# Patient Record
Sex: Male | Born: 1935 | Race: White | Hispanic: No | Marital: Married | Smoking: Former smoker
Health system: Southern US, Community
[De-identification: ages and names within clinical notes are randomized; demographics above are authoritative.]

## PROBLEM LIST (undated history)

## (undated) DIAGNOSIS — S72009A Fracture of unspecified part of neck of unspecified femur, initial encounter for closed fracture: Secondary | ICD-10-CM

## (undated) DIAGNOSIS — I10 Essential (primary) hypertension: Secondary | ICD-10-CM

## (undated) DIAGNOSIS — I509 Heart failure, unspecified: Secondary | ICD-10-CM

## (undated) DIAGNOSIS — I2510 Atherosclerotic heart disease of native coronary artery without angina pectoris: Secondary | ICD-10-CM

## (undated) DIAGNOSIS — F32A Depression, unspecified: Secondary | ICD-10-CM

## (undated) DIAGNOSIS — M1990 Unspecified osteoarthritis, unspecified site: Secondary | ICD-10-CM

## (undated) DIAGNOSIS — I214 Non-ST elevation (NSTEMI) myocardial infarction: Secondary | ICD-10-CM

## (undated) DIAGNOSIS — E7800 Pure hypercholesterolemia, unspecified: Secondary | ICD-10-CM

## (undated) DIAGNOSIS — D649 Anemia, unspecified: Secondary | ICD-10-CM

## (undated) DIAGNOSIS — K9189 Other postprocedural complications and disorders of digestive system: Secondary | ICD-10-CM

## (undated) DIAGNOSIS — G8929 Other chronic pain: Secondary | ICD-10-CM

## (undated) DIAGNOSIS — T148XXA Other injury of unspecified body region, initial encounter: Secondary | ICD-10-CM

## (undated) DIAGNOSIS — Z87891 Personal history of nicotine dependence: Secondary | ICD-10-CM

## (undated) DIAGNOSIS — R931 Abnormal findings on diagnostic imaging of heart and coronary circulation: Secondary | ICD-10-CM

## (undated) DIAGNOSIS — K567 Ileus, unspecified: Secondary | ICD-10-CM

## (undated) DIAGNOSIS — I4510 Unspecified right bundle-branch block: Secondary | ICD-10-CM

## (undated) DIAGNOSIS — D509 Iron deficiency anemia, unspecified: Secondary | ICD-10-CM

## (undated) DIAGNOSIS — F329 Major depressive disorder, single episode, unspecified: Secondary | ICD-10-CM

## (undated) DIAGNOSIS — I499 Cardiac arrhythmia, unspecified: Secondary | ICD-10-CM

## (undated) DIAGNOSIS — M549 Dorsalgia, unspecified: Secondary | ICD-10-CM

## (undated) DIAGNOSIS — K5909 Other constipation: Secondary | ICD-10-CM

## (undated) DIAGNOSIS — E871 Hypo-osmolality and hyponatremia: Secondary | ICD-10-CM

## (undated) DIAGNOSIS — K7460 Unspecified cirrhosis of liver: Secondary | ICD-10-CM

## (undated) DIAGNOSIS — R002 Palpitations: Secondary | ICD-10-CM

## (undated) DIAGNOSIS — C679 Malignant neoplasm of bladder, unspecified: Secondary | ICD-10-CM

## (undated) DIAGNOSIS — L0390 Cellulitis, unspecified: Secondary | ICD-10-CM

## (undated) DIAGNOSIS — E785 Hyperlipidemia, unspecified: Secondary | ICD-10-CM

## (undated) HISTORY — DX: Depression, unspecified: F32.A

## (undated) HISTORY — PX: ANKLE SURGERY: SHX546

## (undated) HISTORY — DX: Pure hypercholesterolemia, unspecified: E78.00

## (undated) HISTORY — DX: Major depressive disorder, single episode, unspecified: F32.9

## (undated) HISTORY — DX: Atherosclerotic heart disease of native coronary artery without angina pectoris: I25.10

## (undated) HISTORY — DX: Other postprocedural complications and disorders of digestive system: K91.89

## (undated) HISTORY — PX: HERNIA REPAIR: SHX51

## (undated) HISTORY — DX: Other injury of unspecified body region, initial encounter: T14.8XXA

## (undated) HISTORY — DX: Cardiac arrhythmia, unspecified: I49.9

## (undated) HISTORY — DX: Unspecified osteoarthritis, unspecified site: M19.90

## (undated) HISTORY — PX: HIP SURGERY: SHX245

## (undated) HISTORY — DX: Iron deficiency anemia, unspecified: D50.9

## (undated) HISTORY — DX: Other constipation: K59.09

## (undated) HISTORY — DX: Malignant neoplasm of bladder, unspecified: C67.9

## (undated) HISTORY — DX: Anemia, unspecified: D64.9

## (undated) HISTORY — DX: Essential (primary) hypertension: I10

## (undated) HISTORY — DX: Heart failure, unspecified: I50.9

## (undated) HISTORY — DX: Fracture of unspecified part of neck of unspecified femur, initial encounter for closed fracture: S72.009A

## (undated) HISTORY — DX: Abnormal findings on diagnostic imaging of heart and coronary circulation: R93.1

## (undated) HISTORY — DX: Personal history of nicotine dependence: Z87.891

## (undated) HISTORY — DX: Dorsalgia, unspecified: M54.9

## (undated) HISTORY — DX: Ileus, unspecified: K56.7

## (undated) HISTORY — DX: Hyperlipidemia, unspecified: E78.5

## (undated) HISTORY — DX: Hypo-osmolality and hyponatremia: E87.1

## (undated) HISTORY — DX: Other chronic pain: G89.29

## (undated) HISTORY — DX: Unspecified right bundle-branch block: I45.10

## (undated) HISTORY — DX: Palpitations: R00.2

## (undated) HISTORY — DX: Cellulitis, unspecified: L03.90

---

## 1998-03-23 ENCOUNTER — Other Ambulatory Visit: Admission: RE | Admit: 1998-03-23 | Discharge: 1998-03-23 | Payer: Self-pay | Admitting: Cardiology

## 2003-09-06 ENCOUNTER — Encounter (INDEPENDENT_AMBULATORY_CARE_PROVIDER_SITE_OTHER): Payer: Self-pay

## 2003-09-06 ENCOUNTER — Inpatient Hospital Stay (HOSPITAL_COMMUNITY): Admission: EM | Admit: 2003-09-06 | Discharge: 2003-09-20 | Payer: Self-pay

## 2004-02-08 ENCOUNTER — Inpatient Hospital Stay (HOSPITAL_COMMUNITY): Admission: EM | Admit: 2004-02-08 | Discharge: 2004-02-12 | Payer: Self-pay | Admitting: Emergency Medicine

## 2004-02-09 ENCOUNTER — Encounter: Payer: Self-pay | Admitting: Cardiology

## 2009-11-16 ENCOUNTER — Ambulatory Visit (HOSPITAL_COMMUNITY)
Admission: RE | Admit: 2009-11-16 | Discharge: 2009-11-17 | Payer: Self-pay | Source: Home / Self Care | Admitting: Urology

## 2009-11-16 ENCOUNTER — Encounter (INDEPENDENT_AMBULATORY_CARE_PROVIDER_SITE_OTHER): Payer: Self-pay | Admitting: Urology

## 2010-05-15 ENCOUNTER — Ambulatory Visit (HOSPITAL_COMMUNITY): Admission: RE | Admit: 2010-05-15 | Discharge: 2010-05-16 | Payer: Self-pay | Admitting: Urology

## 2010-08-01 ENCOUNTER — Ambulatory Visit: Payer: Self-pay | Admitting: Cardiology

## 2010-11-13 ENCOUNTER — Ambulatory Visit: Payer: Self-pay | Admitting: Cardiology

## 2011-01-06 LAB — SURGICAL PCR SCREEN
MRSA, PCR: NEGATIVE
Staphylococcus aureus: POSITIVE — AB

## 2011-01-06 LAB — CBC
HCT: 36.4 % — ABNORMAL LOW (ref 39.0–52.0)
Hemoglobin: 11.7 g/dL — ABNORMAL LOW (ref 13.0–17.0)
MCH: 24.3 pg — ABNORMAL LOW (ref 26.0–34.0)
MCHC: 32.1 g/dL (ref 30.0–36.0)
MCV: 75.6 fL — ABNORMAL LOW (ref 78.0–100.0)
Platelets: 174 10*3/uL (ref 150–400)
RBC: 4.81 MIL/uL (ref 4.22–5.81)
RDW: 16.9 % — ABNORMAL HIGH (ref 11.5–15.5)
WBC: 5.4 10*3/uL (ref 4.0–10.5)

## 2011-01-07 LAB — BASIC METABOLIC PANEL
BUN: 16 mg/dL (ref 6–23)
CO2: 31 mEq/L (ref 19–32)
Calcium: 9.5 mg/dL (ref 8.4–10.5)
Chloride: 101 mEq/L (ref 96–112)
Creatinine, Ser: 1.04 mg/dL (ref 0.4–1.5)
GFR calc Af Amer: >60 mL/min (ref >60)
GFR calc non Af Amer: >60 mL/min (ref >60)
Glucose, Bld: 103 mg/dL — ABNORMAL HIGH (ref 70–99)
Potassium: 4.9 mEq/L (ref 3.5–5.1)
Sodium: 139 mEq/L (ref 135–145)

## 2011-01-07 LAB — CBC
HCT: 38.7 % — ABNORMAL LOW (ref 39.0–52.0)
Hemoglobin: 12.3 g/dL — ABNORMAL LOW (ref 13.0–17.0)
MCHC: 31.7 g/dL (ref 30.0–36.0)
MCV: 77.3 fL — ABNORMAL LOW (ref 78.0–100.0)
Platelets: 170 10*3/uL (ref 150–400)
RBC: 5.01 MIL/uL (ref 4.22–5.81)
RDW: 15.6 % — ABNORMAL HIGH (ref 11.5–15.5)
WBC: 5.3 10*3/uL (ref 4.0–10.5)

## 2011-02-27 ENCOUNTER — Telehealth: Payer: Self-pay | Admitting: Cardiology

## 2011-02-27 NOTE — Telephone Encounter (Signed)
If you have any questions please call [phone]. The three prescriptions go ahead and fax in the order. Call into the drug store for a 14 day supply for the Limbotrol and the potassium. He takes two pills a day (morning and night) of each. If this doesn't make sense please call back.

## 2011-02-27 NOTE — Telephone Encounter (Signed)
Will fill when Dr. Patty Sermons is back Monday secondary to the limbitrol

## 2011-03-05 ENCOUNTER — Other Ambulatory Visit: Payer: Self-pay | Admitting: *Deleted

## 2011-03-05 DIAGNOSIS — I509 Heart failure, unspecified: Secondary | ICD-10-CM

## 2011-03-05 DIAGNOSIS — F419 Anxiety disorder, unspecified: Secondary | ICD-10-CM

## 2011-03-05 MED ORDER — CHLORDIAZEPOXIDE-AMITRIPTYLINE 10-25 MG PO TABS: 1.0000 | ORAL_TABLET | Freq: Two times a day (BID) | ORAL | Status: DC

## 2011-03-05 MED ORDER — NITROGLYCERIN 0.4 MG/HR TD PT24: 1.0000 | MEDICATED_PATCH | Freq: Every day | TRANSDERMAL | Status: DC

## 2011-03-05 MED ORDER — POTASSIUM CHLORIDE 10 MEQ PO TBCR: 10.0000 meq | EXTENDED_RELEASE_TABLET | Freq: Two times a day (BID) | ORAL | Status: DC

## 2011-03-05 NOTE — Telephone Encounter (Signed)
Faxed refills for KCL,Minitran patch,limbitrol to [facility]

## 2011-03-09 NOTE — Discharge Summary (Signed)
NAME:  Trevor [name], Trevor [name]                        ACCOUNT NO.:  [id number]   MEDICAL RECORD NO.:  [id number]                   PATIENT TYPE:  INP   LOCATION:  0470                                 FACILITY:  [facility]   PHYSICIAN:  Almedia Balls. Ranell Patrick, M.D.              DATE OF BIRTH:  01-20-1925   DATE OF ADMISSION:  02/08/2004  DATE OF DISCHARGE:  02/12/2004                                 DISCHARGE SUMMARY   ADMISSION DIAGNOSES:  1. Chronic cellulitis right ankle.  2. Hypertension.  3. History of myocardial infarction.  4. Anemia.   DISCHARGE DIAGNOSES:  1. Cellulitis right ankle status post hardware removal and irrigation and     debridement.  2. Hypertension.  3. History of myocardial infarction.  4. Anemia.   MEDICATIONS:  1. Keflex 500 mg q.6 h.  2. Vicodin 5 mg q.4-6 h p.r.n.  3. Robaxin 500 mg q.6 h.  4. Lipitor.  5. Hydrochlorothiazide.  6. Atenolol.  7. Aspirin.  8. Vitamins.  9. Potassium.   ALLERGIES:  MORPHINE SULFATE.   BRIEF HISTORY:  Trevor [name] is a 77 year old male comes into the emergency  room complaining about right ankle pain and some chronic cellulitis.  The  patient had had an ankle fusion status post a right ankle fracture.  He was  complaining about some chronic cellulitis especially medially since that  time.  The patient denies any decrease in appetite or fevers or chronic  illnesses, just only the chronic nonhealing wound to his right ankle.  The  patient has been treated for an episode previously within the past.   LABORATORY DATA:  On April 19th white blood count was 10.6, H&H was 10.5 and  32.2 and platelet count was 204.  Chemistries:  Sodium 136, potassium 3.8,  chloride 106, bicarb 24, BUN 13, creatinine 1, glucose 117.  On April 21st  white blood count was 9, H&H was 10.3 and 32.3 and a glucose was 194.  Chemistries:  Sodium 132, potassium 3.9, chloride 104, bicarb 26, BUN 10,  creatinine 1.2 and a glucose of 122.   PROCEDURE:  The  patient had a right ankle irrigation and debridement and  also hardware removal on February 09, 2004.  Surgeon was Dr. Malon Kindle,  assistant Alphonsa Overall, P.A.-C.  Spinal anesthesia was used.  Fluid  replacement was 200 mL.  Minimal blood loss.  Cultures and gram stains were  sent.  Hardware was removed.  Perioperative antibiotics were given.   HOSPITAL COURSE:  The patient presented to the emergency room on February 08, 2004 complaining about right ankle pain and right ankle cellulitis that has  been going on chronically.  The patient was admitted for I&D of this wound  and possible hardware removal.  The patient has had a previous episode in  the past of cellulitis in the nonhealing wound but no attempt at removing  the hardware has been completed.  The patient had the above-stated procedure  on February 09, 2004 with no complication.  The patient tolerated the procedure  well and was transported up to [facility] [place] after an adequate time in the  postanesthesia care unit.  Cardiology assisted [place] with management of this  patient due to his history of previous MI around surgery.  The patient had  negative cardiac labs essentially and also a negative 2-D echo only showing  his old MI.  The patient remained afebrile and alert and oriented throughout  his stay and his labs were monitored.  Dressing changes were completed  daily.  The patient tolerated his hospital stay very well.  The patient was  given a wheelchair for home use and discharged with nonweightbearing for his  right lower extremity with some occupational therapy and physical therapy  help.   DISCHARGE PLANNING:  The patient was discharged on February 12, 2004.   CONDITION:  Improved.   DIET:  Regular.   ACTIVITY:  Nonweightbearing to right lower extremity until follow up.   FOLLOW UP:  In 7 to 10 days with Dr. Ranell Patrick in his office.   CONSULTATIONS:  Cardiology, Dr. Quita Skye. [place].     Thomas B. Durwin Nora, P.A.                      Almedia Balls. Ranell Patrick, M.D.    TBD/MEDQ  D:  03/15/2004  T:  03/15/2004  Job:  [id number]

## 2011-03-09 NOTE — Consult Note (Signed)
NAME:  Trevor [name], Trevor [name]                        ACCOUNT NO.:  [id number]   MEDICAL RECORD NO.:  [id number]                   PATIENT TYPE:  INP   LOCATION:  0470                                 FACILITY:  [facility]   PHYSICIAN:  Quita Skye. Waldon Reining, MD             DATE OF BIRTH:  [birth year]   DATE OF CONSULTATION:  02/08/2004  DATE OF DISCHARGE:                                   CONSULTATION   Trevor [name] is a 77 year old white man who is being admitted for surgical  treatment of an infected, chronic nonhealing right ankle wound.  Cardiology  consultation was requested prior to surgery.   The patient has a history of having suffered an inferior myocardial  infarction in 1984.  This was complicated by hypotension and repeated  episodes of ventricular fibrillation requiring electrical cardioversion.  During the ensuing years he did well from a cardiac standpoint, experiencing  infrequent episodes of chest pain.  In November 2004 he was hospitalized for  a strangulated right inguinal hernia.  He underwent exploratory laparotomy,  partial right colectomy, omentectomy, and a right inguinal hernia repair.  He suffered a non-ST segment elevation myocardial infarction  postoperatively.  He has continued to do well from a cardiac standpoint  since then.  He continues to experience infrequent episodes of chest pain  relieved with nitroglycerin.   The patient has no history of congestive heart failure or arrhythmia.   He does have a history of hypertension and dyslipidemia, both of which are  being treated.  There is no history of diabetes mellitus.  He stopped  smoking 20 years ago.   The patient is on a number of medications.  These include Lipitor,  hydrochlorothiazide, atenolol, aspirin, and potassium chloride.   He is reportedly allergic to MORPHINE.   The patient is retired.  He lives with his wife.  He drinks an occasional  glass of wine.   Previous operations besides those  described above include bilateral hernia  repairs, right hip surgery, and right ankle surgery.   The patient experienced a single, brief (two to three minutes) episode of  chest pain this evening, relieved with one nitroglycerin tablet.   REVIEW OF SYSTEMS:  No new problems related to head, eyes, ears, nose,  mouth, throat, lungs, gastrointestinal system, genitourinary system, or  extremities.  There is no history of neurologic or psychiatric disorder.  There is no history of fever, chills, or weight loss.   PHYSICAL EXAMINATION:  VITAL SIGNS:  Blood pressure 106/62.  Pulse 81 and  regular.  Respirations 20.  Temperature 99.3  GENERAL:  The patient was an older white man in no discomfort.  He was  alert, oriented, appropriate, and responsive.  HEENT:  Head, eyes, nose, and mouth were normal.  NECK:  Without thyromegaly or adenopathy.  Carotid pulses were palpable  bilaterally and without bruits.  CARDIAC:  Normal S1 and S2.  There was no S3,  S4, murmur, rub, or click.  Cardiac rhythm was regular.  No chest wall tenderness was noted.  CHEST:  The lungs were clear.  ABDOMEN:  Soft and nontender.  There was no mass, hepatosplenomegaly, bruit,  distention, rebound, guarding, or rigidity.  Bowel sounds were normal.  RECTAL, GENITAL:  Exams were not performed as they were not pertinent to the  reason for acute care hospitalization.  EXTREMITIES:  The left lower extremity was without deviation or deformity.  Dorsalis pedal pulse was palpable.  Radial pulses were palpable bilaterally.  The right foot was not examined as it was bandaged.   Electrocardiogram revealed normal sinus rhythm with occasional PVCs.  There  was evidence of a prior inferior myocardial infarction.  There were  nonspecific ST and T-wave changes in the anterolateral leads.  The tracing  was otherwise unremarkable.  Chest radiograph report was pending at the time  of this dictation.  White count was 10.6 with a hemoglobin  of 10.5 and a  hematocrit of 32.2.  Potassium 3.8 with a BUN of 13 and creatinine of 1.0.  The remaining studies were pending at the time of dictation.   IMPRESSION:  1. Coronary artery disease.  Status post inferior myocardial infarction in     1984.  Status post postoperative non-Q-wave myocardial infarction in     November 2004 following exploratory laparotomy.  2. Hypertension.  3. Dyslipidemia.  4. Infected, chronic nonhealing wound, right ankle.  5. Chronic anemia.   RECOMMENDATIONS:  1. The patient is at an increased risk of cardiovascular complications from     the planned surgery; however, there is no evidence of ischemia,     congestive heart failure, or arrhythmia/conduction disturbance at this     time.  2. Nitrol paste.  3. Additional beta blocker.  4. Maintain potassium between 4.0 and 5.0.  5. Careful fluid monitoring.  6. Echocardiogram to assess left ventricular function.  7. Postop cardiac enzymes.   Dr. Patty Sermons will follow the patient with you.                                               Quita Skye. Waldon Reining, MD    MSC/MEDQ  D:  02/08/2004  T:  02/09/2004  Job:  [id number]   cc:   Cassell Clement, M.D.  [address], [address]  [place]  [place] [address]  Fax: [phone]   Almedia Balls. Ranell Patrick, M.D.  [facility]  [address]  [address]  [place]  [place] [address]  Fax: [phone]

## 2011-03-09 NOTE — Op Note (Signed)
NAME:  Trevor [name], Trevor [name]                        ACCOUNT NO.:  [id number]   MEDICAL RECORD NO.:  [id number]                   PATIENT TYPE:  INP   LOCATION:  0357                                 FACILITY:  [facility]   PHYSICIAN:  Adolph Pollack, M.D.            DATE OF BIRTH:  1925-03-25   DATE OF PROCEDURE:  09/06/2003  DATE OF DISCHARGE:                                 OPERATIVE REPORT   PREOPERATIVE DIAGNOSIS:  Incarcerated, recurrent right inguinal hernia.   POSTOPERATIVE DIAGNOSIS:  Strangulated recurrent right inguinal hernia (part  of ascending colon).   PROCEDURE:  1. Exploratory laparotomy with partial right colectomy.  2. Repair of recurrent right inguinal hernia with AlloDerm graft.   SURGEON:  Adolph Pollack, M.D.   ASSISTANT:  Kristine Garbe. Ezzard Standing, M.D.   ANESTHESIA:  General.   FINDINGS:  A recurrent right inguinal hernia, proximal half of the right  colon, which had patchy areas of infarction and strangulation.   INDICATIONS:  This 77 year old male has a history of recurrent right  inguinal hernias.  He has a known recurrence and then got more painful and  swollen and red over the weekend.  He now presents for emergency repair of  this incarcerated hernia.   TECHNIQUE:  He was brought to the operating room and placed supine on the  operating table, and general anesthetic was administered.  The Foley  catheter was placed in his bladder.  The abdominal wall and scrotal area  were sterilely prepped and draped.  A previous right groin incision was re  incised through the skin and subcutaneous tissue and attenuated external  oblique aponeurosis, and underneath, a large, firm mass was noted.  I  dissected some adhesive tissue away from this.  I then entered the sac and  had dark, bloody fluid evacuated from it.  There appeared to be some  incarcerated omentum.  I then felt the hernia defect and opened it up a  little bit medially.  I then reduced what appeared  to be the proximal half  of the right colon, which was purple and had black areas consistent with  patchy infarction.  I was unable to completely redo this back in the  abdominal cavity but decided I was going to need to do a partial bowel  resection.   At this time, I went ahead and performed a lower midline incision through  the skin and subcutaneous tissue, fascia, and peritoneum.  Omental adhesions  were taken down with electrocautery.  I then was able to reduce the bowel  portion of the hernia into the peritoneal cavity.  The omental portion that  was infarcted, I went ahead and resected this and sent it as a partial  omentectomy specimen.   I then mobilized the distal ileum and the ascending colon.  The proximal  half of the ascending colon had evidence of infarction.  I went ahead and  divided  the ileum just proximal to the ileocecal valve and divided the right  colon at its midportion at a viable area.  The mesentery was then divided  between clamps and the vessels ligated.  The specimen was sent out to  pathology, along with part of the omentum.  A side-to-side stapled  anastomosis was then performed with the endo-GIA stapler.  The remaining  enterotomy was closed adequately with a linear non cutting stapler.  The  mesenteric defect was closed with interrupted 3-0 Vicryl sutures.  The  anastomosis was patent, viable, and under no tension.  The distal staple  area was reinforced with a single 3-0 Vicryl suture.   At this point, gloves were changed.  The abdominal cavity was irrigated with  2 liters of saline solution, and hemostasis was adequate.  I then re  approached the hernia site.  I was able to identify the pubic tubercle, the  symphysis pubis, and Cooper's ligament.  I then performed a Bassini repair  by approximating the shelving edge of the inguinal ligament and Cooper's  ligament to the internal oblique fascia and muscle, attenuating  transversalis fascia with  interrupted 2-0 Prolene sutures.  I tacked down  the internal ring.  I had identified some of the cord structures.  I then  placed a piece of AlloDerm graft, measuring 4 cm x 12 cm initially and  stretched it out.  I placed it as an onlay over the primary repair with a  running 2-0 Prolene suture, anchoring it to the symphysis pubis and then to  the internal oblique muscle and the external oblique aponeurosis/inguinal  ligament area near the shelving edge with a running 2-0 Prolene suture.  This provided more adequate coverage.   Following this, I irrigated out this wound.  I closed the Scarpa's fascia  over the mesh with a running 3-0 Vicryl suture.  I then made sure that the  sponge, needle, and instrument counts were correct.  I closed the midline  wound fascia with a running #1 PDS suture.  Both wounds were irrigated in  the subcutaneous space, and the skin of both wounds was closed with staples.  A sterile dressing was applied.   He tolerated the procedure well without any apparent complications.  He  subsequently was taken to the recovery room in satisfactory condition.                                               Adolph Pollack, M.D.    Kari Baars  D:  09/06/2003  T:  09/06/2003  Job:  [id number]   cc:   Cassell Clement, M.D.  [address]  [place]  [place] [address]  Fax: [phone]

## 2011-03-09 NOTE — H&P (Signed)
NAME:  Trevor [name], Trevor [name]                        ACCOUNT NO.:  [id number]   MEDICAL RECORD NO.:  [id number]                   PATIENT TYPE:  AMB   LOCATION:  OMED                                 FACILITY:  [facility]   PHYSICIAN:  Adolph Pollack, M.D.            DATE OF BIRTH:  February 02, 1925   DATE OF ADMISSION:  09/06/2003  DATE OF DISCHARGE:                                HISTORY & PHYSICAL   REASON FOR ADMISSION:  Incarcerated right inguinal hernia.   HISTORY OF PRESENT ILLNESS:  Trevor [name] is a 77 year old male who has had  two previous right inguinal hernia repairs.  He had known about this  recurrence for some time, but over the weekend he said that it got more firm  and extremely painful.  He presented to Dr. Patty Sermons today, who sent him up  to our office and now he is sent to [facility] because of  incarcerated hernia with erythematous changes.  He has had some abdominal  pain with this.  No definite nausea or vomiting.  He has been constipated  since Saturday and passed just a little gas.  He had some chills last night.   PAST MEDICAL HISTORY:  1. Hypertension.  2. Coronary artery disease.  3. Myocardial infarction.  4. Arthritis.  5. Chronic nonhealing wound of the right ankle.  6. Hypercholesterolemia.   PREVIOUS OPERATIONS:  1. Bilateral hernia repairs.  2. Repair of recurrent right inguinal hernia.  3. Right hip surgery.  4. ORIF of right ankle fracture.   ALLERGIES:  He is SENSITIVE TO MORPHINE.   MEDICATIONS:  1. __________M-NIP b.i.d.  2. Atenolol.  3. Hydrochlorothiazide.  4. Nitroglycerin patch q.24h.  5. Aspirin daily.  6. Fosamax weekly.  7. Nitroglycerin tablets p.r.n.  8. Lipitor.  9. Ecotrin.   SOCIAL HISTORY:  He used to smoke tobacco, a pack a day, but quit  approximately 20 years ago.  He occasionally has a glass of wine.  He is  married.   REVIEW OF SYSTEMS:  CARDIOVASCULAR:  He has not had any problem with is  heart or chest  pain.  PULMONARY:  No asthma, COPD, or pneumonia.  NEUROLOGIC:  No strokes or seizures.  ENDOCRINE:  No diabetes or thyroid  disease.  GASTROINTESTINAL:  No hepatitis, peptic ulcer disease, or  diverticulitis.  GENITOURINARY:  He sometimes has a little trouble starting  his stream, but no known BPH.   PHYSICAL EXAMINATION:  GENERAL APPEARANCE:  An elderly male who appears to  be somewhat uncomfortable, but is pleasant and cooperative.  VITAL SIGNS:  The temperature is 99.2 degrees, the blood pressure is 124/76,  the pulse is 69, and the respiratory rate is 20.  HEENT:  Eyes:  Extraocular motions intact.  No icterus.  NECK:  Supple without masses or obvious thyroid enlargement.  RESPIRATORY:  Breath sounds equal and clear.  Respirations unlabored.  CARDIOVASCULAR:  The heart demonstrates  a regular rate and rhythm.  No  murmurs heard.  ABDOMEN:  Soft.  It is scaphoid.  There is mild lower abdominal tenderness  and hypoactive bowel sounds noted.  GENITOURINARY:  There is a large swelling that is painful, red, and  irreducible in the right groin area with a large right scar.  There is also  a left groin scar.  The right testicle is riding somewhat higher in the  scrotum.  The left testicle is down.  EXTREMITIES:  On the medial aspect of the right ankle there is a small dried  wound with a bandage on it.  No edema noted.   IMPRESSION:  1. Incarcerated right inguinal hernia.  2. Coronary artery disease.  3. Hypertension.   PLAN:  To the operating room for emergency repair of an incarcerated right  inguinal hernia and possible bowel resection if strangulation is present.  I  did go over the procedure and the risks with the patient and his wife.  The  risks include, but are not limited to bleeding, infection, recurrence of the  hernia, accidental nerve damage and numbness or pain, the possibility of  right orchiectomy, the possibility of bowel resection, and cardiopulmonary  complications  of anesthesia.  He seems to understand all of these and is  agreeable to proceeding.                                               Adolph Pollack, M.D.    Kari Baars  D:  09/06/2003  T:  09/06/2003  Job:  [id number]

## 2011-03-09 NOTE — Op Note (Signed)
NAME:  Trevor [name], HERITAGE                        ACCOUNT NO.:  [id number]   MEDICAL RECORD NO.:  [id number]                   PATIENT TYPE:  INP   LOCATION:  0470                                 FACILITY:  [facility]   PHYSICIAN:  Almedia Balls. Ranell Patrick, M.D.              DATE OF BIRTH:  03-06-1925   DATE OF PROCEDURE:  02/09/2004  DATE OF DISCHARGE:                                 OPERATIVE REPORT   PREOPERATIVE DIAGNOSIS:  Right ankle infection with retained hardware.   POSTOPERATIVE DIAGNOSIS:  Right ankle infection with retained hardware.   PROCEDURE:  Right ankle incision and drainage and hardware removal.   SURGEON:  Almedia Balls. Ranell Patrick, M.D.   ASSISTANT:  Donnie Coffin. Dixon, PA-C   ANESTHESIA:  Spinal.   ESTIMATED BLOOD LOSS:  Minimal.   TOURNIQUET TIME:  Not used.   FLUID REPLACEMENT:  Crystalloid 200 cc.   INSTRUMENT COUNTS:  Correct.   COMPLICATIONS:  None.   INDICATIONS:  Patient is a 77 year old male who presents with fluctuant,  erythematous, painful ankle.  Patient has had a prior ankle surgery,  including ankle fusion and has retained an 18-gauge stainless steel wire  present over the medial malleolus.  This appears to be irritating and  potentially undermining the skin and causing recurrent infections that he  has had over the past several years.  After discussing this with the family  and patient, in light of the patient's high temperature, we would like to  proceed with cardiac clearance and approval to surgery for I&D and hardware  removal.  Patient signed a consent form.   DESCRIPTION OF PROCEDURE:  After an adequate level of anesthesia was  achieved, the patient was positioned supinely on the operating room table.  We did use spinal anesthesia.  The patient remained stable during surgery.  A sterile, nonsurgical tourniquet was placed in the right proximal thigh.  The right leg was thoroughly prepped and draped.  A longitudinal skin  incision was created and  incorporated into the patient's undermined skin  over the medial malleolus.  This measured about 2 inches.  This was taken  sharply down to the bone.  There was gross purulence noted in the region of  the hardware.  This was cultured, Gram's stain culture.  We then thoroughly  irrigated and debrided any nonviable tissue and removed the stainless steel  hardware, took an x-ray, verified it was out, and then closed about half the  incision and packed the rest of it with saline-soaked gauze.  This was a  moist-to-dry dressing,  followed by a short leg splint, posterior splint with no pressure over the  medial side.  The patient's skin actually looked quite good after finishing  this.  There was no skin defect noted.  We are hopeful we will not have skin  complications.  We are going to be following closely the cultures and the  Gram's stain.  Almedia Balls. Ranell Patrick, M.D.    SRN/MEDQ  D:  02/09/2004  T:  02/10/2004  Job:  [id number]

## 2011-03-09 NOTE — Discharge Summary (Signed)
NAME:  [name], Trevor                        ACCOUNT NO.:  [id number]   MEDICAL RECORD NO.:  [id number]                   PATIENT TYPE:  INP   LOCATION:  0364                                 FACILITY:  [facility]   PHYSICIAN:  Adolph Pollack, M.D.            DATE OF BIRTH:  [birth year]   DATE OF ADMISSION:  09/06/2003  DATE OF DISCHARGE:  09/20/2003                                 DISCHARGE SUMMARY   PRINCIPAL DISCHARGE DIAGNOSIS:  Strangulated recurrent right inguinal  hernia.   SECONDARY DIAGNOSES:  1. Postoperative myocardial infarction.  2. Postoperative ileus.  3. Hypertension.  4. Coronary artery disease.  5. Chronic non-healing wound, right ankle.  6. Hypercholesterolemia.  7. Hyponatremia.  8. Postoperative hypovolemia.  9. Wound infection.  10.      Hypokalemia.  11.      Deconditioned state.  12.      Acute blood loss anemia.   REASON FOR ADMISSION:  This is a 77 year old male with two previous right  inguinal hernia repairs.  He had known recurrence and had been delaying  getting it fixed.  He noticed increased firmness and pain, saw Dr. Patty Sermons  in the office, and was felt to have an incarcerated hernia, and was sent to  [facility].  He was certainly having an incarcerated right inguinal hernia,  and was admitted and taken to the operating room.   HOSPITAL COURSE:  In the operating room, he was found to have a strangulated  recurrent right inguinal hernia.  He underwent an exploratory laparotomy,  partial right colectomy, and omentectomies, and a right inguinal hernia  repair with an AlloDerm graft.  Postoperatively, he was somewhat hypovolemic  and he was given a fluid bolus, mobilized.  He was started on some liquids  slowly.  The patient had some ventricular tachycardia noted, and a cardiac  panel was performed.  A significant increase in his troponin was noted.  It  appeared he had a perioperative MI.  Dr. Ronny Flurry was called to see him  and he was  started on Lovenox and treated medically for this.  He continued  to have somewhat of a postoperative ileus; however, he was stable enough to  be transferred from the subacute care unit to [facility].  He was fairly  weak and the ileus slowly resolved.  His diet was advanced by the sixth  postoperative day.  He was noticed to start having some fever, and upon  examination the midline wound appeared edematous.  The wound was opened up  and a purulent material drained out.  Dressing changes and IV antibiotics  were started.  He continued to be somewhat deconditioned, so we gently  increased his activity.  Dressing changes were occurring.  His IV antibiotic  was eventually able to be discontinued.  He continued to gain some strength,  and by his fourteenth postoperative day he felt much better and was ready to  be discharged.   DISPOSITION:  Discharged to home on postoperative day #14.  He is on a beta  blocker at home and aspirin, and he will continue on this as well as with  his nitroglycerin patch and his hydrochlorothiazide.  He is given strict  activity  restrictions, and nurse will come and help him change his dressings.  He  will see Dr. Elease Hashimoto one week after discharge.  He is to come back to see me  in approximately two weeks after discharge.   CONDITION ON DISCHARGE:  Satisfactory.                                               Adolph Pollack, M.D.    Kari Baars  D:  10/01/2003  T:  10/01/2003  Job:  [id number]   cc:   Cassell Clement, M.D.  [address], [address]  [place]  [place] [address]  Fax: [phone]

## 2011-03-20 ENCOUNTER — Ambulatory Visit (INDEPENDENT_AMBULATORY_CARE_PROVIDER_SITE_OTHER): Payer: Medicare Other | Admitting: Cardiology

## 2011-03-20 ENCOUNTER — Encounter: Payer: Self-pay | Admitting: Cardiology

## 2011-03-20 DIAGNOSIS — E7800 Pure hypercholesterolemia, unspecified: Secondary | ICD-10-CM

## 2011-03-20 DIAGNOSIS — D509 Iron deficiency anemia, unspecified: Secondary | ICD-10-CM

## 2011-03-20 DIAGNOSIS — I259 Chronic ischemic heart disease, unspecified: Secondary | ICD-10-CM

## 2011-03-20 DIAGNOSIS — Z8551 Personal history of malignant neoplasm of bladder: Secondary | ICD-10-CM

## 2011-03-20 DIAGNOSIS — I119 Hypertensive heart disease without heart failure: Secondary | ICD-10-CM

## 2011-03-20 NOTE — Assessment & Plan Note (Signed)
This patient has a history of known ischemic heart disease.  He has a history of a remote inferior wall myocardial infarction complicated by ventricular arrhythmias and congestive heart failure.  He had a prolonged hospital stay at the time of his myocardial infarction which was in 1984.  He had to retire from his job after his myocardial infarction.  Recently he has been stable with no recent angina pectoris.  He does take 2 sublingual much glycerin is at bedtime on a routine basis.  He also wears a nitroglycerin patch.  His activity is relatively sedentary because of a lot of orthopedic problems with his back.

## 2011-03-20 NOTE — Assessment & Plan Note (Signed)
The patient has a history of hypercholesterolemia.  He is on Lipitor 20 mg daily.  Is not having any myalgias or other side effects from the Lipitor.

## 2011-03-20 NOTE — Progress Notes (Signed)
Trevor [name] Date of Birth:  [birth year] [name] [address]   [address] [place], [place]  [address] [phone]           Fax   [phone]  History of Present Illness: This pleasant 77 year old gentleman is seen for a scheduled followup office visit.  He has a history of known ischemic heart disease.  He had a remote inferior wall myocardial infarction in 1984 complicated by recurrent ventricular fibrillation requiring multiple shocks, cardiogenic shock, and congestive heart failure.  He extended his infarct made it impossible for him to return to work and he had to medically retire after his heart attack.  Recently he's not expressing H. Exertional chest pain or shortness of breath.  He is inhibited taking sublingual nitroglycerin at bedtime and continues to do that.  He's not having symptoms of congestive heart failure.  He's had no dizziness or syncope.  Current Outpatient Prescriptions  Medication Sig Dispense Refill  . amitriptyline-chlordiazePOXIDE (LIMBITROL DS) 10-25 MG TABS Take 1 tablet by mouth 2 (two) times daily.  180 tablet  3  . aspirin 325 MG EC tablet Take 325 mg by mouth daily.        [name] atenolol (TENORMIN) 25 MG tablet Take 25 mg by mouth daily.        [name] atorvastatin (LIPITOR) 20 MG tablet Take 20 mg by mouth daily.        . calcium-vitamin D (OSCAL) 250-125 MG-UNIT per tablet Take 1 tablet by mouth daily.        . clopidogrel (PLAVIX) 75 MG tablet Take 75 mg by mouth daily.        . hydrochlorothiazide 25 MG tablet Take 25 mg by mouth daily.        [name] HYDROcodone-acetaminophen (VICODIN) 5-500 MG per tablet Take 1 tablet by mouth every 8 (eight) hours as needed.        . multivitamin (THERAGRAN) per tablet Take 1 tablet by mouth daily.        . nitroGLYCERIN (NITRODUR - DOSED IN MG/24 HR) 0.4 mg/hr Place 1 patch (0.4 mg total) onto the skin daily.  90 patch  3  . potassium chloride (KLOR-CON) 10 MEQ CR tablet Take 1 tablet (10 mEq total)  by mouth 2 (two) times daily.  180 tablet  3  . Tamsulosin HCl (FLOMAX) 0.4 MG CAPS Take by mouth.          Allergies  Allergen Reactions  . Morphine And Related     Patient Active Problem List  Diagnoses  . Ischemic heart disease  . Hypercholesterolemia  . Benign hypertensive heart disease without heart failure  . Iron deficiency anemia  . Hx of bladder cancer    History  Smoking status  . Former Smoker -- 1.0 packs/day for 26 years  . Types: Cigarettes  . Quit date: 08/17/1983  Smokeless tobacco  . Former Neurosurgeon  . Types: Chew    History  Alcohol Use No    Family History  Problem Relation Age of Onset  . Heart disease Father   . Heart attack Father   . Emphysema Brother   . Coronary artery disease Sister     X5 CABG still living  . Heart failure Sister     Review of Systems: Constitutional: no fever chills diaphoresis or fatigue or change in weight.  Head and neck: no hearing loss, no epistaxis, no photophobia or visual disturbance. Respiratory: No cough, shortness of breath or wheezing.  Cardiovascular: No chest pain peripheral edema, palpitations. Gastrointestinal: No abdominal distention, no abdominal pain, no change in bowel habits hematochezia or melena. Genitourinary: No dysuria, no frequency, no urgency, no nocturia. Musculoskeletal:No arthralgias, no back pain, no gait disturbance or myalgias. Neurological: No dizziness, no headaches, no numbness, no seizures, no syncope, no weakness, no tremors. Hematologic: No lymphadenopathy, no easy bruising. Psychiatric: No confusion, no hallucinations, no sleep disturbance.    Physical Exam: Filed Vitals:   03/20/11 1051  BP: 130/76  Pulse: 74  The general appearance reveals a elderly gentleman who is kyphotic.  He is in no acute distress.Pupils equal and reactive.   Extraocular Movements are full.  There is no scleral icterus.  The mouth and pharynx are normal.  The neck is supple.  The carotids reveal no  bruits.  The jugular venous pressure is normal.  The thyroid is not enlarged.  There is no lymphadenopathy.The chest is clear to percussion and auscultation. There are no rales or rhonchi. Expansion of the chest is symmetrical.The precordium is quiet.  The first heart sound is normal.  The second heart sound is physiologically split.  There is no murmur gallop rub or click.  There is no abnormal lift or heave.The abdomen is soft and nontender. Bowel sounds are normal. The liver and spleen are not enlarged. There Are no abdominal masses. There are no bruits.The pedal pulses are good.  There is no phlebitis or edema.  There is no cyanosis or clubbing.Strength is normal and symmetrical in all extremities.  There is no lateralizing weakness.  There are no sensory deficits.  Musculoskeletal shows kyphosis.The skin is warm and dry.  There is no rash.   Assessment / Plan: Patient needs to watch his sweets.  His blood sugars have been started to run a little high.  Continue same medication.  Recheck in 4 months for office visit and fasting lab work

## 2011-03-20 NOTE — Assessment & Plan Note (Signed)
The patient has a past history of essential hypertension.  His blood pressure has been adequately controlled on present medications.  He has not had to take any recent Lasix.

## 2011-04-04 ENCOUNTER — Other Ambulatory Visit: Payer: Self-pay | Admitting: Cardiology

## 2011-04-05 NOTE — Telephone Encounter (Signed)
escribe request  

## 2011-04-09 ENCOUNTER — Other Ambulatory Visit: Payer: Self-pay | Admitting: Dermatology

## 2011-04-23 ENCOUNTER — Other Ambulatory Visit: Payer: Self-pay | Admitting: Cardiology

## 2011-04-24 NOTE — Telephone Encounter (Signed)
Med refill

## 2011-06-28 ENCOUNTER — Other Ambulatory Visit: Payer: Self-pay | Admitting: Cardiology

## 2011-06-28 NOTE — Telephone Encounter (Signed)
escribe request  

## 2011-07-20 ENCOUNTER — Ambulatory Visit
Admission: RE | Admit: 2011-07-20 | Discharge: 2011-07-20 | Disposition: A | Discharge status: Home / Self Care | Payer: Medicare Other | Source: Ambulatory Visit | Attending: Cardiology | Admitting: Cardiology

## 2011-07-20 ENCOUNTER — Ambulatory Visit (INDEPENDENT_AMBULATORY_CARE_PROVIDER_SITE_OTHER): Payer: Medicare Other | Admitting: Cardiology

## 2011-07-20 ENCOUNTER — Telehealth: Payer: Self-pay | Admitting: Cardiology

## 2011-07-20 ENCOUNTER — Encounter: Payer: Self-pay | Admitting: Cardiology

## 2011-07-20 ENCOUNTER — Other Ambulatory Visit: Payer: Medicare Other | Admitting: *Deleted

## 2011-07-20 ENCOUNTER — Telehealth: Payer: Self-pay | Admitting: *Deleted

## 2011-07-20 VITALS — BP 140/80 | HR 72 | Ht 60.0 in | Wt 190.0 lb

## 2011-07-20 DIAGNOSIS — R0602 Shortness of breath: Secondary | ICD-10-CM

## 2011-07-20 DIAGNOSIS — E785 Hyperlipidemia, unspecified: Secondary | ICD-10-CM

## 2011-07-20 DIAGNOSIS — I259 Chronic ischemic heart disease, unspecified: Secondary | ICD-10-CM

## 2011-07-20 DIAGNOSIS — Z79899 Other long term (current) drug therapy: Secondary | ICD-10-CM

## 2011-07-20 DIAGNOSIS — I509 Heart failure, unspecified: Secondary | ICD-10-CM

## 2011-07-20 DIAGNOSIS — I119 Hypertensive heart disease without heart failure: Secondary | ICD-10-CM

## 2011-07-20 DIAGNOSIS — R609 Edema, unspecified: Secondary | ICD-10-CM

## 2011-07-20 DIAGNOSIS — D509 Iron deficiency anemia, unspecified: Secondary | ICD-10-CM

## 2011-07-20 LAB — CBC WITH DIFFERENTIAL/PLATELET
Basophils Absolute: 0 10*3/uL (ref 0.0–0.1)
Basophils Relative: 0.4 % (ref 0.0–3.0)
Eosinophils Absolute: 0 10*3/uL (ref 0.0–0.7)
Eosinophils Relative: 0.4 % (ref 0.0–5.0)
HCT: 35 % — ABNORMAL LOW (ref 39.0–52.0)
Hemoglobin: 11.1 g/dL — ABNORMAL LOW (ref 13.0–17.0)
Lymphocytes Relative: 24.6 % (ref 12.0–46.0)
Lymphs Abs: 1.3 10*3/uL (ref 0.7–4.0)
MCHC: 31.7 g/dL (ref 30.0–36.0)
MCV: 77.2 fl — ABNORMAL LOW (ref 78.0–100.0)
Monocytes Absolute: 0.5 10*3/uL (ref 0.1–1.0)
Monocytes Relative: 9.9 % (ref 3.0–12.0)
Neutro Abs: 3.4 10*3/uL (ref 1.4–7.7)
Neutrophils Relative %: 64.7 % (ref 43.0–77.0)
Platelets: 120 10*3/uL — ABNORMAL LOW (ref 150.0–400.0)
RBC: 4.53 Mil/uL (ref 4.22–5.81)
RDW: 15.4 % — ABNORMAL HIGH (ref 11.5–14.6)
WBC: 5.3 10*3/uL (ref 4.5–10.5)

## 2011-07-20 LAB — TSH: TSH: 4.25 u[IU]/mL (ref 0.35–5.50)

## 2011-07-20 LAB — BRAIN NATRIURETIC PEPTIDE: Pro B Natriuretic peptide (BNP): 804 pg/mL — ABNORMAL HIGH (ref 0.0–100.0)

## 2011-07-20 MED ORDER — FUROSEMIDE 40 MG PO TABS: 40.0000 mg | ORAL_TABLET | Freq: Every day | ORAL | Status: DC

## 2011-07-20 NOTE — Telephone Encounter (Signed)
Call received from [place] at [facility] regarding an abnormal chest x-ray report from today on the patient. Findings as below: 1) no acute cardiopulmonary process. 2) stable cardiomegaly 3) increased density on lateral projection; recommend CT thorax with contrast to exclude enlarging pulmonary nodule. No previous CT's on file per Enrique Sack- last chest xray- 11/14/09.   Will forward to Dr. Patty Sermons and his nurse for review.

## 2011-07-20 NOTE — Assessment & Plan Note (Signed)
The patient has had an increase in exertional dyspnea and overall weakness since last visit.  He has not been expressing any increased chest pain, but has been more short of breath, and he has had increased peripheral edema.  We are stopping his hydrochlorothiazide and switching him to furosemide 40 mg one daily.  We're sending him for a chest x-ray today.  We are checking a B. natruretic peptide CBC hepatic function panel.  His metabolic panel and lipid panel today.

## 2011-07-20 NOTE — Patient Instructions (Signed)
Your physician recommends that you schedule a follow-up appointment in: 1 month with Lawson Fiscal and 1 week for BMET labs

## 2011-07-20 NOTE — Telephone Encounter (Signed)
Herbert Seta wants to make sure that you saw the staff/phone msg.regarding this patient. She said that she sent it earlier today.

## 2011-07-20 NOTE — Telephone Encounter (Signed)
The patient is presently very fluid overloaded and a dyspneic with bilateral rales and with abdominal distention and marked pedal edema.  His weight is up 17 pounds.  We will observe response to adding Lasix 40 mg daily and then pursue followup of the pulmonary nodule after his next office visit.

## 2011-07-20 NOTE — Assessment & Plan Note (Signed)
She does not have any dizzy spells, or headache

## 2011-07-20 NOTE — Telephone Encounter (Signed)
Dr Patty Sermons called and advised of Xray results

## 2011-07-20 NOTE — Assessment & Plan Note (Signed)
Has a past history of hematuria.  He has not had any recent observable hematuria.  He has had no hematochezia or melena.  We are checking a CBC today

## 2011-07-20 NOTE — Progress Notes (Signed)
Trevor [name] Mow Date of Birth:  08-22-1925 [facility] / [facility] [address]   [address] [place], [place]  [address] [phone]           Fax   [phone]  History of Present Illness: This pleasant 77 year old gentleman is seen for a scheduled followup office visit.  He has a history of known ischemic heart disease.  He had a remote inferior wall myocardial infarction in 1984, complicated by recurrent ventricular fibrillation requiring multiple shocks.  He had cardiogenic shock and congestive heart failure.  He made a slow, but gradual improvement.  He medically retired after his heart attack.  He had been doing well until the past month or so when he has noted a gradual, progressive weight gain.  He has gained 17 pounds since last visit.  He has been more short of breath.  He still sleeps on one pillow and has not had paroxysmal nocturnal dyspnea or orthopnea.  He does have nocturia x4.  His last echocardiogram was in 2005 and at that time showed an ejection fraction of 50-55% with akinesis of the entire inferior wall.  A pulmonary artery pressure of 45.  The patient had a lysis scan Cardiolite stress test 11/03/09.  L4 kidney surgery and had an ejection fraction of 44% with a large area of infarct involving the inferoseptal basal, inferior and inferolateral wall.  Current Outpatient Prescriptions  Medication Sig Dispense Refill  . amitriptyline-chlordiazePOXIDE (LIMBITROL DS) 10-25 MG TABS Take 1 tablet by mouth 2 (two) times daily.  180 tablet  3  . aspirin 325 MG EC tablet Take 325 mg by mouth daily.        [name] atenolol (TENORMIN) 25 MG tablet TAKE 1 TABLET BY MOUTH EVERY DAY  30 tablet  0  . atorvastatin (LIPITOR) 20 MG tablet Take 20 mg by mouth daily.        . calcium-vitamin D (OSCAL) 250-125 MG-UNIT per tablet Take 1 tablet by mouth daily.        . clopidogrel (PLAVIX) 75 MG tablet Take 75 mg by mouth daily.        . ferrous sulfate 325 (65 FE) MG tablet TAKE ONE  TABLET BY MOUTH EVERY DAY  90 tablet  3  . HYDROcodone-acetaminophen (VICODIN) 5-500 MG per tablet Take 1 tablet by mouth every 8 (eight) hours as needed.        . multivitamin (THERAGRAN) per tablet Take 1 tablet by mouth daily.        . nitroGLYCERIN (NITRODUR - DOSED IN MG/24 HR) 0.4 mg/hr Place 1 patch (0.4 mg total) onto the skin daily.  90 patch  3  . NITROSTAT 0.4 MG SL tablet TAKE 1 TABLET BY MOUTH SUBLINGUALLY AS NEEDED  100 tablet  0  . potassium chloride (KLOR-CON) 10 MEQ CR tablet Take 1 tablet (10 mEq total) by mouth 2 (two) times daily.  180 tablet  3  . furosemide (LASIX) 40 MG tablet Take 1 tablet (40 mg total) by mouth daily.  30 tablet  5    Allergies  Allergen Reactions  . Morphine And Related     Patient Active Problem List  Diagnoses  . Ischemic heart disease  . Hypercholesterolemia  . Benign hypertensive heart disease without heart failure  . Iron deficiency anemia  . Hx of bladder cancer    History  Smoking status  . Former Smoker -- 1.0 packs/day for 26 years  . Types: Cigarettes  . Quit date: 08/17/1983  Smokeless tobacco  . Former Neurosurgeon  . Types: Chew    History  Alcohol Use No    Family History  Problem Relation Age of Onset  . Heart disease Father   . Heart attack Father   . Emphysema Brother   . Coronary artery disease Sister     X5 CABG still living  . Heart failure Sister     Review of Systems: Constitutional: no fever chills diaphoresis or fatigue or change in weight.  Head and neck: no hearing loss, no epistaxis, no photophobia or visual disturbance. Respiratory: No cough, shortness of breath or wheezing. Cardiovascular: No chest pain , palpitations. Gastrointestinal: No abdominal distention, no abdominal pain, no change in bowel habits hematochezia or melena. Genitourinary: No dysuria, no frequency, no urgency, no nocturia. Musculoskeletal:No arthralgias, no back pain, no gait disturbance or myalgias. Neurological: No dizziness,  no headaches, no numbness, no seizures, no syncope, no weakness, no tremors. Hematologic: No lymphadenopathy, no easy bruising. Psychiatric: No confusion, no hallucinations, no sleep disturbance.    Physical Exam: Filed Vitals:   07/20/11 0913  BP: 140/80  Pulse: 72   The general appearance reveals a well-developed, elderly gentleman in no distress.Pupils equal and reactive.   Extraocular Movements are full.  There is no scleral icterus.  The mouth and pharynx are normal.  The neck is supple.  The carotids reveal no bruits.  The jugular venous pressure is normal.  The thyroid is not enlarged.  There is no lymphadenopathy.  Chest reveals bilateral inspiratory ralesThe precordium is quiet.  The first heart sound is normal.  The second heart sound is physiologically split.  There is no murmur gallop rub or click.  There is no abnormal lift or heave.  The abdomen is soft with ascites.  Extremities show 3+ pitting edema.Strength is normal and symmetrical in all extremities.  There is no lateralizing weakness.  There are no sensory deficits.  The skin is warm and dry.  There is no rash.   Assessment / Plan:  Stop hydrochlorothiazide and start furosemide 40 mg daily.  Await results of today's chest x-ray and blood work.  Recheck a basal metabolic panel in one week and return in about one month to see Lawson Fiscal or me.

## 2011-07-23 ENCOUNTER — Telehealth: Payer: Self-pay | Admitting: *Deleted

## 2011-07-23 NOTE — Telephone Encounter (Signed)
Advised of labs 

## 2011-07-23 NOTE — Progress Notes (Signed)
Advised patient

## 2011-07-23 NOTE — Telephone Encounter (Signed)
Message copied by Burnell Blanks on Mon Jul 23, 2011 12:34 PM ------      Message from: Cassell Clement      Created: Mon Jul 23, 2011  8:40 AM       Please report.  The thyroid function is normal.  The CBC is stable.  The heart failure.  Blood test is elevated.  This should improve with Lasix.

## 2011-07-24 ENCOUNTER — Ambulatory Visit (INDEPENDENT_AMBULATORY_CARE_PROVIDER_SITE_OTHER): Payer: Medicare Other | Admitting: *Deleted

## 2011-07-24 DIAGNOSIS — I119 Hypertensive heart disease without heart failure: Secondary | ICD-10-CM

## 2011-07-24 DIAGNOSIS — Z79899 Other long term (current) drug therapy: Secondary | ICD-10-CM

## 2011-07-24 LAB — BASIC METABOLIC PANEL
BUN: 13 mg/dL (ref 6–23)
Creatinine, Ser: 1.2 mg/dL (ref 0.4–1.5)
GFR: 63.15 mL/min (ref >60.00)
Potassium: 4.4 mEq/L (ref 3.5–5.1)

## 2011-07-27 ENCOUNTER — Other Ambulatory Visit: Payer: Self-pay | Admitting: Cardiology

## 2011-07-27 DIAGNOSIS — I119 Hypertensive heart disease without heart failure: Secondary | ICD-10-CM

## 2011-07-27 NOTE — Progress Notes (Signed)
Advised wife  

## 2011-07-27 NOTE — Telephone Encounter (Signed)
Advised wife of lab results.  

## 2011-07-27 NOTE — Telephone Encounter (Signed)
Message copied by Burnell Blanks on Fri Jul 27, 2011  8:24 AM ------      Message from: Cassell Clement      Created: Wed Jul 25, 2011  9:39 PM       Please report. K is stable on Lasix.  Continue same Rx

## 2011-08-07 ENCOUNTER — Encounter: Payer: Self-pay | Admitting: Nurse Practitioner

## 2011-08-07 ENCOUNTER — Telehealth: Payer: Self-pay | Admitting: Cardiology

## 2011-08-07 ENCOUNTER — Ambulatory Visit (INDEPENDENT_AMBULATORY_CARE_PROVIDER_SITE_OTHER): Payer: Medicare Other | Admitting: Nurse Practitioner

## 2011-08-07 ENCOUNTER — Emergency Department (HOSPITAL_COMMUNITY): Payer: Medicare Other

## 2011-08-07 ENCOUNTER — Telehealth: Payer: Self-pay | Admitting: *Deleted

## 2011-08-07 ENCOUNTER — Inpatient Hospital Stay (HOSPITAL_COMMUNITY)
Admission: EM | Admit: 2011-08-07 | Discharge: 2011-08-17 | DRG: 291 | Disposition: A | Discharge status: Skilled Nursing Facility | Payer: Medicare Other | Attending: Cardiology | Admitting: Cardiology

## 2011-08-07 ENCOUNTER — Inpatient Hospital Stay: Admit: 2011-08-07 | Payer: Self-pay | Admitting: Cardiology

## 2011-08-07 DIAGNOSIS — Z8551 Personal history of malignant neoplasm of bladder: Secondary | ICD-10-CM

## 2011-08-07 DIAGNOSIS — I4510 Unspecified right bundle-branch block: Secondary | ICD-10-CM | POA: Diagnosis present

## 2011-08-07 DIAGNOSIS — I079 Rheumatic tricuspid valve disease, unspecified: Secondary | ICD-10-CM | POA: Diagnosis present

## 2011-08-07 DIAGNOSIS — I2789 Other specified pulmonary heart diseases: Secondary | ICD-10-CM | POA: Diagnosis present

## 2011-08-07 DIAGNOSIS — R5381 Other malaise: Secondary | ICD-10-CM | POA: Diagnosis present

## 2011-08-07 DIAGNOSIS — E872 Acidosis, unspecified: Secondary | ICD-10-CM | POA: Diagnosis present

## 2011-08-07 DIAGNOSIS — I5023 Acute on chronic systolic (congestive) heart failure: Principal | ICD-10-CM | POA: Diagnosis present

## 2011-08-07 DIAGNOSIS — I509 Heart failure, unspecified: Secondary | ICD-10-CM | POA: Diagnosis present

## 2011-08-07 DIAGNOSIS — J9600 Acute respiratory failure, unspecified whether with hypoxia or hypercapnia: Secondary | ICD-10-CM | POA: Diagnosis present

## 2011-08-07 DIAGNOSIS — I259 Chronic ischemic heart disease, unspecified: Secondary | ICD-10-CM | POA: Diagnosis present

## 2011-08-07 DIAGNOSIS — Z7902 Long term (current) use of antithrombotics/antiplatelets: Secondary | ICD-10-CM

## 2011-08-07 DIAGNOSIS — J441 Chronic obstructive pulmonary disease with (acute) exacerbation: Secondary | ICD-10-CM | POA: Diagnosis present

## 2011-08-07 DIAGNOSIS — E785 Hyperlipidemia, unspecified: Secondary | ICD-10-CM | POA: Diagnosis present

## 2011-08-07 DIAGNOSIS — I10 Essential (primary) hypertension: Secondary | ICD-10-CM | POA: Diagnosis present

## 2011-08-07 DIAGNOSIS — G8929 Other chronic pain: Secondary | ICD-10-CM | POA: Diagnosis present

## 2011-08-07 DIAGNOSIS — D509 Iron deficiency anemia, unspecified: Secondary | ICD-10-CM | POA: Diagnosis present

## 2011-08-07 DIAGNOSIS — Z87891 Personal history of nicotine dependence: Secondary | ICD-10-CM

## 2011-08-07 DIAGNOSIS — Z79899 Other long term (current) drug therapy: Secondary | ICD-10-CM

## 2011-08-07 DIAGNOSIS — I252 Old myocardial infarction: Secondary | ICD-10-CM

## 2011-08-07 DIAGNOSIS — Z7982 Long term (current) use of aspirin: Secondary | ICD-10-CM

## 2011-08-07 DIAGNOSIS — M549 Dorsalgia, unspecified: Secondary | ICD-10-CM | POA: Diagnosis present

## 2011-08-07 DIAGNOSIS — R4182 Altered mental status, unspecified: Secondary | ICD-10-CM | POA: Diagnosis present

## 2011-08-07 DIAGNOSIS — I5020 Unspecified systolic (congestive) heart failure: Secondary | ICD-10-CM

## 2011-08-07 DIAGNOSIS — I2510 Atherosclerotic heart disease of native coronary artery without angina pectoris: Secondary | ICD-10-CM | POA: Diagnosis present

## 2011-08-07 LAB — CBC
Platelets: 114 10*3/uL — ABNORMAL LOW (ref 150–400)
RBC: 4.69 MIL/uL (ref 4.22–5.81)
WBC: 6.1 10*3/uL (ref 4.0–10.5)

## 2011-08-07 LAB — TSH: TSH: 6.457 u[IU]/mL — ABNORMAL HIGH (ref 0.350–4.500)

## 2011-08-07 LAB — COMPREHENSIVE METABOLIC PANEL
ALT: 34 U/L (ref 0–53)
AST: 78 U/L — ABNORMAL HIGH (ref 0–37)
Calcium: 9.4 mg/dL (ref 8.4–10.5)
GFR calc Af Amer: 88 mL/min — ABNORMAL LOW (ref >90)
Sodium: 135 mEq/L (ref 135–145)
Total Protein: 7.6 g/dL (ref 6.0–8.3)

## 2011-08-07 LAB — DIFFERENTIAL
Basophils Relative: 0 % (ref 0–1)
Eosinophils Absolute: 0.1 10*3/uL (ref 0.0–0.7)
Lymphs Abs: 1.5 10*3/uL (ref 0.7–4.0)
Neutrophils Relative %: 64 % (ref 43–77)

## 2011-08-07 LAB — APTT: aPTT: 33 seconds (ref 24–37)

## 2011-08-07 LAB — PROTIME-INR
INR: 1.2 (ref 0.00–1.49)
Prothrombin Time: 15.5 seconds — ABNORMAL HIGH (ref 11.6–15.2)

## 2011-08-07 LAB — MRSA PCR SCREENING: MRSA by PCR: NEGATIVE

## 2011-08-07 LAB — CK TOTAL AND CKMB (NOT AT ARMC): Relative Index: INVALID (ref 0.0–2.5)

## 2011-08-07 LAB — TROPONIN I: Troponin I: <0.3 ng/mL (ref <0.30)

## 2011-08-07 NOTE — Telephone Encounter (Signed)
Opened in error

## 2011-08-07 NOTE — Patient Instructions (Signed)
We are going to admit to the hospital.  

## 2011-08-07 NOTE — Assessment & Plan Note (Addendum)
He looks poorly. He is subsequently seen with Dr. Patty Sermons. Will go ahead and admit to step down for diuresis. Labs will be checked. He will need an echo. Will start low dose ACE. Further disposition per Dr. Patty Sermons. Patient and wife are agreeable.

## 2011-08-07 NOTE — Telephone Encounter (Signed)
Pt wife calling with concerns of pt c/o reaction to new medicaiton MD put pt on, lasix. Pt has red splotches, edema in genitals, extreme nausea; symptoms have been present since 07/20/11 Please return call to discuss further.

## 2011-08-07 NOTE — Telephone Encounter (Signed)
Agree with plan 

## 2011-08-07 NOTE — Telephone Encounter (Signed)
Took Lasix for 1 week, swollen all over.  Very weak and shortness of breath.  Swelling went down some after discontinuing.  Upper part of body, chest, and stomach feel tight and swollen.  Very nauseated.  States he doesn't feel like doing anything. Will schedule appointment with Lawson Fiscal or Dr Patty Sermons

## 2011-08-07 NOTE — Progress Notes (Signed)
Trevor [name] Date of Birth: 1925-03-24 Medical Record #[id number]  History of Present Illness: Trevor [name] is seen today for a work in visit. He is seen with Dr. Patty Sermons. He has a history of CAD with large inferior MI back in 1984. He had associated VFIB. We do not think he was ever cathed.He had a very slow recovery time. He has done fairly well up until the past weeks. He has had more swelling. Recently started on Lasix.  He has gotten progressively worse. He actually stopped taking his Lasix because he got more bloated. He is having more scrotal edema.  He can hardly button his pants, he is so edematous. Has had some chest tightness. He is short of breath with even talking.Overall, he just feels bad. He is admitted for further evaluation.   Current Outpatient Prescriptions on File Prior to Visit  Medication Sig Dispense Refill  . amitriptyline-chlordiazePOXIDE (LIMBITROL DS) 10-25 MG TABS Take 1 tablet by mouth 2 (two) times daily.  180 tablet  3  . aspirin 325 MG EC tablet Take 325 mg by mouth daily.        [name] atenolol (TENORMIN) 25 MG tablet TAKE 1 TABLET BY MOUTH EVERY DAY  30 tablet  11  . calcium-vitamin D (OSCAL) 250-125 MG-UNIT per tablet Take 1 tablet by mouth daily.        . clopidogrel (PLAVIX) 75 MG tablet Take 75 mg by mouth daily.        . ferrous sulfate 325 (65 FE) MG tablet TAKE ONE TABLET BY MOUTH EVERY DAY  90 tablet  3  . HYDROcodone-acetaminophen (VICODIN) 5-500 MG per tablet Take 1 tablet by mouth every 8 (eight) hours as needed.        . multivitamin (THERAGRAN) per tablet Take 1 tablet by mouth daily.        . nitroGLYCERIN (NITRODUR - DOSED IN MG/24 HR) 0.4 mg/hr Place 1 patch (0.4 mg total) onto the skin daily.  90 patch  3  . NITROSTAT 0.4 MG SL tablet TAKE 1 TABLET BY MOUTH SUBLINGUALLY AS NEEDED  100 tablet  0  . potassium chloride (KLOR-CON) 10 MEQ CR tablet Take 1 tablet (10 mEq total) by mouth 2 (two) times daily.  180 tablet  3  . atorvastatin (LIPITOR) 20 MG  tablet Take 20 mg by mouth daily.        . furosemide (LASIX) 40 MG tablet Take 1 tablet (40 mg total) by mouth daily.  30 tablet  5    Allergies  Allergen Reactions  . Morphine And Related     Past Medical History  Diagnosis Date  . CAD (coronary artery disease)     Remote inferior MI in 1984, complicated by VFIB requiring multiple shocks, cardiogenic shock and CHF  . CHF (congestive heart failure)     Last echo in 2005 with EF 50 to 55%  . Arrhythmia     Hx of VFIB in 1984  . Abnormal nuclear cardiac imaging test     Lexiscan in Jan 2011 with EF 44% and large area of infarct involving the inferoseptal basal, inferior and inferolateral wall.   . Iron deficiency anemia   . HTN (hypertension)   . Bladder cancer   . Hip fracture     Past Surgical History  Procedure Date  . Hernia repair   . Hip surgery   . Ankle surgery     History  Smoking status  . Former Smoker -- 1.0 packs/day  for 26 years  . Types: Cigarettes  . Quit date: 08/17/1983  Smokeless tobacco  . Former Neurosurgeon  . Types: Chew    History  Alcohol Use No    Family History  Problem Relation Age of Onset  . Heart disease Father   . Heart attack Father   . Emphysema Brother   . Coronary artery disease Sister     X5 CABG still living  . Heart failure Sister     Review of Systems: The review of systems is positive for significant abdominal bloating, nausea and shortness of breath.  Still with lower extremity edema. Does have an abnormal CXR from about 2 weeks ago. No CT performed yet. All other systems were reviewed and are negative.  Physical Exam: BP 136/88  Pulse 54  Ht 5' (1.524 m)  Wt 189 lb 12.8 oz (86.093 kg)  BMI 37.07 kg/m2  SpO2 99% Patient is very pleasant but in mild distress. He is short of breath with even talking. Skin is warm and dry. Color is normal.  HEENT is unremarkable. Normocephalic/atraumatic. PERRL. Sclera are nonicteric. Neck is supple. No masses. No JVD. Lungs show very  decreased breath sounds. Oxygen sat is 99%. Cardiac exam shows a regular rate and rhythm. S2 is split. No murmur but heart tones are very distant. Abdomen is very full and hard with edema.  Extremities are with 1 to 2+ edema bilaterally. Gait and ROM are intact. No gross neurologic deficits noted.   LABORATORY DATA: EKG shows sinus brady, wide QRS, RBBB, old inferior MI. No tracing to compare to. Tracing is reviewed with Dr. Patty Sermons.    Assessment / Plan:

## 2011-08-08 DIAGNOSIS — I369 Nonrheumatic tricuspid valve disorder, unspecified: Secondary | ICD-10-CM

## 2011-08-08 DIAGNOSIS — I5023 Acute on chronic systolic (congestive) heart failure: Secondary | ICD-10-CM

## 2011-08-08 LAB — PRO B NATRIURETIC PEPTIDE: Pro B Natriuretic peptide (BNP): 4120 pg/mL — ABNORMAL HIGH (ref 0–450)

## 2011-08-08 LAB — CARDIAC PANEL(CRET KIN+CKTOT+MB+TROPI): Relative Index: INVALID (ref 0.0–2.5)

## 2011-08-08 LAB — BASIC METABOLIC PANEL
BUN: 11 mg/dL (ref 6–23)
CO2: 37 mEq/L — ABNORMAL HIGH (ref 19–32)
CO2: 35 mEq/L — ABNORMAL HIGH (ref 19–32)
Calcium: 8.9 mg/dL (ref 8.4–10.5)
Chloride: 91 mEq/L — ABNORMAL LOW (ref 96–112)
Creatinine, Ser: 1.05 mg/dL (ref 0.50–1.35)
Creatinine, Ser: 1.09 mg/dL (ref 0.50–1.35)
Potassium: 4.1 mEq/L (ref 3.5–5.1)

## 2011-08-09 ENCOUNTER — Inpatient Hospital Stay (HOSPITAL_COMMUNITY): Payer: Medicare Other

## 2011-08-09 DIAGNOSIS — J9600 Acute respiratory failure, unspecified whether with hypoxia or hypercapnia: Secondary | ICD-10-CM

## 2011-08-09 DIAGNOSIS — E872 Acidosis: Secondary | ICD-10-CM

## 2011-08-09 DIAGNOSIS — R4182 Altered mental status, unspecified: Secondary | ICD-10-CM

## 2011-08-09 DIAGNOSIS — R0902 Hypoxemia: Secondary | ICD-10-CM

## 2011-08-09 LAB — POCT I-STAT 3, ART BLOOD GAS (G3+)
Acid-Base Excess: 15 mmol/L — ABNORMAL HIGH (ref 0.0–2.0)
Acid-Base Excess: 17 mmol/L — ABNORMAL HIGH (ref 0.0–2.0)
Acid-Base Excess: 17 mmol/L — ABNORMAL HIGH (ref 0.0–2.0)
Bicarbonate: 47.8 mEq/L — ABNORMAL HIGH (ref 20.0–24.0)
Bicarbonate: 46.7 mEq/L — ABNORMAL HIGH (ref 20.0–24.0)
O2 Saturation: 87 %
O2 Saturation: 97 %
O2 Saturation: 96 %
Patient temperature: 98.7
TCO2: >50 mmol/L (ref 0–100)
TCO2: >50 mmol/L (ref 0–100)
pCO2 arterial: 111.3 mmHg (ref 35.0–45.0)
pCO2 arterial: 81.7 mmHg (ref 35.0–45.0)
pCO2 arterial: 90.1 mmHg (ref 35.0–45.0)
pH, Arterial: 7.312 — ABNORMAL LOW (ref 7.350–7.450)
pO2, Arterial: 68 mmHg — ABNORMAL LOW (ref 80.0–100.0)
pO2, Arterial: 101 mmHg — ABNORMAL HIGH (ref 80.0–100.0)

## 2011-08-09 LAB — BASIC METABOLIC PANEL
BUN: 13 mg/dL (ref 6–23)
BUN: 16 mg/dL (ref 6–23)
CO2: 38 mEq/L — ABNORMAL HIGH (ref 19–32)
Calcium: 8.7 mg/dL (ref 8.4–10.5)
Chloride: 91 mEq/L — ABNORMAL LOW (ref 96–112)
GFR calc non Af Amer: 48 mL/min — ABNORMAL LOW (ref >90)
Glucose, Bld: 131 mg/dL — ABNORMAL HIGH (ref 70–99)
Glucose, Bld: 147 mg/dL — ABNORMAL HIGH (ref 70–99)
Potassium: 4.2 mEq/L (ref 3.5–5.1)
Sodium: 137 mEq/L (ref 135–145)
Sodium: 137 mEq/L (ref 135–145)

## 2011-08-09 LAB — CBC
HCT: 36.7 % — ABNORMAL LOW (ref 39.0–52.0)
Hemoglobin: 11 g/dL — ABNORMAL LOW (ref 13.0–17.0)
MCV: 80 fL (ref 78.0–100.0)
RBC: 4.59 MIL/uL (ref 4.22–5.81)
RDW: 16.4 % — ABNORMAL HIGH (ref 11.5–15.5)
WBC: 5.9 10*3/uL (ref 4.0–10.5)

## 2011-08-10 ENCOUNTER — Inpatient Hospital Stay (HOSPITAL_COMMUNITY): Payer: Medicare Other

## 2011-08-10 LAB — BLOOD GAS, ARTERIAL
Bicarbonate: 44.3 mEq/L — ABNORMAL HIGH (ref 20.0–24.0)
TCO2: 46.9 mmol/L (ref 0–100)
pCO2 arterial: 86 mmHg (ref 35.0–45.0)
pH, Arterial: 7.332 — ABNORMAL LOW (ref 7.350–7.450)
pO2, Arterial: 110 mmHg — ABNORMAL HIGH (ref 80.0–100.0)

## 2011-08-10 LAB — BASIC METABOLIC PANEL
BUN: 19 mg/dL (ref 6–23)
CO2: 45 mEq/L (ref 19–32)
Calcium: 8.9 mg/dL (ref 8.4–10.5)
Chloride: 91 mEq/L — ABNORMAL LOW (ref 96–112)
GFR calc Af Amer: 63 mL/min — ABNORMAL LOW (ref >90)
GFR calc Af Amer: 69 mL/min — ABNORMAL LOW (ref >90)
Glucose, Bld: 96 mg/dL (ref 70–99)
Potassium: 4.1 mEq/L (ref 3.5–5.1)
Sodium: 137 mEq/L (ref 135–145)

## 2011-08-10 LAB — CBC
HCT: 34.1 % — ABNORMAL LOW (ref 39.0–52.0)
Hemoglobin: 10.4 g/dL — ABNORMAL LOW (ref 13.0–17.0)
RDW: 15.8 % — ABNORMAL HIGH (ref 11.5–15.5)
WBC: 6.3 10*3/uL (ref 4.0–10.5)

## 2011-08-10 LAB — PRO B NATRIURETIC PEPTIDE: Pro B Natriuretic peptide (BNP): 3751 pg/mL — ABNORMAL HIGH (ref 0–450)

## 2011-08-11 LAB — CBC
MCV: 76.3 fL — ABNORMAL LOW (ref 78.0–100.0)
Platelets: 106 10*3/uL — ABNORMAL LOW (ref 150–400)
RBC: 4.34 MIL/uL (ref 4.22–5.81)
WBC: 7 10*3/uL (ref 4.0–10.5)

## 2011-08-11 LAB — PHOSPHORUS: Phosphorus: 2.7 mg/dL (ref 2.3–4.6)

## 2011-08-11 LAB — BASIC METABOLIC PANEL
CO2: 44 mEq/L (ref 19–32)
Chloride: 90 mEq/L — ABNORMAL LOW (ref 96–112)
Creatinine, Ser: 1.13 mg/dL (ref 0.50–1.35)
Potassium: 4 mEq/L (ref 3.5–5.1)
Sodium: 138 mEq/L (ref 135–145)

## 2011-08-11 LAB — MAGNESIUM: Magnesium: 2.1 mg/dL (ref 1.5–2.5)

## 2011-08-12 DIAGNOSIS — J9600 Acute respiratory failure, unspecified whether with hypoxia or hypercapnia: Secondary | ICD-10-CM

## 2011-08-12 DIAGNOSIS — R0902 Hypoxemia: Secondary | ICD-10-CM

## 2011-08-12 DIAGNOSIS — R4182 Altered mental status, unspecified: Secondary | ICD-10-CM

## 2011-08-12 DIAGNOSIS — E872 Acidosis: Secondary | ICD-10-CM

## 2011-08-12 LAB — BASIC METABOLIC PANEL
BUN: 21 mg/dL (ref 6–23)
Chloride: 92 mEq/L — ABNORMAL LOW (ref 96–112)
GFR calc Af Amer: 86 mL/min — ABNORMAL LOW (ref >90)
Glucose, Bld: 128 mg/dL — ABNORMAL HIGH (ref 70–99)
Potassium: 4 mEq/L (ref 3.5–5.1)
Sodium: 135 mEq/L (ref 135–145)

## 2011-08-12 LAB — PHOSPHORUS: Phosphorus: 3.5 mg/dL (ref 2.3–4.6)

## 2011-08-12 LAB — BLOOD GAS, ARTERIAL
O2 Content: 2 L/min
Patient temperature: 98.6
TCO2: 39 mmol/L (ref 0–100)
pCO2 arterial: 59.8 mmHg (ref 35.0–45.0)
pO2, Arterial: 61.1 mmHg — ABNORMAL LOW (ref 80.0–100.0)

## 2011-08-12 LAB — GLUCOSE, CAPILLARY: Glucose-Capillary: 121 mg/dL — ABNORMAL HIGH (ref 70–99)

## 2011-08-12 LAB — CBC
HCT: 34.1 % — ABNORMAL LOW (ref 39.0–52.0)
Hemoglobin: 10.9 g/dL — ABNORMAL LOW (ref 13.0–17.0)
WBC: 6.4 10*3/uL (ref 4.0–10.5)

## 2011-08-13 DIAGNOSIS — I5021 Acute systolic (congestive) heart failure: Secondary | ICD-10-CM

## 2011-08-13 LAB — BASIC METABOLIC PANEL
BUN: 22 mg/dL (ref 6–23)
Chloride: 98 mEq/L (ref 96–112)
Creatinine, Ser: 0.87 mg/dL (ref 0.50–1.35)
Glucose, Bld: 109 mg/dL — ABNORMAL HIGH (ref 70–99)
Potassium: 4 mEq/L (ref 3.5–5.1)

## 2011-08-13 LAB — CBC
HCT: 35.7 % — ABNORMAL LOW (ref 39.0–52.0)
Hemoglobin: 11.5 g/dL — ABNORMAL LOW (ref 13.0–17.0)
MCV: 76.6 fL — ABNORMAL LOW (ref 78.0–100.0)
WBC: 6.6 10*3/uL (ref 4.0–10.5)

## 2011-08-14 LAB — BASIC METABOLIC PANEL
BUN: 26 mg/dL — ABNORMAL HIGH (ref 6–23)
CO2: 38 mEq/L — ABNORMAL HIGH (ref 19–32)
Chloride: 93 mEq/L — ABNORMAL LOW (ref 96–112)
GFR calc non Af Amer: 67 mL/min — ABNORMAL LOW (ref >90)
Glucose, Bld: 143 mg/dL — ABNORMAL HIGH (ref 70–99)
Potassium: 4.3 mEq/L (ref 3.5–5.1)
Sodium: 135 mEq/L (ref 135–145)

## 2011-08-15 LAB — BASIC METABOLIC PANEL
BUN: 27 mg/dL — ABNORMAL HIGH (ref 6–23)
CO2: 33 mEq/L — ABNORMAL HIGH (ref 19–32)
Chloride: 97 mEq/L (ref 96–112)
GFR calc Af Amer: 86 mL/min — ABNORMAL LOW (ref >90)
Glucose, Bld: 119 mg/dL — ABNORMAL HIGH (ref 70–99)
Potassium: 4.3 mEq/L (ref 3.5–5.1)

## 2011-08-15 NOTE — Discharge Summary (Addendum)
NAMEJERRIAN, MELLS NO.:  [id number]  MEDICAL RECORD NO.:  [id number]  LOCATION:  2027                         FACILITY:  [facility]  PHYSICIAN:  [name], MD      DATE OF BIRTH:  05/05/1925  DATE OF ADMISSION:  08/07/2011 DATE OF DISCHARGE:  08/15/2011                              DISCHARGE SUMMARY   PRIMARY DISCHARGE DIAGNOSES: 1. Acute systolic heart failure with history of diastolic heart     failure in the past.     a.     Newly decreased EF of 25% by echo on August 08, 2011, last      echocardiogram in 2005 showing ejection fraction of 50%-55%.     b.     At outpatient followup, will likely have a Myoview      scheduled. 2. Coronary artery disease.     a.     History of inferior myocardial infarction in 1984 with      associated VFib requiring multiple shocks/cardiogenic shock/heart      failure, not believed to have had a catheterization at that time. 3. Altered mental status felt secondary to hypercarbia in the setting     of sedation, improved with BiPAP, Romazicon, and acetazolamide.     a.     For outpatient follow up with Pulmonology. 4. Abnormal TSH, instructed to follow up with PCP for assessment.  SECONDARY DISCHARGE DIAGNOSES: 1. Iron-deficiency anemia. 2. Hypertension. 3. Bladder cancer. 4. Chronic back pain. 5. History of hip fracture. 6. Chronic right bundle-branch block. 7. Former tobacco user.  HOSPITAL COURSE:  Mr. Jepsen is an 77 year old gentleman with a history of CAD and CHF as noted above, who presented to the office complaining of feeling poorly.  He had recently been started on Lasix because of increased fluid gain, but the patient self discontinued this because he got more bloated.  When he presented to the office, he complained that he could not even button his pants now and had significant scrotal edema.  EKG showed sinus bradycardia with right bundle-branch block and old inferior MI without acute changes.  He  was suspected to have a component of heart failure and was initiated on Lasix and ACE inhibitor and admitted to the hospital for diuresis. Strict Is and Os and daily weights were followed.  Atenolol was discontinued.  He was started on Coreg.  His creatinine was followed with diuresis.  2D echocardiogram demonstrated an EF of 25% which was lower than previously known.  The plan was for [facility] at some point when the patient had recovered from this hospitalization.  Blood cultures were negative.  Cardiac enzymes were negative x2.  During this hospitalization, the patient did have an issue with sedation and lethargy, felt related to hypercarbia, which was ultimately felt to possibly be a chronic issue for the patient.  His Librium was held. Flumazenil was given and the patient received intermittent BiPAP for this problem.  He was also placed on Avelox for 4 days and received DuoNebs as well.  Lasix was held on 18th secondary to hypotension and resumed the following day as he was still volume overloaded.  From that point of hospitalization  on, he received several days of acetazolamide stimulated breathing in the setting of acidosis.  This seem to improve the patient's status.  He was changed over to p.o. Lasix and observed. He was seen by PT and felt to be significantly deconditioned and felt the benefit from rehab.  We are in the process of setting that up right now.  As of today, the patient is stable.  Dr. Shirlee Latch has seen and examined today and feels he is stable for discharge.  DISCHARGE LABS:  WBC 6.6, hemoglobin 11.5, hematocrit 35.7, platelet count 126, sodium 136, potassium 4.3, chloride 97, CO2 of 33, glucose 119, BUN 27, and creatinine 0.96.  Blood cultures negative x2.  TSH 6.457.  The patient will be instructed to have this repeated as an outpatient and followed.  Admit BNP 4502.  STUDIES: 1. Chest x-ray August 09, 2011, showed cardiac enlargement, central      vascular congestion without pulmonary edema.  Small pleural     effusions and bibasilar atelectasis. 2. Abdominal plain film on August 10, 2011, showed suboptimal     position due to the patient lying in a recliner.  Mild prominence     of bowel gas pattern, nonspecific. 3. 2D echocardiogram on August 08, 2011, showed EF of 25% with global     hypokinesis with posterior and inferior akinesis.  Grade 2     diastolic dysfunction.  Trivial mitral regurgitation.  Systolic     function of the RV was moderately reduced.  Moderate tricuspid     regurgitation.  Mild pulmonary hypertension.  DISCHARGE MEDICATIONS: 1. Albuterol 2.5 mg/3 mL nebulized solution 2.5 mg inhaled q.6 hours. 2. Amitriptyline 25 mg daily. 3. Coreg 6.25 mg b.i.d. with meals. 4. Lasix 40 mg b.i.d. 5. Lisinopril 5 mg daily. 6. Spironolactone 25 mg half tablet daily. 7. Spiriva 18 mcg inhaled daily. 8. Aspirin 81 mg daily. 9. Potassium chloride 20 mEq b.i.d. 10.Calcium carbonate 600 mg b.i.d. 11.Ferrous sulfate 325 mg every morning. 12.Lipitor 20 mg every morning. 13.Multivitamin 1 tablet daily. 14.Nitroglycerin patch 0.4 mg/hour daily. 15.Nitroglycerin sublingual 0.4 mg q.5 minutes, up to 3 doses p.r.n.     chest pain. 16.Plavix 75 mg every morning. 17.Vicodin 5/500 mg b.i.d. with instructions to hold for sedation. His atenolol was changed to Coreg this admission.  HCTZ was discontinued.  His Librium/amitriptyline combination tablet was changed only to amitriptyline given his sedation this admission.  DISPOSITION:  Mr. Tickner will be discharged in stable condition to SNF facility when bed becomes available.  He is to increase activity slowly and follow a low-salt heart-healthy diet.  He will follow up with Dr. Patty Sermons on August 21, 2011, at 9:00 a.m., to see Norma Fredrickson, NP, and we will also have a BMET that day.  He is also instructed to follow up with [facility] for an appointment and to  schedule pulmonary function tests per their recommendation.  He is also to follow up with his PCP regarding possible further management/evaluation of his thyroid function given his mildly elevated TSH this admission.  DURATION OF DISCHARGE ENCOUNTER:  Greater than 30 minutes including physician PA time.     [name], P.A.C.   ______________________________ [name], MD    DD/MEDQ  D:  08/15/2011  T:  08/15/2011  Job:  [id number]  cc:   [facility] Cassell Clement, M.D.  Electronically Signed by [name] MD on 08/15/2011 08:32:52 PM Electronically Signed by [name]  on 08/21/2011 02:09:41 PM

## 2011-08-16 ENCOUNTER — Encounter: Payer: Self-pay | Admitting: *Deleted

## 2011-08-16 ENCOUNTER — Telehealth: Payer: Self-pay | Admitting: Cardiology

## 2011-08-16 LAB — BASIC METABOLIC PANEL
Chloride: 98 mEq/L (ref 96–112)
GFR calc non Af Amer: 77 mL/min — ABNORMAL LOW (ref >90)
Glucose, Bld: 101 mg/dL — ABNORMAL HIGH (ref 70–99)
Potassium: 4.5 mEq/L (ref 3.5–5.1)
Sodium: 136 mEq/L (ref 135–145)

## 2011-08-16 LAB — CULTURE, BLOOD (ROUTINE X 2)
Culture  Setup Time: 201210190200
Culture: NO GROWTH

## 2011-08-16 NOTE — Telephone Encounter (Signed)
Spoke with wife and she had spoken with her husband and was confused about discharge for patient.  Reviewed discharge summary and advised it looked as though he will be going to SNF.  Advised for her to call nursing station at hospital and request call back from case worker to discuss placement further.  Will call back if unable to get more information

## 2011-08-16 NOTE — Telephone Encounter (Signed)
New message, pts wife called and stated pt in the hospital and being discharged today to skilled care.  She states she knows nothing about this and would like to be called.  Work number [phone]

## 2011-08-17 LAB — BASIC METABOLIC PANEL
BUN: 30 mg/dL — ABNORMAL HIGH (ref 6–23)
CO2: 33 mEq/L — ABNORMAL HIGH (ref 19–32)
Chloride: 95 mEq/L — ABNORMAL LOW (ref 96–112)
GFR calc Af Amer: 87 mL/min — ABNORMAL LOW (ref >90)
Potassium: 4.2 mEq/L (ref 3.5–5.1)

## 2011-08-20 ENCOUNTER — Encounter: Payer: Self-pay | Admitting: Cardiology

## 2011-08-20 ENCOUNTER — Telehealth: Payer: Self-pay | Admitting: Cardiology

## 2011-08-20 NOTE — Telephone Encounter (Signed)
Pt's wife called and said she wanted to know if the nursing home should bring him tomorrow or move the appt out further. Please call

## 2011-08-20 NOTE — Telephone Encounter (Signed)
Will just keep office visit for tomorrow

## 2011-08-21 ENCOUNTER — Other Ambulatory Visit: Payer: Medicare Other | Admitting: *Deleted

## 2011-08-21 ENCOUNTER — Ambulatory Visit: Payer: Medicare Other | Admitting: Nurse Practitioner

## 2011-08-21 ENCOUNTER — Ambulatory Visit (INDEPENDENT_AMBULATORY_CARE_PROVIDER_SITE_OTHER): Payer: Medicare Other | Admitting: Cardiology

## 2011-08-21 ENCOUNTER — Encounter: Payer: Self-pay | Admitting: Cardiology

## 2011-08-21 VITALS — BP 120/68 | HR 60 | Ht 64.0 in | Wt 168.4 lb

## 2011-08-21 DIAGNOSIS — I259 Chronic ischemic heart disease, unspecified: Secondary | ICD-10-CM

## 2011-08-21 DIAGNOSIS — I5023 Acute on chronic systolic (congestive) heart failure: Secondary | ICD-10-CM

## 2011-08-21 DIAGNOSIS — D509 Iron deficiency anemia, unspecified: Secondary | ICD-10-CM

## 2011-08-21 LAB — BASIC METABOLIC PANEL
CO2: 26 mEq/L (ref 19–32)
Chloride: 97 mEq/L (ref 96–112)
Creatinine, Ser: 1.1 mg/dL (ref 0.4–1.5)
Sodium: 133 mEq/L — ABNORMAL LOW (ref 135–145)

## 2011-08-21 NOTE — Assessment & Plan Note (Signed)
The patient has known coronary artery disease.  He has a history of having had a large inferior wall myocardial infarction in 1984, with associated recurrent ventricular fibrillation and congestive heart failure.  He was in the hospital for a period of several months due partially according to his wife to the fact that his recurrent defibrillations for ventricular fibrillation caused compression fractures of his thoracic spine.  He has never had cardiac catheterization.  He had been doing well until his recent exacerbation of CHF.  The plan is eventually to do an outpatient nuclear scan study

## 2011-08-21 NOTE — Discharge Summary (Addendum)
  NAMEMarland Kitchen  [name], Trevor NO.:  [id number]  MEDICAL RECORD NO.:  [id number]  LOCATION:  2027                         FACILITY:  [facility]  PHYSICIAN:  [name], MD      DATE OF BIRTH:  Dec 25, 1924  DATE OF ADMISSION:  08/07/2011 DATE OF DISCHARGE:  08/17/2011                              DISCHARGE SUMMARY   Please note, the patient was not discharged on August 15, 2011 secondary to bed availability.  Social work kindly assisted [place] in the process and the patient will be discharged to a skilled nursing facility today.  Based on his clinical presentation and weights as well as lab work, some of his medications have been adjusted.  His med list is listed as below.  Albuterol 2.5 mg/3 mL nebulized solution, 2.5 mg inhaled q.6 hours, amitriptyline 25 mg daily, Coreg 6.25 mg b.i.d., Lasix 40 mg p.o. one tablet in the morning, half tablet in the evening, lisinopril 5 mg daily, spironolactone 25 mg daily, Spiriva 18 mcg one capsule inhaled daily, aspirin 81 mg daily, potassium chloride 10 mEq daily, calcium carbonate 600 mg b.i.d., ferrous sulfate 325 mg every morning, Lipitor 20 mg every morning, multivitamin one tablet daily, nitroglycerin patch 0.4 mg/hour one patch transdermally daily, nitroglycerin sublingual 0.4 mg every 5 minutes as needed up to 3 doses for chest pain, Plavix 75 mg every morning, Vicodin 5/500 one tablet b.i.d. with instructions for sedation.  Now that the bed has become available, the patient will be discharged in stable condition to SNF facility.     [name], P.A.C.   ______________________________ [name], MD    DD/MEDQ  D:  08/17/2011  T:  08/17/2011  Job:  [id number]  cc:   [name], MD  Electronically Signed by [name]  on 08/21/2011 02:09:44 PM Electronically Signed by [name] MD on 08/25/2011 11:31:02 PM

## 2011-08-21 NOTE — Patient Instructions (Addendum)
Follow up with Lawson Fiscal NP on 09/04/11 at 11:00  Use Albuterol nebulizer as needed only and decrease Vicodin to every 12 hours AS NEEDED.  Call if any problems between now and follow up  Lab today and will call you with results

## 2011-08-21 NOTE — Assessment & Plan Note (Signed)
The patient was recently hospitalized at [facility] with acute on chronic systolic congestive heart failure.  His echocardiogram on 10/08/2011, showed an ejection fraction of 25% with global hypokinesis and with posterior and inferior akinesis, as well as grade 2 diastolic dysfunction and moderate tricuspid regurgitation and mild pulmonary hypertension.  In the hospital.  He was placed on Coreg 6.25 mg twice a day and was placed on spironolactone.  He was diuresed with IV Lasix and was sent home to the nursing home on oral Lasix.  The patient reports no significant dyspnea now.  His abdominal distention has improved significantly.  Weight is down 21 pounds since day of hospitalization.  Denies any dizziness.  He started physical therapy with the physical therapist yesterday.

## 2011-08-21 NOTE — Progress Notes (Signed)
Trevor [name] Date of Birth:  01-25-1925 [facility] [address]   [address] [place], [place]  [address] [phone]           Fax   [phone]  History of Present Illness: This pleasant 77 year old  gentleman is seen back for a followup post hospital office visit.  The patient was recently hospitalized with acute on chronic systolic congestive heart failure.  In the hospital.  He diuresed 21 pounds.  He was discharged from the hospital last week and is now a temporary resident at [facility] home skilled nursing facility, where he is receiving physical therapy because of deconditioning.  Hopefully, he will be allowed to return home in another week or 2.  Current Outpatient Prescriptions  Medication Sig Dispense Refill  . acetaminophen (TYLENOL) 325 MG tablet Take 650 mg by mouth every 6 (six) hours as needed.        . Albuterol (VENTOLIN IN) Inhale into the lungs.        [name] amitriptyline (ELAVIL) 25 MG tablet Take 25 mg by mouth daily.        [name] aspirin 325 MG EC tablet Take 81 mg by mouth daily.       [name] atorvastatin (LIPITOR) 20 MG tablet Take 20 mg by mouth daily.        . calcium carbonate (OS-CAL) 600 MG TABS Take 600 mg by mouth 2 (two) times daily with a meal.        . carvedilol (COREG) 6.25 MG tablet Take 6.25 mg by mouth 2 (two) times daily with a meal.        . clopidogrel (PLAVIX) 75 MG tablet Take 75 mg by mouth daily.        . ferrous sulfate 325 (65 FE) MG tablet TAKE ONE TABLET BY MOUTH EVERY DAY  90 tablet  3  . furosemide (LASIX) 40 MG tablet Take 40 mg by mouth 2 (two) times daily.        [name] HYDROcodone-acetaminophen (VICODIN) 5-500 MG per tablet Take 1 tablet by mouth every 12 (twelve) hours as needed.       [name] lisinopril (PRINIVIL,ZESTRIL) 5 MG tablet Take 5 mg by mouth daily.        . multivitamin (THERAGRAN) per tablet Take 1 tablet by mouth daily.        . nitroGLYCERIN (NITRODUR - DOSED IN MG/24 HR) 0.4 mg/hr Place 1 patch (0.4 mg total)  onto the skin daily.  90 patch  3  . NITROSTAT 0.4 MG SL tablet TAKE 1 TABLET BY MOUTH SUBLINGUALLY AS NEEDED  100 tablet  0  . potassium chloride SA (K-DUR,KLOR-CON) 20 MEQ tablet Take 20 mEq by mouth 2 (two) times daily.        [name] spironolactone (ALDACTONE) 25 MG tablet Take 25 mg by mouth as directed. 1/2 tablet daily      . tiotropium (SPIRIVA) 18 MCG inhalation capsule Place 18 mcg into inhaler and inhale daily.        [name] DISCONTD: furosemide (LASIX) 40 MG tablet Take 1 tablet (40 mg total) by mouth daily.  30 tablet  5    Allergies  Allergen Reactions  . Morphine And Related   . Vioxx (Rofecoxib)     Patient Active Problem List  Diagnoses  . Ischemic heart disease  . Hypercholesterolemia  . Benign hypertensive heart disease without heart failure  . Iron deficiency anemia  . Hx of bladder cancer  . Acute CHF (  congestive heart failure)  . Congestive heart failure with left ventricular systolic dysfunction  . Systolic CHF, acute on chronic    History  Smoking status  . Former Smoker -- 1.0 packs/day for 26 years  . Types: Cigarettes  . Quit date: 08/17/1983  Smokeless tobacco  . Former Neurosurgeon  . Types: Chew    History  Alcohol Use No    Family History  Problem Relation Age of Onset  . Heart disease Father   . Heart attack Father   . Hypertension Father   . Emphysema Brother   . Coronary artery disease Sister     X5 CABG still living  . Heart failure Sister   . Hypertension Mother     Review of Systems: Constitutional: no fever chills diaphoresis or fatigue or change in weight.  Head and neck: no hearing loss, no epistaxis, no photophobia or visual disturbance. Respiratory: No cough, shortness of breath or wheezing. Cardiovascular: No chest pain peripheral edema, palpitations. Gastrointestinal: No abdominal distention, no abdominal pain, no change in bowel habits hematochezia or melena. Genitourinary: No dysuria, no frequency, no urgency, no  nocturia. Musculoskeletal:No arthralgias, no back pain, no gait disturbance or myalgias. Neurological: No dizziness, no headaches, no numbness, no seizures, no syncope, no weakness, no tremors. Hematologic: No lymphadenopathy, no easy bruising. Psychiatric: No confusion, no hallucinations, no sleep disturbance.    Physical Exam: Filed Vitals:   08/21/11 0943  BP: 120/68  Pulse: 60   general appearance reveals a well-developed, well-nourished, short gentleman in no acute distress.  He comes in today by wheelchair and his wife is with him today.  He brought with him records from the nursing home.Pupils equal and reactive.   Extraocular Movements are full.  There is no scleral icterus.  The mouth and pharynx are normal.  The neck is supple.  The carotids reveal no bruits.  The jugular venous pressure is normal.  The thyroid is not enlarged.  There is no lymphadenopathy.  The chest is clear to percussion and auscultation. There are a few rales at both bases.  There is no wheezing or respiratory distressThe precordium is quiet.  The first heart sound is normal.  The second heart sound is physiologically split.  There is no murmur gallop rub or click.  There is no abnormal lift or heave.  The abdomen is soft and nontender. Bowel sounds are normal. The liver and spleen are not enlarged. There Are no abdominal masses. There are no bruits.  Extremities revealed trace pretibial and ankle edema   Assessment / Plan:  The patient is to continue his same medication.  His Vicodin should be known that as an once every 12 hours when necessary.  He is not having any wheezing, and we changed his albuterol nebulizer treatments 2 every 6 hours when necessary.  We are checking a basal metabolic panel today because he is on spironolactone.  We will have him return in 2 weeks for followup office visit with Lawson Fiscal and also an EKG and basal metabolic panel on that day.

## 2011-08-21 NOTE — Assessment & Plan Note (Signed)
The patient has a history of iron deficiency anemia.  He is on oral iron.  He is not having any hematochezia

## 2011-08-28 ENCOUNTER — Telehealth: Payer: Self-pay | Admitting: *Deleted

## 2011-08-28 NOTE — Telephone Encounter (Signed)
Message copied by Burnell Blanks on Tue Aug 28, 2011 10:35 AM ------      Message from: Cassell Clement      Created: Tue Aug 21, 2011  2:41 PM       Please report.  The potassium is okay at 4.6.  Okay to continue spironolactone.  The kidney function is stable.  Continue same medication. Call His wife with reports

## 2011-08-28 NOTE — Telephone Encounter (Signed)
Advised of labs 

## 2011-09-04 ENCOUNTER — Other Ambulatory Visit (INDEPENDENT_AMBULATORY_CARE_PROVIDER_SITE_OTHER): Payer: Medicare Other | Admitting: *Deleted

## 2011-09-04 ENCOUNTER — Ambulatory Visit (INDEPENDENT_AMBULATORY_CARE_PROVIDER_SITE_OTHER): Payer: Medicare Other | Admitting: Nurse Practitioner

## 2011-09-04 ENCOUNTER — Encounter: Payer: Self-pay | Admitting: Nurse Practitioner

## 2011-09-04 VITALS — BP 130/72 | HR 64 | Ht 63.0 in | Wt 165.1 lb

## 2011-09-04 DIAGNOSIS — I255 Ischemic cardiomyopathy: Secondary | ICD-10-CM | POA: Insufficient documentation

## 2011-09-04 DIAGNOSIS — I2510 Atherosclerotic heart disease of native coronary artery without angina pectoris: Secondary | ICD-10-CM

## 2011-09-04 DIAGNOSIS — I5023 Acute on chronic systolic (congestive) heart failure: Secondary | ICD-10-CM

## 2011-09-04 DIAGNOSIS — I10 Essential (primary) hypertension: Secondary | ICD-10-CM

## 2011-09-04 DIAGNOSIS — I2589 Other forms of chronic ischemic heart disease: Secondary | ICD-10-CM

## 2011-09-04 MED ORDER — TIOTROPIUM BROMIDE MONOHYDRATE 18 MCG IN CAPS: 18.0000 ug | ORAL_CAPSULE | Freq: Every day | RESPIRATORY_TRACT | Status: DC

## 2011-09-04 MED ORDER — CARVEDILOL 6.25 MG PO TABS: 6.2500 mg | ORAL_TABLET | Freq: Two times a day (BID) | ORAL | Status: DC

## 2011-09-04 MED ORDER — SPIRONOLACTONE 25 MG PO TABS: 12.5000 mg | ORAL_TABLET | ORAL | Status: DC

## 2011-09-04 MED ORDER — FUROSEMIDE 40 MG PO TABS: 40.0000 mg | ORAL_TABLET | Freq: Two times a day (BID) | ORAL | Status: DC

## 2011-09-04 MED ORDER — LISINOPRIL 5 MG PO TABS: 5.0000 mg | ORAL_TABLET | Freq: Every day | ORAL | Status: DC

## 2011-09-04 NOTE — Progress Notes (Signed)
Trevor [name] Date of Birth: 07/16/1925 Medical Record #[id number]  History of Present Illness: Trevor [name] is seen back today for a 2 week check. He is seen for Dr. Patty Sermons. He has had a recent acute on chronic systolic/diastolic heart failure. He was diuresed over 20 pounds. He is now back home from rehab. Doing very well. No chest pain. He is not using salt. No swelling. Weights have been stable at home. No bloating. He does have chronic constipation. Has stopped the Vicodin altogether. Seems to be tolerating his medicines ok.   Current Outpatient Prescriptions on File Prior to Visit  Medication Sig Dispense Refill  . acetaminophen (TYLENOL) 325 MG tablet Take 650 mg by mouth every 6 (six) hours as needed.        [name] amitriptyline (ELAVIL) 25 MG tablet Take 25 mg by mouth daily.        [name] aspirin 325 MG EC tablet Take 325 mg by mouth daily.       [name] atorvastatin (LIPITOR) 20 MG tablet Take 20 mg by mouth daily.        . calcium carbonate (OS-CAL) 600 MG TABS Take 600 mg by mouth 2 (two) times daily with a meal.        . clopidogrel (PLAVIX) 75 MG tablet Take 75 mg by mouth daily.        . ferrous sulfate 325 (65 FE) MG tablet TAKE ONE TABLET BY MOUTH EVERY DAY  90 tablet  3  . HYDROcodone-acetaminophen (VICODIN) 5-500 MG per tablet Take 1 tablet by mouth every 12 (twelve) hours as needed.       . multivitamin (THERAGRAN) per tablet Take 1 tablet by mouth daily.        . nitroGLYCERIN (NITRODUR - DOSED IN MG/24 HR) 0.4 mg/hr Place 1 patch (0.4 mg total) onto the skin daily.  90 patch  3  . NITROSTAT 0.4 MG SL tablet TAKE 1 TABLET BY MOUTH SUBLINGUALLY AS NEEDED  100 tablet  0  . potassium chloride SA (K-DUR,KLOR-CON) 20 MEQ tablet Take 20 mEq by mouth 2 (two) times daily.        [name] DISCONTD: carvedilol (COREG) 6.25 MG tablet Take 6.25 mg by mouth 2 (two) times daily with a meal.        . DISCONTD: furosemide (LASIX) 40 MG tablet Take 40 mg by mouth 2 (two) times daily.        [name] DISCONTD:  lisinopril (PRINIVIL,ZESTRIL) 5 MG tablet Take 5 mg by mouth daily.        [name] DISCONTD: spironolactone (ALDACTONE) 25 MG tablet Take 12.5 mg by mouth as directed.       [name] DISCONTD: tiotropium (SPIRIVA) 18 MCG inhalation capsule Place 18 mcg into inhaler and inhale daily.        [name] DISCONTD: furosemide (LASIX) 40 MG tablet Take 1 tablet (40 mg total) by mouth daily.  30 tablet  5    Allergies  Allergen Reactions  . Morphine And Related   . Vioxx (Rofecoxib)     Past Medical History  Diagnosis Date  . CAD (coronary artery disease)     Remote inferior MI in 1984, complicated by VFIB requiring multiple shocks, cardiogenic shock and CHF  . CHF (congestive heart failure)     Last echo in 2005 with EF 50 to 55%  . Arrhythmia     Hx of VFIB in 1984  . Abnormal nuclear cardiac imaging test     Lexiscan in Jan  2011 with EF 44% and large area of infarct involving the inferoseptal basal, inferior and inferolateral wall.   . Iron deficiency anemia   . HTN (hypertension)   . Bladder cancer   . Hip fracture   . Chronic back pain   . Right bundle branch block     Chronic right bundle-branch block.   . History of tobacco abuse   . Cellulitis      Chronic cellulitis right ankle.  . Anemia   . CAD (coronary artery disease)     With remote large inferior MI dating back to 1984 with associated VFib arrest. Was not cathed.   . Dyslipidemia   . Nonhealing nonsurgical wound      Infected, chronic nonhealing wound, right ankle  . Postoperative ileus   . Hypercholesterolemia   . Hyponatremia   . Inguinal hernia     Strangulated recurrent right inguinal hernia  . Depression   . Osteoarthritis     severe  . Chronic constipation   . Palpitations   . Systolic heart failure     EF is 25% per echo in October 2012 with global hypokinesis and posterior and inferior akinesis, as well as grade 2 diastolic dysfunction, moderate TR and mild pulmonary HTN    Past Surgical History  Procedure Date  .  Hernia repair   . Hip surgery   . Ankle surgery     past right ankle fracture    History  Smoking status  . Former Smoker -- 1.0 packs/day for 26 years  . Types: Cigarettes  . Quit date: 08/17/1983  Smokeless tobacco  . Former Neurosurgeon  . Types: Chew    History  Alcohol Use No    Family History  Problem Relation Age of Onset  . Heart disease Father   . Heart attack Father   . Hypertension Father   . Emphysema Brother   . Coronary artery disease Sister     X5 CABG still living  . Heart failure Sister   . Hypertension Mother     Review of Systems: The review of systems is positive for chronic constipation. He has a lot of arthritis. No chest pain. Breathing is good.  All other systems were reviewed and are negative.  Physical Exam: BP 130/72  Pulse 64  Ht 5\' 3"  (1.6 m)  Wt 165 lb 1.9 oz (74.898 kg)  BMI 29.25 kg/m2 Patient is very pleasant and in no acute distress. He looks so much better as compared to my last visit with him. Skin is warm and dry. Color is normal.  HEENT is unremarkable. Normocephalic/atraumatic. PERRL. Sclera are nonicteric. Neck is supple. No masses. No JVD. Lungs are fairly clear. Cardiac exam shows a regular rate and rhythm. Heart tones are distant. Abdomen is soft. Extremities are with just trace edema. Gait and ROM are intact. No gross neurologic deficits noted.   LABORATORY DATA: BMET is pending. EKG shows sinus with 1st degree AV block, RBBB, old inferior infarct. Rate is 64.    Assessment / Plan:

## 2011-09-04 NOTE — Assessment & Plan Note (Addendum)
He has an EF of 25%. Has had remote inferior MI associated with VFib arrest. Was never cathed. He is doing quite well with his current medicines. I do not think we can increase his Coreg at this time. Will check BMET today. May be able to cut back or stop his potassium replacement. He will continue with his daily weights. No salt. We will see him back in about 6 weeks. May need to give consideration for follow up myoview in the future. Patient is agreeable to this plan and will call if any problems develop in the interim.

## 2011-09-04 NOTE — Patient Instructions (Signed)
We are going to check your labs today.  Stay on your current medicines.  No salt.  Try to stay active.  We will see you in 6 weeks.  Call the [facility] office at [phone] if you have any questions, problems or concerns.

## 2011-09-04 NOTE — Assessment & Plan Note (Signed)
Blood pressure is ok.  

## 2011-09-05 ENCOUNTER — Other Ambulatory Visit: Payer: Self-pay | Admitting: *Deleted

## 2011-09-05 LAB — BASIC METABOLIC PANEL
BUN: 27 mg/dL — ABNORMAL HIGH (ref 6–23)
Chloride: 97 mEq/L (ref 96–112)
Glucose, Bld: 88 mg/dL (ref 70–99)
Potassium: 4.6 mEq/L (ref 3.5–5.1)

## 2011-09-06 ENCOUNTER — Telehealth: Payer: Self-pay | Admitting: *Deleted

## 2011-09-06 NOTE — Telephone Encounter (Signed)
Message copied by Burnell Blanks on Thu Sep 06, 2011  1:53 PM ------      Message from: Cassell Clement      Created: Wed Sep 05, 2011  1:48 PM       Please report.  The labs are stable.  Continue same meds.  Continue careful diet.

## 2011-09-06 NOTE — Telephone Encounter (Signed)
Advised of labs 

## 2011-10-03 ENCOUNTER — Telehealth: Payer: Self-pay | Admitting: Cardiology

## 2011-10-03 DIAGNOSIS — E876 Hypokalemia: Secondary | ICD-10-CM

## 2011-10-03 MED ORDER — POTASSIUM CHLORIDE CRYS ER 20 MEQ PO TBCR: 20.0000 meq | EXTENDED_RELEASE_TABLET | Freq: Two times a day (BID) | ORAL | Status: DC

## 2011-10-03 NOTE — Telephone Encounter (Signed)
Advised patient

## 2011-10-03 NOTE — Telephone Encounter (Signed)
Has dry cough, starts after using spiriva in the morning.  Sneezing a lot and eyes water at times as well.  Concerned coming from medications.  Please advise

## 2011-10-03 NOTE — Telephone Encounter (Signed)
New Msg: Pt calling with some questions about pt medication. Pt recently put on new medication and is coughing and sneezing a lot and pt doesn't know if it is related to medication. Please return pt call to discuss further.

## 2011-10-03 NOTE — Telephone Encounter (Signed)
Try leaving off the Spiriva for the next week or so and see if the symptoms improve

## 2011-10-18 ENCOUNTER — Encounter: Payer: Self-pay | Admitting: Cardiology

## 2011-10-18 ENCOUNTER — Ambulatory Visit (INDEPENDENT_AMBULATORY_CARE_PROVIDER_SITE_OTHER): Payer: Medicare Other | Admitting: Cardiology

## 2011-10-18 VITALS — BP 112/80 | HR 60 | Ht 63.0 in | Wt 163.0 lb

## 2011-10-18 DIAGNOSIS — I259 Chronic ischemic heart disease, unspecified: Secondary | ICD-10-CM

## 2011-10-18 DIAGNOSIS — I2510 Atherosclerotic heart disease of native coronary artery without angina pectoris: Secondary | ICD-10-CM

## 2011-10-18 DIAGNOSIS — J4489 Other specified chronic obstructive pulmonary disease: Secondary | ICD-10-CM

## 2011-10-18 DIAGNOSIS — F329 Major depressive disorder, single episode, unspecified: Secondary | ICD-10-CM

## 2011-10-18 DIAGNOSIS — J449 Chronic obstructive pulmonary disease, unspecified: Secondary | ICD-10-CM

## 2011-10-18 DIAGNOSIS — I255 Ischemic cardiomyopathy: Secondary | ICD-10-CM

## 2011-10-18 DIAGNOSIS — I2589 Other forms of chronic ischemic heart disease: Secondary | ICD-10-CM

## 2011-10-18 MED ORDER — LOSARTAN POTASSIUM 25 MG PO TABS: 25.0000 mg | ORAL_TABLET | Freq: Every day | ORAL | Status: DC

## 2011-10-18 MED ORDER — TIOTROPIUM BROMIDE MONOHYDRATE 18 MCG IN CAPS: 18.0000 ug | ORAL_CAPSULE | Freq: Every day | RESPIRATORY_TRACT | Status: DC

## 2011-10-18 MED ORDER — CLOPIDOGREL BISULFATE 75 MG PO TABS: 75.0000 mg | ORAL_TABLET | Freq: Every day | ORAL | Status: DC

## 2011-10-18 MED ORDER — AMITRIPTYLINE HCL 25 MG PO TABS: 25.0000 mg | ORAL_TABLET | Freq: Every day | ORAL | Status: DC

## 2011-10-18 NOTE — Patient Instructions (Signed)
Stop Lisinopril and start Losartan 25 mg daily.  Rx has been sent to [facility]  Your physician recommends that you schedule a follow-up appointment in: 2 months for office visit/bmet/ekg

## 2011-10-18 NOTE — Progress Notes (Signed)
Trevor [name] Date of Birth:  25-Jun-1925 [facility] [address]   [address] [place], [place]  [address] [phone]           Fax   [phone]  History of Present Illness: This pleasant 77 year old gentleman is seen for a scheduled followup office visit.  He was hospitalized in October for acute on chronic systolic congestive heart failure.  The hospital he diuresed 21 pounds.  He was discharged from the hospital at the end of October and spent a short time in the [facility] after which he was able to return home.  Has been feeling well recently.  He is not having any frequent chest pains.  Is not having any symptoms of recurrent congestive heart failure.  He has not been aware of any palpitations.  Current Outpatient Prescriptions  Medication Sig Dispense Refill  . acetaminophen (TYLENOL) 325 MG tablet Take 650 mg by mouth every 6 (six) hours as needed.        [name] amitriptyline (ELAVIL) 25 MG tablet Take 25 mg by mouth daily.        [name] aspirin 325 MG EC tablet Take 325 mg by mouth daily.       [name] atorvastatin (LIPITOR) 20 MG tablet Take 20 mg by mouth daily.        . calcium carbonate (OS-CAL) 600 MG TABS Take 600 mg by mouth 2 (two) times daily with a meal.        . carvedilol (COREG) 6.25 MG tablet Take 1 tablet (6.25 mg total) by mouth 2 (two) times daily with a meal.  180 tablet  3  . clopidogrel (PLAVIX) 75 MG tablet Take 75 mg by mouth daily.        . ferrous sulfate 325 (65 FE) MG tablet TAKE ONE TABLET BY MOUTH EVERY DAY  90 tablet  3  . furosemide (LASIX) 40 MG tablet Take 1 tablet (40 mg total) by mouth 2 (two) times daily.  180 tablet  3  . multivitamin (THERAGRAN) per tablet Take 1 tablet by mouth daily.        . nitroGLYCERIN (NITRODUR - DOSED IN MG/24 HR) 0.4 mg/hr Place 1 patch (0.4 mg total) onto the skin daily.  90 patch  3  . NITROSTAT 0.4 MG SL tablet TAKE 1 TABLET BY MOUTH SUBLINGUALLY AS NEEDED  100 tablet  0   . potassium chloride SA (K-DUR,KLOR-CON) 20 MEQ tablet Take 1 tablet (20 mEq total) by mouth 2 (two) times daily.  180 tablet  3  . spironolactone (ALDACTONE) 25 MG tablet Take 0.5 tablets (12.5 mg total) by mouth as directed.  90 tablet  3  . tiotropium (SPIRIVA) 18 MCG inhalation capsule Place 1 capsule (18 mcg total) into inhaler and inhale daily.  30 capsule  6  . losartan (COZAAR) 25 MG tablet Take 1 tablet (25 mg total) by mouth daily.  30 tablet  11  . DISCONTD: furosemide (LASIX) 40 MG tablet Take 1 tablet (40 mg total) by mouth daily.  30 tablet  5    Allergies  Allergen Reactions  . Morphine And Related   . Vioxx (Rofecoxib)     Patient Active Problem List  Diagnoses  . Ischemic heart disease  . Hypercholesterolemia  . Iron deficiency anemia  . Hx of bladder cancer  . Congestive heart failure with left ventricular systolic dysfunction  . Ischemic cardiomyopathy  . HTN (hypertension)    History  Smoking status  . Former Smoker -- 1.0 packs/day for 26 years  . Types: Cigarettes  . Quit date: 08/17/1983  Smokeless tobacco  . Former Neurosurgeon  . Types: Chew    History  Alcohol Use No    Family History  Problem Relation Age of Onset  . Heart disease Father   . Heart attack Father   . Hypertension Father   . Emphysema Brother   . Coronary artery disease Sister     X5 CABG still living  . Heart failure Sister   . Hypertension Mother     Review of Systems: Constitutional: no fever chills diaphoresis or fatigue or change in weight.  Head and neck: no hearing loss, no epistaxis, no photophobia or visual disturbance. Respiratory: No cough, shortness of breath or wheezing. Cardiovascular: No chest pain peripheral edema, palpitations. Gastrointestinal: No abdominal distention, no abdominal pain, no change in bowel habits hematochezia or melena. Genitourinary: No dysuria, no frequency, no urgency, no nocturia. Musculoskeletal:No arthralgias, no back pain, no gait  disturbance or myalgias. Neurological: No dizziness, no headaches, no numbness, no seizures, no syncope, no weakness, no tremors. Hematologic: No lymphadenopathy, no easy bruising. Psychiatric: No confusion, no hallucinations, no sleep disturbance.    Physical Exam: Filed Vitals:   10/18/11 1047  BP: 112/80  Pulse: 60   the general appearance reveals a well-developed well-nourished elderly gentleman in no distress.  He was able to walk into the office without assistance.  He does have significant kyphosis.Pupils equal and reactive.   Extraocular Movements are full.  There is no scleral icterus.  The mouth and pharynx are normal.  The neck is supple.  The carotids reveal no bruits.  The jugular venous pressure is normal.  The thyroid is not enlarged.  There is no lymphadenopathy.  The chest is clear to percussion and auscultation. There are no rales or rhonchi. Expansion of the chest is symmetrical.  The precordium is quiet.  The first heart sound is normal.  The second heart sound is physiologically split.  There is no murmur gallop rub or click.  There is no abnormal lift or heave.  The abdomen is soft and nontender. Bowel sounds are normal. The liver and spleen are not enlarged. There Are no abdominal masses. There are no bruits.  Extremities reveal no phlebitis or edemaStrength is normal and symmetrical in all extremities.  There is no lateralizing weakness.  There are no sensory deficits.  The skin is warm and dry.  There is no rash.    Assessment / Plan: Continue same medication except stop lisinopril and start losartan 25 mg daily.  Recheck in 2 months for followup office visit EKG and basal metabolic panel

## 2011-10-18 NOTE — Assessment & Plan Note (Signed)
The patient has had very little chest pain or angina since getting out of the hospital.  He previously had taken sublingual nitroglycerin each night at bedtime prophylactically.  Now he has been taking it just intermittently and has not been awakening with chest pain.

## 2011-10-18 NOTE — Assessment & Plan Note (Signed)
The patient was started on lisinopril in the hospital as treatment for his chronic systolic and diastolic congestive heart failure.  He has developed a dry nonproductive cough which I suspect may be a side effect of the lisinopril.  We're going to stop lisinopril and start losartan 25 mg one daily.

## 2011-10-18 NOTE — Assessment & Plan Note (Signed)
The patient has not had any fever.  Having any productive cough or sputum production.  He is back on Spiriva.  If his cough resolves by stopping his lisinopril he may not need to be on long-term Spiriva.

## 2011-10-19 ENCOUNTER — Other Ambulatory Visit: Payer: Self-pay | Admitting: Cardiology

## 2011-11-27 ENCOUNTER — Telehealth: Payer: Self-pay | Admitting: Cardiology

## 2011-11-27 MED ORDER — CHLORDIAZEPOXIDE-AMITRIPTYLINE 10-25 MG PO TABS: 1.0000 | ORAL_TABLET | Freq: Two times a day (BID) | ORAL | Status: DC

## 2011-11-27 NOTE — Telephone Encounter (Signed)
New msg: pt calling wanting to speak with MD regarding medication that pt thinks might be keeping him up at night. Pt said he doesn't know which medication is keeping him up at night. Pt would like to speak with MD to discuss, please return pt call.

## 2011-11-27 NOTE — Telephone Encounter (Signed)
Okay for him to switch back to Limbitrol which he had been on in the past

## 2011-11-27 NOTE — Telephone Encounter (Signed)
Advised patient ok to go back on this like he was taking before

## 2011-11-27 NOTE — Telephone Encounter (Signed)
Patient had been on Limbitrol for years and was changed to Elavil.  Only took a few of those and discontinued, didn't think they helped.  States when he goes to bed he is not able to go to sleep.  Has not slept well since he has been home from facility.  Will forward to  Dr. Patty Sermons for review

## 2011-12-19 ENCOUNTER — Other Ambulatory Visit: Payer: Medicare Other

## 2011-12-19 ENCOUNTER — Ambulatory Visit (INDEPENDENT_AMBULATORY_CARE_PROVIDER_SITE_OTHER): Payer: Medicare Other | Admitting: Cardiology

## 2011-12-19 ENCOUNTER — Encounter: Payer: Self-pay | Admitting: Cardiology

## 2011-12-19 VITALS — BP 124/70 | HR 62 | Ht 62.0 in | Wt 167.0 lb

## 2011-12-19 DIAGNOSIS — I259 Chronic ischemic heart disease, unspecified: Secondary | ICD-10-CM

## 2011-12-19 DIAGNOSIS — J449 Chronic obstructive pulmonary disease, unspecified: Secondary | ICD-10-CM

## 2011-12-19 DIAGNOSIS — R079 Chest pain, unspecified: Secondary | ICD-10-CM

## 2011-12-19 DIAGNOSIS — E876 Hypokalemia: Secondary | ICD-10-CM

## 2011-12-19 DIAGNOSIS — I2510 Atherosclerotic heart disease of native coronary artery without angina pectoris: Secondary | ICD-10-CM

## 2011-12-19 DIAGNOSIS — I5020 Unspecified systolic (congestive) heart failure: Secondary | ICD-10-CM

## 2011-12-19 DIAGNOSIS — E7800 Pure hypercholesterolemia, unspecified: Secondary | ICD-10-CM

## 2011-12-19 LAB — BASIC METABOLIC PANEL
CO2: 30 mEq/L (ref 19–32)
Calcium: 9.2 mg/dL (ref 8.4–10.5)
Chloride: 103 mEq/L (ref 96–112)
Creatinine, Ser: 1.2 mg/dL (ref 0.4–1.5)
Sodium: 136 mEq/L (ref 135–145)

## 2011-12-19 MED ORDER — NITROGLYCERIN 0.4 MG/HR TD PT24: 1.0000 | MEDICATED_PATCH | Freq: Every day | TRANSDERMAL | Status: DC

## 2011-12-19 MED ORDER — TIOTROPIUM BROMIDE MONOHYDRATE 18 MCG IN CAPS: 18.0000 ug | ORAL_CAPSULE | Freq: Every day | RESPIRATORY_TRACT | Status: DC

## 2011-12-19 MED ORDER — CLOPIDOGREL BISULFATE 75 MG PO TABS: 75.0000 mg | ORAL_TABLET | Freq: Every day | ORAL | Status: DC

## 2011-12-19 MED ORDER — CHLORDIAZEPOXIDE-AMITRIPTYLINE 10-25 MG PO TABS: 1.0000 | ORAL_TABLET | Freq: Two times a day (BID) | ORAL | Status: DC

## 2011-12-19 MED ORDER — LOSARTAN POTASSIUM 25 MG PO TABS: 25.0000 mg | ORAL_TABLET | Freq: Every day | ORAL | Status: DC

## 2011-12-19 MED ORDER — POTASSIUM CHLORIDE CRYS ER 20 MEQ PO TBCR: 20.0000 meq | EXTENDED_RELEASE_TABLET | Freq: Two times a day (BID) | ORAL | Status: DC

## 2011-12-19 MED ORDER — ATORVASTATIN CALCIUM 20 MG PO TABS: 20.0000 mg | ORAL_TABLET | Freq: Every day | ORAL | Status: DC

## 2011-12-19 NOTE — Assessment & Plan Note (Signed)
The patient has COPD.  He is a former smoker.  He had been having a dry cough from lisinopril and the cough has resolved almost completely since switching to losartan.  He is not coughing up any sputum.

## 2011-12-19 NOTE — Assessment & Plan Note (Signed)
The patient has a history of hypercholesterolemia.  He is on atorvastatin and is tolerating it well.  We're checking lab work today

## 2011-12-19 NOTE — Assessment & Plan Note (Signed)
The patient is not having any paroxysmal nocturnal dyspnea.  He is not having any peripheral edema.  He is tolerating his present dose of furosemide without side effects.

## 2011-12-19 NOTE — Progress Notes (Signed)
Quick Note:  Please report to patient. The recent labs are stable. Continue same medication and careful diet. ______ 

## 2011-12-19 NOTE — Patient Instructions (Signed)
Your physician recommends that you continue on your current medications as directed. Please refer to the Current Medication list given to you today.  Your physician recommends that you schedule a follow-up appointment in: 3 months  Will obtain labs today and call you with the results (bmet) 

## 2011-12-19 NOTE — Progress Notes (Signed)
Trevor [name] Date of Birth:  Dec 14, 1924 [facility] [address] [place], [place]  [address] [phone]         Fax   [phone]  History of Present Illness: This pleasant 78 year old gentleman is seen for a scheduled followup office visit.  He has a history of ischemic heart disease.  He was hospitalized in October 2012 4 acute on chronic systolic congestive heart failure and in the hospital he diuresed 21 pounds.  Since discharge from the hospital he has done well.  He has had no recurrent symptoms of congestive failure.  He is not having any increased level of chest pain.  Current Outpatient Prescriptions  Medication Sig Dispense Refill  . acetaminophen (TYLENOL) 325 MG tablet Take 650 mg by mouth every 6 (six) hours as needed.        [name] amitriptyline-chlordiazePOXIDE (LIMBITROL DS) 10-25 MG TABS Take 1 tablet by mouth 2 (two) times daily.  180 tablet  3  . aspirin 325 MG EC tablet Take 325 mg by mouth daily.       [name] atorvastatin (LIPITOR) 20 MG tablet Take 1 tablet (20 mg total) by mouth daily.  90 tablet  3  . calcium carbonate (OS-CAL) 600 MG TABS Take 600 mg by mouth 2 (two) times daily with a meal.        . carvedilol (COREG) 6.25 MG tablet Take 1 tablet (6.25 mg total) by mouth 2 (two) times daily with a meal.  180 tablet  3  . clopidogrel (PLAVIX) 75 MG tablet Take 1 tablet (75 mg total) by mouth daily.  90 tablet  3  . ferrous sulfate 325 (65 FE) MG tablet TAKE ONE TABLET BY MOUTH EVERY DAY  90 tablet  3  . furosemide (LASIX) 40 MG tablet Take 1 tablet (40 mg total) by mouth 2 (two) times daily.  180 tablet  3  . losartan (COZAAR) 25 MG tablet Take 1 tablet (25 mg total) by mouth daily.  90 tablet  3  . multivitamin (THERAGRAN) per tablet Take 1 tablet by mouth daily.        . nitroGLYCERIN (NITRODUR - DOSED IN MG/24 HR) 0.4 mg/hr Place 1 patch (0.4 mg total) onto the skin daily.  90 patch  3  . NITROSTAT 0.4 MG SL tablet TAKE 1 TABLET BY MOUTH  SUBLINGUALLY AS NEEDED  25 tablet  11  . potassium chloride SA (K-DUR,KLOR-CON) 20 MEQ tablet Take 1 tablet (20 mEq total) by mouth 2 (two) times daily.  180 tablet  3  . spironolactone (ALDACTONE) 25 MG tablet Take 0.5 tablets (12.5 mg total) by mouth as directed.  90 tablet  3  . tiotropium (SPIRIVA) 18 MCG inhalation capsule Place 1 capsule (18 mcg total) into inhaler and inhale daily.  90 capsule  3  . DISCONTD: atorvastatin (LIPITOR) 20 MG tablet Take 20 mg by mouth daily.        [name] DISCONTD: clopidogrel (PLAVIX) 75 MG tablet Take 1 tablet (75 mg total) by mouth daily.  90 tablet  3  . DISCONTD: nitroGLYCERIN (NITRODUR - DOSED IN MG/24 HR) 0.4 mg/hr Place 1 patch (0.4 mg total) onto the skin daily.  90 patch  3  . DISCONTD: potassium chloride SA (K-DUR,KLOR-CON) 20 MEQ tablet Take 1 tablet (20 mEq total) by mouth 2 (two) times daily.  180 tablet  3  . DISCONTD: tiotropium (SPIRIVA) 18 MCG inhalation capsule Place 1 capsule (18 mcg total) into inhaler and inhale daily.  90 capsule  3  . DISCONTD: furosemide (LASIX) 40 MG tablet Take 1 tablet (40 mg total) by mouth daily.  30 tablet  5    Allergies  Allergen Reactions  . Lisinopril Cough  . Morphine And Related   . Vioxx (Rofecoxib)     Patient Active Problem List  Diagnoses  . Ischemic heart disease  . Hypercholesterolemia  . Iron deficiency anemia  . Hx of bladder cancer  . Congestive heart failure with left ventricular systolic dysfunction  . Ischemic cardiomyopathy  . HTN (hypertension)  . COPD (chronic obstructive pulmonary disease)    History  Smoking status  . Former Smoker -- 1.0 packs/day for 26 years  . Types: Cigarettes  . Quit date: 08/17/1983  Smokeless tobacco  . Former Neurosurgeon  . Types: Chew    History  Alcohol Use No    Family History  Problem Relation Age of Onset  . Heart disease Father   . Heart attack Father   . Hypertension Father   . Emphysema Brother   . Coronary artery disease Sister     X5  CABG still living  . Heart failure Sister   . Hypertension Mother     Review of Systems: Constitutional: no fever chills diaphoresis or fatigue or change in weight.  Head and neck: no hearing loss, no epistaxis, no photophobia or visual disturbance. Respiratory: No cough, shortness of breath or wheezing. Cardiovascular: No chest pain peripheral edema, palpitations. Gastrointestinal: No abdominal distention, no abdominal pain, no change in bowel habits hematochezia or melena. Genitourinary: No dysuria, no frequency, no urgency, no nocturia. Musculoskeletal:No arthralgias, no back pain, no gait disturbance or myalgias. Neurological: No dizziness, no headaches, no numbness, no seizures, no syncope, no weakness, no tremors. Hematologic: No lymphadenopathy, no easy bruising. Psychiatric: No confusion, no hallucinations, no sleep disturbance.    Physical Exam: Filed Vitals:   12/19/11 1007  BP: 124/70  Pulse: 62   the general appearance reveals a well-developed well-nourished gentleman in no distress.  He has kyphoscoliosis from previous trauma to his back.Pupils equal and reactive.   Extraocular Movements are full.  There is no scleral icterus.  The mouth and pharynx are normal.  The neck is supple.  The carotids reveal no bruits.  The jugular venous pressure is normal.  The thyroid is not enlarged.  There is no lymphadenopathy.  NeurologicThe precordium is quiet.  The first heart sound is normal.  The second heart sound is physiologically split.  There is no murmur gallop rub or click.  There is no abnormal lift or heave.  The abdomen is soft and nontender. Bowel sounds are normal. The liver and spleen are not enlarged. There Are no abdominal masses. There are no bruits.  The pedal pulses are good.  There is no phlebitis or edema.  There is no cyanosis or clubbing.  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   Assessment / Plan: The patient is presently doing well on current therapy.  His weight is up 3 pounds but it does not appear to be fluid.  The patient is to continue same meds and be rechecked in 3 months

## 2011-12-20 ENCOUNTER — Telehealth: Payer: Self-pay | Admitting: *Deleted

## 2011-12-20 NOTE — Telephone Encounter (Signed)
Advised patient

## 2011-12-20 NOTE — Telephone Encounter (Signed)
Message copied by Burnell Blanks on Thu Dec 20, 2011  2:41 PM ------      Message from: Cassell Clement      Created: Wed Dec 19, 2011  5:25 PM       Please report to patient.  The recent labs are stable. Continue same medication and careful diet.

## 2011-12-26 ENCOUNTER — Telehealth: Payer: Self-pay | Admitting: Cardiology

## 2011-12-26 NOTE — Telephone Encounter (Signed)
Pt calling re medicine that didn't get called generic limbitrol , uses [facility]

## 2011-12-27 NOTE — Telephone Encounter (Signed)
Patient called wanting limbitrol refilled.Dr.Brackbill's nurse out of office today.Will forward to Dr.Brackbill's nurse.

## 2011-12-27 NOTE — Telephone Encounter (Signed)
Pt said someone returned his call today from yesterday's message and he didn't get to answer the call so he was calling back

## 2011-12-28 NOTE — Telephone Encounter (Signed)
Patient received mail order yesterday

## 2012-01-15 ENCOUNTER — Telehealth: Payer: Self-pay | Admitting: Cardiology

## 2012-03-03 ENCOUNTER — Encounter: Payer: Self-pay | Admitting: Cardiology

## 2012-03-03 ENCOUNTER — Telehealth: Payer: Self-pay | Admitting: Cardiology

## 2012-03-03 ENCOUNTER — Ambulatory Visit (INDEPENDENT_AMBULATORY_CARE_PROVIDER_SITE_OTHER): Payer: Medicare Other | Admitting: Cardiology

## 2012-03-03 VITALS — BP 126/76 | HR 60 | Ht 62.0 in | Wt 166.0 lb

## 2012-03-03 DIAGNOSIS — I259 Chronic ischemic heart disease, unspecified: Secondary | ICD-10-CM

## 2012-03-03 DIAGNOSIS — I519 Heart disease, unspecified: Secondary | ICD-10-CM

## 2012-03-03 DIAGNOSIS — I509 Heart failure, unspecified: Secondary | ICD-10-CM

## 2012-03-03 DIAGNOSIS — D649 Anemia, unspecified: Secondary | ICD-10-CM

## 2012-03-03 DIAGNOSIS — R05 Cough: Secondary | ICD-10-CM

## 2012-03-03 DIAGNOSIS — M419 Scoliosis, unspecified: Secondary | ICD-10-CM

## 2012-03-03 DIAGNOSIS — I2510 Atherosclerotic heart disease of native coronary artery without angina pectoris: Secondary | ICD-10-CM

## 2012-03-03 DIAGNOSIS — J449 Chronic obstructive pulmonary disease, unspecified: Secondary | ICD-10-CM

## 2012-03-03 DIAGNOSIS — M4120 Other idiopathic scoliosis, site unspecified: Secondary | ICD-10-CM

## 2012-03-03 DIAGNOSIS — I5020 Unspecified systolic (congestive) heart failure: Secondary | ICD-10-CM

## 2012-03-03 NOTE — Progress Notes (Signed)
Trevor [name] Date of Birth:  10-Dec-1924 [facility] [address] [place], [place]  [address] [phone]  Fax   [phone]  HPI: This pleasant 78 year old gentleman is seen as a work in the office visit.  He has a past history of severe ischemic heart disease as well as COPD.  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  He was hospitalized in October 2012 for acute on chronic systolic congestive heart failure and diuresed 21 pounds in the hospital.  Since discharge she has been able to keep the fluid off and he said no evidence of recurrent CHF.  The past month he has had pain which is worse when he coughs or moves.  He has not had any sputum or fever and is having no paroxysmal nocturnal dyspnea.  His wife has noted that he appears to be increasingly kyphotic.  Current Outpatient Prescriptions  Medication Sig Dispense Refill  . acetaminophen (TYLENOL) 325 MG tablet Take 650 mg by mouth every 6 (six) hours as needed.        [name] amitriptyline-chlordiazePOXIDE (LIMBITROL DS) 10-25 MG TABS Take 1 tablet by mouth 2 (two) times daily.  180 tablet  3  . aspirin 325 MG EC tablet Take 325 mg by mouth daily.       [name] atorvastatin (LIPITOR) 20 MG tablet Take 1 tablet (20 mg total) by mouth daily.  90 tablet  3  . calcium carbonate (OS-CAL) 600 MG TABS Take 600 mg by mouth 2 (two) times daily with a meal.        . carvedilol (COREG) 6.25 MG tablet Take 1 tablet (6.25 mg total) by mouth 2 (two) times daily with a meal.  180 tablet  3  . clopidogrel (PLAVIX) 75 MG tablet Take 1 tablet (75 mg total) by mouth daily.  90 tablet  3  . ferrous sulfate 325 (65 FE) MG tablet TAKE ONE TABLET BY MOUTH EVERY DAY  90 tablet  3  . furosemide (LASIX) 40 MG tablet Take 1 tablet (40 mg total) by mouth 2 (two) times daily.  180 tablet  3  . losartan (COZAAR) 25 MG tablet Take 1 tablet (25 mg total) by mouth daily.  90 tablet  3    . multivitamin (THERAGRAN) per tablet Take 1 tablet by mouth daily.        . nitroGLYCERIN (NITRODUR - DOSED IN MG/24 HR) 0.4 mg/hr Place 1 patch (0.4 mg total) onto the skin daily.  90 patch  3  . NITROSTAT 0.4 MG SL tablet TAKE 1 TABLET BY MOUTH SUBLINGUALLY AS NEEDED  25 tablet  11  . potassium chloride SA (K-DUR,KLOR-CON) 20 MEQ tablet Take 1 tablet (20 mEq total) by mouth 2 (two) times daily.  180 tablet  3  . spironolactone (ALDACTONE) 25 MG tablet Take 0.5 tablets (12.5 mg total) by mouth as directed.  90 tablet  3  . tiotropium (SPIRIVA) 18 MCG inhalation capsule Place 1 capsule (18 mcg total) into inhaler and inhale daily.  90 capsule  3  . DISCONTD: furosemide (LASIX) 40 MG tablet Take 1 tablet (40 mg total) by mouth daily.  30 tablet  5    Allergies  Allergen Reactions  . Lisinopril Cough  . Morphine And Related   . Vioxx (Rofecoxib)     Patient Active Problem List  Diagnoses  . Ischemic heart disease  . Hypercholesterolemia  . Iron deficiency anemia  . Hx of  bladder cancer  . Congestive heart failure with left ventricular systolic dysfunction  . Ischemic cardiomyopathy  . HTN (hypertension)  . COPD (chronic obstructive pulmonary disease)    History  Smoking status  . Former Smoker -- 1.0 packs/day for 26 years  . Types: Cigarettes  . Quit date: 08/17/1983  Smokeless tobacco  . Former Neurosurgeon  . Types: Chew    History  Alcohol Use No    Family History  Problem Relation Age of Onset  . Heart disease Father   . Heart attack Father   . Hypertension Father   . Emphysema Brother   . Coronary artery disease Sister     X5 CABG still living  . Heart failure Sister   . Hypertension Mother     Review of Systems: The patient denies any heat or cold intolerance.  No weight gain or weight loss.  The patient denies headaches or blurry vision.  There is no cough or sputum production.  The patient denies dizziness.  There is no hematuria or hematochezia.  The patient  denies any muscle aches or arthritis.  The patient denies any rash.  The patient denies frequent falling or instability.  There is no history of depression or anxiety.  All other systems were reviewed and are negative.   Physical Exam: Filed Vitals:   03/03/12 1607  BP: 126/76  Pulse: 60   the general appearance reveals a well-developed well-nourished elderly gentleman who has severe kyphoscoliosis.Pupils equal and reactive.   Extraocular Movements are full.  There is no scleral icterus.  The mouth and pharynx are normal.  The neck is supple.  The carotids reveal no bruits.  The jugular venous pressure is normal.  The thyroid is not enlarged.  There is no lymphadenopathy.  The chest reveals no rales or rhonchiThe precordium is quiet.  The first heart sound is normal.  The second heart sound is physiologically split.  There is no murmur gallop rub or click.  There is no abnormal lift or heave.  The abdomen is soft and nontender. Bowel sounds are normal. The liver and spleen are not enlarged. There Are no abdominal masses. There are no bruits.  The pedal pulses are good.  There is no phlebitis or edema.  There is no cyanosis or clubbing. Strength is normal and symmetrical in all extremities.  There is no lateralizing weakness.  There are no sensory deficits.      Assessment / Plan:  His chest pain appears to be musculoskeletal worse with coughing or twisting or moving.  I believe that he probably has had another compression fracture of the thoracic spine causing his recent increase in chest wall pain.  The patient does not want to go back on something potentially addicting such as Vicodin.  His wife concurs with that.  We will try Aleve if Tylenol does not work. Recheck in 3 months for followup office visit and fasting lab work including CBC lipid panel hepatic function panel and basal metabolic panel

## 2012-03-03 NOTE — Assessment & Plan Note (Signed)
The patient denies any purulent sputum or hemoptysis.  He is having chest pain of a musculoskeletal type when he coughs

## 2012-03-03 NOTE — Patient Instructions (Signed)
Try Aleve or Tylenol for chest and back pain.  Call back if worse or no better  Your physician recommends that you schedule a follow-up appointment in: 3 months with fasting labs (LP/BMET/HFP/CBC)

## 2012-03-03 NOTE — Assessment & Plan Note (Signed)
No evidence of any increased fluid retention.  His weight is down 1 pound since last visit

## 2012-03-03 NOTE — Telephone Encounter (Signed)
Scheduled appointment for this afternoon. 

## 2012-03-03 NOTE — Telephone Encounter (Signed)
PT HAS APPT 5-29, BUT CANT WAIT THAT LONG, PT HAVING CHEST PAIN, COUGHING HURTS, [phone] WIFE WANTS TO TALK TO MELINDA

## 2012-03-03 NOTE — Assessment & Plan Note (Signed)
The patient has not been experiencing any recurrent angina pectoris. 

## 2012-03-19 ENCOUNTER — Ambulatory Visit: Payer: Medicare Other | Admitting: Cardiology

## 2012-04-01 ENCOUNTER — Telehealth: Payer: Self-pay | Admitting: *Deleted

## 2012-04-01 NOTE — Telephone Encounter (Signed)
Patients wife came in for an appointment today and brought note for  Dr. Patty Sermons to review.  Per note, patient has been experiencing low blood pressure, moves around like a zombie moving, walking, and talking in slow motion.  He has also been experiencing a cough.   Dr. Patty Sermons recommended decreasing his Limbitrol to once a day at bedtime and leaving off Losartan for several weeks to see if cough resolves.  Advised wife

## 2012-04-23 ENCOUNTER — Inpatient Hospital Stay (HOSPITAL_COMMUNITY): Payer: Medicare Other

## 2012-04-23 ENCOUNTER — Telehealth: Payer: Self-pay | Admitting: Cardiology

## 2012-04-23 ENCOUNTER — Encounter (HOSPITAL_COMMUNITY): Payer: Self-pay | Admitting: *Deleted

## 2012-04-23 ENCOUNTER — Inpatient Hospital Stay (HOSPITAL_COMMUNITY)
Admission: AD | Admit: 2012-04-23 | Discharge: 2012-05-19 | DRG: 308 | Disposition: A | Discharge status: Skilled Nursing Facility | Payer: Medicare Other | Source: Ambulatory Visit | Attending: Cardiology | Admitting: Cardiology

## 2012-04-23 ENCOUNTER — Ambulatory Visit (INDEPENDENT_AMBULATORY_CARE_PROVIDER_SITE_OTHER): Payer: Medicare Other | Admitting: Cardiology

## 2012-04-23 ENCOUNTER — Encounter: Payer: Self-pay | Admitting: Cardiology

## 2012-04-23 VITALS — BP 110/70 | HR 80 | Ht 62.0 in | Wt 156.0 lb

## 2012-04-23 DIAGNOSIS — I4891 Unspecified atrial fibrillation: Secondary | ICD-10-CM

## 2012-04-23 DIAGNOSIS — J9600 Acute respiratory failure, unspecified whether with hypoxia or hypercapnia: Secondary | ICD-10-CM | POA: Diagnosis present

## 2012-04-23 DIAGNOSIS — I259 Chronic ischemic heart disease, unspecified: Secondary | ICD-10-CM

## 2012-04-23 DIAGNOSIS — E7800 Pure hypercholesterolemia, unspecified: Secondary | ICD-10-CM

## 2012-04-23 DIAGNOSIS — M1990 Unspecified osteoarthritis, unspecified site: Secondary | ICD-10-CM | POA: Diagnosis present

## 2012-04-23 DIAGNOSIS — G8929 Other chronic pain: Secondary | ICD-10-CM | POA: Diagnosis present

## 2012-04-23 DIAGNOSIS — R0902 Hypoxemia: Secondary | ICD-10-CM | POA: Diagnosis present

## 2012-04-23 DIAGNOSIS — I472 Ventricular tachycardia, unspecified: Secondary | ICD-10-CM | POA: Diagnosis not present

## 2012-04-23 DIAGNOSIS — E46 Unspecified protein-calorie malnutrition: Secondary | ICD-10-CM | POA: Diagnosis present

## 2012-04-23 DIAGNOSIS — R4182 Altered mental status, unspecified: Secondary | ICD-10-CM | POA: Diagnosis not present

## 2012-04-23 DIAGNOSIS — I442 Atrioventricular block, complete: Secondary | ICD-10-CM | POA: Diagnosis not present

## 2012-04-23 DIAGNOSIS — Z9181 History of falling: Secondary | ICD-10-CM

## 2012-04-23 DIAGNOSIS — Z8551 Personal history of malignant neoplasm of bladder: Secondary | ICD-10-CM

## 2012-04-23 DIAGNOSIS — I10 Essential (primary) hypertension: Secondary | ICD-10-CM

## 2012-04-23 DIAGNOSIS — I214 Non-ST elevation (NSTEMI) myocardial infarction: Secondary | ICD-10-CM

## 2012-04-23 DIAGNOSIS — R579 Shock, unspecified: Secondary | ICD-10-CM

## 2012-04-23 DIAGNOSIS — I498 Other specified cardiac arrhythmias: Secondary | ICD-10-CM | POA: Diagnosis not present

## 2012-04-23 DIAGNOSIS — D509 Iron deficiency anemia, unspecified: Secondary | ICD-10-CM

## 2012-04-23 DIAGNOSIS — IMO0002 Reserved for concepts with insufficient information to code with codable children: Secondary | ICD-10-CM

## 2012-04-23 DIAGNOSIS — F3289 Other specified depressive episodes: Secondary | ICD-10-CM | POA: Diagnosis present

## 2012-04-23 DIAGNOSIS — I252 Old myocardial infarction: Secondary | ICD-10-CM

## 2012-04-23 DIAGNOSIS — J9601 Acute respiratory failure with hypoxia: Secondary | ICD-10-CM

## 2012-04-23 DIAGNOSIS — I5020 Unspecified systolic (congestive) heart failure: Secondary | ICD-10-CM

## 2012-04-23 DIAGNOSIS — R5381 Other malaise: Secondary | ICD-10-CM | POA: Diagnosis present

## 2012-04-23 DIAGNOSIS — T426X5A Adverse effect of other antiepileptic and sedative-hypnotic drugs, initial encounter: Secondary | ICD-10-CM | POA: Diagnosis not present

## 2012-04-23 DIAGNOSIS — J449 Chronic obstructive pulmonary disease, unspecified: Secondary | ICD-10-CM

## 2012-04-23 DIAGNOSIS — Z7902 Long term (current) use of antithrombotics/antiplatelets: Secondary | ICD-10-CM

## 2012-04-23 DIAGNOSIS — M549 Dorsalgia, unspecified: Secondary | ICD-10-CM | POA: Diagnosis present

## 2012-04-23 DIAGNOSIS — D6489 Other specified anemias: Secondary | ICD-10-CM | POA: Diagnosis present

## 2012-04-23 DIAGNOSIS — Z66 Do not resuscitate: Secondary | ICD-10-CM | POA: Diagnosis not present

## 2012-04-23 DIAGNOSIS — I248 Other forms of acute ischemic heart disease: Secondary | ICD-10-CM | POA: Diagnosis not present

## 2012-04-23 DIAGNOSIS — E785 Hyperlipidemia, unspecified: Secondary | ICD-10-CM | POA: Diagnosis present

## 2012-04-23 DIAGNOSIS — F329 Major depressive disorder, single episode, unspecified: Secondary | ICD-10-CM | POA: Diagnosis present

## 2012-04-23 DIAGNOSIS — Z87891 Personal history of nicotine dependence: Secondary | ICD-10-CM

## 2012-04-23 DIAGNOSIS — Y9210 Unspecified residential institution as the place of occurrence of the external cause: Secondary | ICD-10-CM | POA: Diagnosis not present

## 2012-04-23 DIAGNOSIS — Z8249 Family history of ischemic heart disease and other diseases of the circulatory system: Secondary | ICD-10-CM

## 2012-04-23 DIAGNOSIS — Z7982 Long term (current) use of aspirin: Secondary | ICD-10-CM

## 2012-04-23 DIAGNOSIS — I2589 Other forms of chronic ischemic heart disease: Secondary | ICD-10-CM | POA: Diagnosis present

## 2012-04-23 DIAGNOSIS — J4489 Other specified chronic obstructive pulmonary disease: Secondary | ICD-10-CM | POA: Diagnosis present

## 2012-04-23 DIAGNOSIS — I5023 Acute on chronic systolic (congestive) heart failure: Secondary | ICD-10-CM | POA: Diagnosis present

## 2012-04-23 DIAGNOSIS — R570 Cardiogenic shock: Secondary | ICD-10-CM | POA: Diagnosis not present

## 2012-04-23 DIAGNOSIS — R5383 Other fatigue: Secondary | ICD-10-CM

## 2012-04-23 DIAGNOSIS — I509 Heart failure, unspecified: Secondary | ICD-10-CM

## 2012-04-23 DIAGNOSIS — I2489 Other forms of acute ischemic heart disease: Secondary | ICD-10-CM | POA: Diagnosis not present

## 2012-04-23 DIAGNOSIS — T43205A Adverse effect of unspecified antidepressants, initial encounter: Secondary | ICD-10-CM | POA: Diagnosis not present

## 2012-04-23 DIAGNOSIS — J9692 Respiratory failure, unspecified with hypercapnia: Secondary | ICD-10-CM

## 2012-04-23 DIAGNOSIS — I469 Cardiac arrest, cause unspecified: Secondary | ICD-10-CM | POA: Diagnosis not present

## 2012-04-23 DIAGNOSIS — I441 Atrioventricular block, second degree: Secondary | ICD-10-CM | POA: Diagnosis not present

## 2012-04-23 DIAGNOSIS — I959 Hypotension, unspecified: Secondary | ICD-10-CM

## 2012-04-23 DIAGNOSIS — K5900 Constipation, unspecified: Secondary | ICD-10-CM | POA: Diagnosis not present

## 2012-04-23 DIAGNOSIS — Z79899 Other long term (current) drug therapy: Secondary | ICD-10-CM

## 2012-04-23 DIAGNOSIS — I2510 Atherosclerotic heart disease of native coronary artery without angina pectoris: Secondary | ICD-10-CM

## 2012-04-23 DIAGNOSIS — I4729 Other ventricular tachycardia: Secondary | ICD-10-CM | POA: Diagnosis not present

## 2012-04-23 DIAGNOSIS — I255 Ischemic cardiomyopathy: Secondary | ICD-10-CM

## 2012-04-23 DIAGNOSIS — E876 Hypokalemia: Secondary | ICD-10-CM

## 2012-04-23 LAB — PROTIME-INR
INR: 1.3 (ref 0.00–1.49)
INR: 1.3 (ref 0.00–1.49)
Prothrombin Time: 16.4 seconds — ABNORMAL HIGH (ref 11.6–15.2)

## 2012-04-23 LAB — MRSA PCR SCREENING: MRSA by PCR: NEGATIVE

## 2012-04-23 LAB — CBC WITH DIFFERENTIAL/PLATELET
Eosinophils Absolute: 0 10*3/uL (ref 0.0–0.7)
HCT: 32.2 % — ABNORMAL LOW (ref 39.0–52.0)
Hemoglobin: 10.4 g/dL — ABNORMAL LOW (ref 13.0–17.0)
Lymphocytes Relative: 28 % (ref 12–46)
Lymphs Abs: 1.9 10*3/uL (ref 0.7–4.0)
Monocytes Absolute: 0.8 10*3/uL (ref 0.1–1.0)
Monocytes Relative: 12 % (ref 3–12)
Neutro Abs: 4 10*3/uL (ref 1.7–7.7)
Neutrophils Relative %: 59 % (ref 43–77)
RBC: 4.14 MIL/uL — ABNORMAL LOW (ref 4.22–5.81)
WBC: 6.7 10*3/uL (ref 4.0–10.5)

## 2012-04-23 LAB — COMPREHENSIVE METABOLIC PANEL
ALT: 34 U/L (ref 0–53)
AST: 61 U/L — ABNORMAL HIGH (ref 0–37)
Albumin: 2.6 g/dL — ABNORMAL LOW (ref 3.5–5.2)
Alkaline Phosphatase: 129 U/L — ABNORMAL HIGH (ref 39–117)
CO2: 34 mEq/L — ABNORMAL HIGH (ref 19–32)
Chloride: 98 mEq/L (ref 96–112)
Creatinine, Ser: 1.02 mg/dL (ref 0.50–1.35)
GFR calc non Af Amer: 65 mL/min — ABNORMAL LOW (ref >90)
Glucose, Bld: 108 mg/dL — ABNORMAL HIGH (ref 70–99)
Potassium: 4.6 mEq/L (ref 3.5–5.1)
Sodium: 140 mEq/L (ref 135–145)
Total Bilirubin: 1.1 mg/dL (ref 0.3–1.2)

## 2012-04-23 LAB — GLUCOSE, CAPILLARY: Glucose-Capillary: 98 mg/dL (ref 70–99)

## 2012-04-23 LAB — CARDIAC PANEL(CRET KIN+CKTOT+MB+TROPI)
CK, MB: 3.7 ng/mL (ref 0.3–4.0)
Relative Index: INVALID (ref 0.0–2.5)
Total CK: 34 U/L (ref 7–232)
Troponin I: <0.3 ng/mL (ref <0.30)
Troponin I: <0.3 ng/mL (ref <0.30)

## 2012-04-23 MED ORDER — ATORVASTATIN CALCIUM 20 MG PO TABS
20.0000 mg | ORAL_TABLET | Freq: Every day | ORAL | Status: DC
  Administered 2012-04-23 – 2012-05-03 (×10): 20 mg via ORAL
  Filled 2012-04-23 (×12): qty 1

## 2012-04-23 MED ORDER — ASPIRIN EC 325 MG PO TBEC
325.0000 mg | DELAYED_RELEASE_TABLET | Freq: Every day | ORAL | Status: DC
  Administered 2012-04-23 – 2012-04-24 (×2): 325 mg via ORAL
  Filled 2012-04-23 (×3): qty 1

## 2012-04-23 MED ORDER — ACETAMINOPHEN 325 MG PO TABS
650.0000 mg | ORAL_TABLET | ORAL | Status: DC | PRN
  Administered 2012-04-23 – 2012-05-02 (×8): 650 mg via ORAL
  Filled 2012-04-23 (×8): qty 2

## 2012-04-23 MED ORDER — ENSURE COMPLETE PO LIQD
237.0000 mL | Freq: Two times a day (BID) | ORAL | Status: DC
  Administered 2012-04-24 – 2012-04-25 (×2): 237 mL via ORAL

## 2012-04-23 MED ORDER — CARVEDILOL 6.25 MG PO TABS
6.2500 mg | ORAL_TABLET | Freq: Two times a day (BID) | ORAL | Status: DC
  Administered 2012-04-23 – 2012-04-25 (×5): 6.25 mg via ORAL
  Filled 2012-04-23 (×6): qty 1

## 2012-04-23 MED ORDER — ONDANSETRON HCL 4 MG/2ML IJ SOLN: 4.0000 mg | Freq: Four times a day (QID) | INTRAMUSCULAR | Status: DC | PRN

## 2012-04-23 MED ORDER — LEVOTHYROXINE SODIUM 25 MCG PO TABS
25.0000 ug | ORAL_TABLET | Freq: Every day | ORAL | Status: DC
  Administered 2012-04-23 – 2012-04-28 (×4): 25 ug via ORAL
  Filled 2012-04-23 (×6): qty 1

## 2012-04-23 MED ORDER — POTASSIUM CHLORIDE CRYS ER 20 MEQ PO TBCR
20.0000 meq | EXTENDED_RELEASE_TABLET | Freq: Every day | ORAL | Status: DC
  Administered 2012-04-24 – 2012-04-25 (×2): 20 meq via ORAL
  Filled 2012-04-23 (×2): qty 1

## 2012-04-23 MED ORDER — CALCIUM CARBONATE-VITAMIN D 500-200 MG-UNIT PO TABS
1.0000 | ORAL_TABLET | Freq: Two times a day (BID) | ORAL | Status: DC
  Administered 2012-04-23 – 2012-05-05 (×21): 1 via ORAL
  Filled 2012-04-23 (×27): qty 1

## 2012-04-23 MED ORDER — BIOTENE DRY MOUTH MT LIQD
15.0000 mL | Freq: Two times a day (BID) | OROMUCOSAL | Status: DC
  Administered 2012-04-24 – 2012-05-07 (×24): 15 mL via OROMUCOSAL

## 2012-04-23 MED ORDER — CLOPIDOGREL BISULFATE 75 MG PO TABS
75.0000 mg | ORAL_TABLET | Freq: Every day | ORAL | Status: DC
  Administered 2012-04-24 – 2012-05-07 (×12): 75 mg via ORAL
  Filled 2012-04-23 (×16): qty 1

## 2012-04-23 MED ORDER — FUROSEMIDE 40 MG PO TABS
40.0000 mg | ORAL_TABLET | Freq: Two times a day (BID) | ORAL | Status: DC
  Administered 2012-04-23 – 2012-04-24 (×3): 40 mg via ORAL
  Filled 2012-04-23 (×6): qty 1

## 2012-04-23 MED ORDER — SODIUM CHLORIDE 0.9 % IJ SOLN
3.0000 mL | Freq: Two times a day (BID) | INTRAMUSCULAR | Status: DC
  Administered 2012-04-23 – 2012-04-24 (×3): 3 mL via INTRAVENOUS
  Administered 2012-04-25: 23:00:00 via INTRAVENOUS
  Administered 2012-04-26 – 2012-04-28 (×6): 3 mL via INTRAVENOUS
  Administered 2012-04-28: 09:00:00 via INTRAVENOUS
  Administered 2012-04-29 – 2012-05-01 (×5): 3 mL via INTRAVENOUS
  Administered 2012-05-02: 10 mL via INTRAVENOUS
  Administered 2012-05-02 – 2012-05-18 (×30): 3 mL via INTRAVENOUS

## 2012-04-23 MED ORDER — TIOTROPIUM BROMIDE MONOHYDRATE 18 MCG IN CAPS
18.0000 ug | ORAL_CAPSULE | Freq: Every day | RESPIRATORY_TRACT | Status: DC
  Administered 2012-04-24: 18 ug via RESPIRATORY_TRACT
  Filled 2012-04-23: qty 5

## 2012-04-23 MED ORDER — SODIUM CHLORIDE 0.9 % IV SOLN
250.0000 mL | INTRAVENOUS | Status: DC | PRN
  Administered 2012-04-25: 10 mL/h via INTRAVENOUS
  Administered 2012-05-06: 20 mL via INTRAVENOUS

## 2012-04-23 MED ORDER — FERROUS SULFATE 325 (65 FE) MG PO TABS
325.0000 mg | ORAL_TABLET | Freq: Every day | ORAL | Status: DC
  Administered 2012-04-24 – 2012-05-05 (×10): 325 mg via ORAL
  Filled 2012-04-23 (×16): qty 1

## 2012-04-23 MED ORDER — LIVING BETTER WITH HEART FAILURE BOOK
Freq: Once | Status: DC
  Filled 2012-04-23: qty 1

## 2012-04-23 MED ORDER — SODIUM CHLORIDE 0.9 % IJ SOLN: 3.0000 mL | INTRAMUSCULAR | Status: DC | PRN

## 2012-04-23 MED ORDER — SPIRONOLACTONE 12.5 MG HALF TABLET
12.5000 mg | ORAL_TABLET | Freq: Every day | ORAL | Status: DC
  Administered 2012-04-23 – 2012-04-25 (×3): 12.5 mg via ORAL
  Filled 2012-04-23 (×3): qty 1

## 2012-04-23 NOTE — Addendum Note (Signed)
Addended by: Cassell Clement on: 04/23/2012 03:38 PM   Modules accepted: Orders

## 2012-04-23 NOTE — Telephone Encounter (Signed)
Since last OV his gait is so bad he walks with a walker, slurred speech, motor skills worse, and unable to dress himself.  Eyes and mouth swollen.  Scheduled appointment with  Dr. Patty Sermons today, advised wife

## 2012-04-23 NOTE — Progress Notes (Signed)
04/23/2012 1555 Nursing note Upon arrival to floor, pt noted to be very lethargic. Pt unable to carry on complete sentence without falling asleep. BP 98/58. HR afib 64 but dipping down to 37 several times. Non-sustaining. CBG 98. Ward Givens NP called and orders received. Transfer pt to step-down. Report called to [place]. Pt. Transferred to 2604 per orders. Will continue to monitor.  Carter Kassel, Blanchard Kelch

## 2012-04-23 NOTE — Progress Notes (Signed)
Trevor [name] is an 78 y.o. male.   Chief Complaint: Weakness, slurred speech, atrial fibrillation new HPI: This 78 year old gentleman was seen in the office today as a work in.  His family brought him in because of mental changes sluggishness slurred speech increased shortness of breath and swollen lips.  He has a past history of known ischemic heart disease.  He has a history of a remote inferior wall myocardial infarction and he has left ventricular systolic dysfunction secondary to that.  He was hospitalized in October 2012 for acute on chronic systolic congestive heart failure and diuresed 21 pounds in the hospital.  Of note was the fact that in October a TSH was slightly elevated at 8.  He has not been on any thyroid replacement hormone.  He comes in now complaining of dryness of his body, dry throat, slurred speech, swollen lips.  He has had very little energy and tends to fall sleep easily.  He appears to be increasingly kyphotic.  He has not been experiencing any recent anginal type chest pain but does have a lot of chest wall soreness.  Past Medical History  Diagnosis Date  . CAD (coronary artery disease)     Remote inferior MI in 1984, complicated by VFIB requiring multiple shocks, cardiogenic shock and CHF  . CHF (congestive heart failure)     Last echo in 2005 with EF 50 to 55%  . Arrhythmia     Hx of VFIB in 1984  . Abnormal nuclear cardiac imaging test     Lexiscan in Jan 2011 with EF 44% and large area of infarct involving the inferoseptal basal, inferior and inferolateral wall.   . Iron deficiency anemia   . HTN (hypertension)   . Bladder cancer   . Hip fracture   . Chronic back pain   . Right bundle branch block     Chronic right bundle-branch block.   . History of tobacco abuse   . Cellulitis      Chronic cellulitis right ankle.  . Anemia   . CAD (coronary artery disease)     With remote large inferior MI dating back to 1984 with associated VFib arrest. Was not  cathed.   . Dyslipidemia   . Nonhealing nonsurgical wound      Infected, chronic nonhealing wound, right ankle  . Postoperative ileus   . Hypercholesterolemia   . Hyponatremia   . Inguinal hernia     Strangulated recurrent right inguinal hernia  . Depression   . Osteoarthritis     severe  . Chronic constipation   . Palpitations   . Systolic heart failure     EF is 25% per echo in October 2012 with global hypokinesis and posterior and inferior akinesis, as well as grade 2 diastolic dysfunction, moderate TR and mild pulmonary HTN    Past Surgical History  Procedure Date  . Hernia repair   . Hip surgery   . Ankle surgery     past right ankle fracture    Family History  Problem Relation Age of Onset  . Heart disease Father   . Heart attack Father   . Hypertension Father   . Emphysema Brother   . Coronary artery disease Sister     X5 CABG still living  . Heart failure Sister   . Hypertension Mother    Social History:  reports that he quit smoking about 28 years ago. His smoking use included Cigarettes. He has a 26 pack-year smoking history. He  has quit using smokeless tobacco. His smokeless tobacco use included Chew. He reports that he does not drink alcohol or use illicit drugs.  Allergies:  Allergies  Allergen Reactions  . Lisinopril Cough  . Morphine And Related   . Vioxx (Rofecoxib)      (Not in a hospital admission)  No results found for this or any previous visit (from the past 48 hour(s)). No results found.  Review of systems: The patient has had constipation relieved by milk of magnesia.  Patient has had a very poor appetite and has had weight loss.  All other systems negative in detail.  He has been dyspneic.  Room-air oxygen saturation here in the office is 95.  Blood pressure 110/70, pulse 80, height 5\' 2"  (1.575 m), weight 156 lb (70.761 kg). The general appearance reveals a very sluggish appearing gentleman sitting in a chair and go to sleep when not  stimulated.  His voice is very hoarse.  The jugular venous pressure is elevated.  The chest is kyphotic.  Inspiratory rales are heard at the bases The heart reveals distant heart tones without gallop.  Pulse is irregular in atrial fibrillation The abdomen is soft and nontender. Extremities reveal 1+ pretibial and ankle edema Neurological reveals decreased mentation  EKG done in the office shows atrial fibrillation which is new.  He has a evidence of right bundle branch block and an old inferior wall myocardial infarction  Assessment/Plan 1.  marked lethargy.  I am concerned that he may be severely hypothyroid.  The patient states that he fell and hit his head last week and we will also check CT scan of the head 2. ischemic heart disease with old inferior wall myocardial infarction and compensated chronic systolic congestive heart failure. 3.  COPD  Plan: Admit to telemetry.  At baseline labs including thyroid function studies.  Get CT scan of the head.  Update two-dimensional echocardiogram in regard to his atrial fibrillation.  Consider short-term IV heparin but probably not a good long-term Coumadin candidate because of his weakness and history of falls.  Cassell Clement 04/23/2012, 2:29 PM

## 2012-04-23 NOTE — Telephone Encounter (Signed)
New Problem:    Patient's wife called in because her husband is still sleeping round the clock and has developed edema.  Please call her back at the office.

## 2012-04-23 NOTE — Progress Notes (Signed)
For ct scan. Ok to travel off monitor per Ward Givens, PA

## 2012-04-23 NOTE — Progress Notes (Signed)
   S: Called by RN earlier that pt arrived from office and was lethargic/groggy with intermittent bradycardia (in afib).  She asked if he could be transferred to stepdown for higher level of care.  I have now evaluated pt and he remains lethargic but is alert and responsive.  He is getting ready to go for head CT.  VSS.  O:   Filed Vitals:   04/23/12 1714  BP: 98/46  Pulse: 61  Temp: 97.5 F (36.4 C)  Resp: 27   AAOx3, lethargic.  Follows commands.  Pleasant, nad, flat affect.  Neck-no jvd, lungs-cta, cor-ir, ir, abd-soft, nt, nt, ext-1+ bilat LEE.  A/P:  1. Lethargy:  Work-up as prev outlined by Dr. Patty Sermons.  Head CT tonight.  TFT's.  All labs pending @ this time.  Keep in stepdown overnight.  2. AFib: rate controlled.  Not felt to be a good long-term coumadin candidate.  Follow.  Nicolasa Ducking, NP

## 2012-04-23 NOTE — Progress Notes (Signed)
Patient admitted from office with lethargy, signs of possible hypothyroidism. Thyroid studies pending. Will start low dose thyroid empirically now and adjust after results are back.  Consider endocrine consult if he turns out to be profoundly hypothyroid. If head CT okay would use IV  Heparin shortterm for atrial fib.  Atrial fib is a new rhythm for him. Patient presently in xray.

## 2012-04-23 NOTE — Addendum Note (Signed)
Addended by: Cassell Clement on: 04/23/2012 03:44 PM   Modules accepted: Orders

## 2012-04-24 ENCOUNTER — Inpatient Hospital Stay (HOSPITAL_COMMUNITY): Payer: Medicare Other

## 2012-04-24 DIAGNOSIS — J9692 Respiratory failure, unspecified with hypercapnia: Secondary | ICD-10-CM

## 2012-04-24 DIAGNOSIS — J9600 Acute respiratory failure, unspecified whether with hypoxia or hypercapnia: Secondary | ICD-10-CM

## 2012-04-24 DIAGNOSIS — J449 Chronic obstructive pulmonary disease, unspecified: Secondary | ICD-10-CM

## 2012-04-24 DIAGNOSIS — I4891 Unspecified atrial fibrillation: Principal | ICD-10-CM

## 2012-04-24 DIAGNOSIS — I509 Heart failure, unspecified: Secondary | ICD-10-CM

## 2012-04-24 DIAGNOSIS — R5383 Other fatigue: Secondary | ICD-10-CM | POA: Diagnosis present

## 2012-04-24 DIAGNOSIS — I059 Rheumatic mitral valve disease, unspecified: Secondary | ICD-10-CM

## 2012-04-24 LAB — BASIC METABOLIC PANEL
CO2: 36 mEq/L — ABNORMAL HIGH (ref 19–32)
Chloride: 97 mEq/L (ref 96–112)
GFR calc Af Amer: 85 mL/min — ABNORMAL LOW (ref >90)
Potassium: 4.3 mEq/L (ref 3.5–5.1)

## 2012-04-24 LAB — BLOOD GAS, ARTERIAL
Acid-Base Excess: 11.5 mmol/L — ABNORMAL HIGH (ref 0.0–2.0)
Bicarbonate: 37.4 mEq/L — ABNORMAL HIGH (ref 20.0–24.0)
Patient temperature: 98.6
TCO2: 39.5 mmol/L (ref 0–100)
pCO2 arterial: 68.7 mmHg (ref 35.0–45.0)

## 2012-04-24 LAB — CBC
Hemoglobin: 11.1 g/dL — ABNORMAL LOW (ref 13.0–17.0)
MCH: 25.3 pg — ABNORMAL LOW (ref 26.0–34.0)
RBC: 4.38 MIL/uL (ref 4.22–5.81)
WBC: 5.9 10*3/uL (ref 4.0–10.5)

## 2012-04-24 LAB — HEPARIN LEVEL (UNFRACTIONATED): Heparin Unfractionated: 0.29 IU/mL — ABNORMAL LOW (ref 0.30–0.70)

## 2012-04-24 LAB — CARDIAC PANEL(CRET KIN+CKTOT+MB+TROPI)
CK, MB: 3.4 ng/mL (ref 0.3–4.0)
Total CK: 30 U/L (ref 7–232)

## 2012-04-24 LAB — T4, FREE: Free T4: 0.85 ng/dL (ref 0.80–1.80)

## 2012-04-24 LAB — T3, FREE: T3, Free: 2.2 pg/mL — ABNORMAL LOW (ref 2.3–4.2)

## 2012-04-24 MED ORDER — HEPARIN BOLUS VIA INFUSION
3500.0000 [IU] | Freq: Once | INTRAVENOUS | Status: AC
  Administered 2012-04-24: 3500 [IU] via INTRAVENOUS
  Filled 2012-04-24: qty 3500

## 2012-04-24 MED ORDER — HEPARIN (PORCINE) IN NACL 100-0.45 UNIT/ML-% IJ SOLN
950.0000 [IU]/h | INTRAMUSCULAR | Status: DC
  Administered 2012-04-24: 850 [IU]/h via INTRAVENOUS
  Administered 2012-04-25 (×2): 950 [IU]/h via INTRAVENOUS
  Filled 2012-04-24 (×4): qty 250

## 2012-04-24 NOTE — Progress Notes (Signed)
ANTICOAGULATION CONSULT NOTE - Initial Consult  Pharmacy Consult for Heparin Indication: atrial fibrillation  Allergies  Allergen Reactions  . Morphine And Related Anaphylaxis    Cardiac arrest  . Other Other (See Comments)    ALL SEDATIVES;  Lethargic, can hardly function, etc.  . Lisinopril Cough  . Vioxx (Rofecoxib) Other (See Comments)    Lethargic, can hardly function, very hard to wake up    Patient Measurements: Height: 5\' 2"  (157.5 cm) Weight: 156 lb 15.5 oz (71.2 kg) IBW/kg (Calculated) : 54.6  Heparin Dosing Weight: 69 kg  Vital Signs: Temp: 97.8 F (36.6 C) (07/04 0750) Temp src: Oral (07/04 0750) BP: 112/60 mmHg (07/04 0750) Pulse Rate: 78  (07/04 0750)  Labs:  Basename 04/24/12 0435 04/23/12 2236 04/23/12 2124 04/23/12 1626  HGB -- -- 10.4* --  HCT -- -- 32.2* --  PLT -- -- 145* --  APTT -- -- -- 36  LABPROT -- -- 16.4* 16.4*  INR -- -- 1.30 1.30  HEPARINUNFRC -- -- -- --  CREATININE 0.98 -- -- 1.02  CKTOTAL 30 34 -- 44  CKMB 3.4 3.7 -- 3.8  TROPONINI <0.30 <0.30 -- <0.30    Estimated Creatinine Clearance: 48.6 ml/min (by C-G formula based on Cr of 0.98).   Medical History: Past Medical History  Diagnosis Date  . CAD (coronary artery disease)     Remote inferior MI in 1984, complicated by VFIB requiring multiple shocks, cardiogenic shock and CHF  . CHF (congestive heart failure)     Last echo in 2005 with EF 50 to 55%  . Arrhythmia     Hx of VFIB in 1984  . Abnormal nuclear cardiac imaging test     Lexiscan in Jan 2011 with EF 44% and large area of infarct involving the inferoseptal basal, inferior and inferolateral wall.   . Iron deficiency anemia   . HTN (hypertension)   . Bladder cancer   . Hip fracture   . Chronic back pain   . Right bundle branch block     Chronic right bundle-branch block.   . History of tobacco abuse   . Cellulitis      Chronic cellulitis right ankle.  . Anemia   . CAD (coronary artery disease)     With remote  large inferior MI dating back to 1984 with associated VFib arrest. Was not cathed.   . Dyslipidemia   . Nonhealing nonsurgical wound      Infected, chronic nonhealing wound, right ankle  . Postoperative ileus   . Hypercholesterolemia   . Hyponatremia   . Inguinal hernia     Strangulated recurrent right inguinal hernia  . Depression   . Osteoarthritis     severe  . Chronic constipation   . Palpitations   . Systolic heart failure     EF is 25% per echo in October 2012 with global hypokinesis and posterior and inferior akinesis, as well as grade 2 diastolic dysfunction, moderate TR and mild pulmonary HTN    Medications:  Prescriptions prior to admission  Medication Sig Dispense Refill  . acetaminophen (TYLENOL) 325 MG tablet Take 650 mg by mouth every 6 (six) hours as needed. For pain      . aspirin 325 MG EC tablet Take 325 mg by mouth daily.       [name] atorvastatin (LIPITOR) 20 MG tablet Take 20 mg by mouth daily.      . calcium carbonate (OS-CAL) 600 MG TABS Take 600 mg by mouth 2 (  two) times daily with a meal.        . carvedilol (COREG) 6.25 MG tablet Take 1 tablet (6.25 mg total) by mouth 2 (two) times daily with a meal.  180 tablet  3  . chlordiazePOXIDE-amitriptyline (LIMBITROL DS) 10-25 MG TABS Take 1 tablet by mouth at bedtime.      . clopidogrel (PLAVIX) 75 MG tablet Take 1 tablet (75 mg total) by mouth daily.  90 tablet  3  . ferrous gluconate (FERGON) 325 MG tablet Take 325 mg by mouth daily with breakfast.      . furosemide (LASIX) 40 MG tablet Take 1 tablet (40 mg total) by mouth 2 (two) times daily.  180 tablet  3  . Multiple Vitamin (MULTIVITAMIN WITH MINERALS) TABS Take 1 tablet by mouth daily.      . nitroGLYCERIN (NITRODUR - DOSED IN MG/24 HR) 0.4 mg/hr Place 1 patch (0.4 mg total) onto the skin daily.  90 patch  3  . nitroGLYCERIN (NITROSTAT) 0.4 MG SL tablet Place 0.4 mg under the tongue every 5 (five) minutes as needed. For chest pain      . potassium chloride SA  (K-DUR,KLOR-CON) 20 MEQ tablet Take 1 tablet (20 mEq total) by mouth 2 (two) times daily.  180 tablet  3  . spironolactone (ALDACTONE) 25 MG tablet Take 12.5 mg by mouth daily.      [name] tiotropium (SPIRIVA) 18 MCG inhalation capsule Place 1 capsule (18 mcg total) into inhaler and inhale daily.  90 capsule  3  . DISCONTD: atorvastatin (LIPITOR) 20 MG tablet Take 1 tablet (20 mg total) by mouth daily.  90 tablet  3  . DISCONTD: spironolactone (ALDACTONE) 25 MG tablet Take 0.5 tablets (12.5 mg total) by mouth as directed.  90 tablet  3    Assessment: 78 y.o. male presents with new afib. Head CT negative for bleed. To begin IV heparin.  Goal of Therapy:  Heparin level 0.3-0.7 units/ml Monitor platelets by anticoagulation protocol: Yes   Plan:  1. Heparin IV bolus 3500 units 2. Heparin gtt at 850 units/hr 3. Heparin level in 8 hours 4. Daily heparin level and CBC  Christoper Fabian, PharmD, BCPS Clinical pharmacist, pager [phone] 04/24/2012,8:01 AM

## 2012-04-24 NOTE — Progress Notes (Signed)
ANTICOAGULATION CONSULT NOTE - Follow Up Consult  Pharmacy Consult for Heparin Indication: atrial fibrillation  Allergies  Allergen Reactions  . Morphine And Related Anaphylaxis    Cardiac arrest  . Other Other (See Comments)    ALL SEDATIVES;  Lethargic, can hardly function, etc.  . Lisinopril Cough  . Vioxx (Rofecoxib) Other (See Comments)    Lethargic, can hardly function, very hard to wake up    Patient Measurements: Height: 5\' 2"  (157.5 cm) Weight: 156 lb 15.5 oz (71.2 kg) IBW/kg (Calculated) : 54.6  Heparin Dosing Weight: 69kg  Vital Signs: Temp: 98.3 F (36.8 C) (07/04 1600) Temp src: Oral (07/04 1600) BP: 101/56 mmHg (07/04 1600) Pulse Rate: 75  (07/04 1600)  Labs:  Basename 04/24/12 1750 04/24/12 1035 04/24/12 0435 04/23/12 2236 04/23/12 2124 04/23/12 1626  HGB -- 11.1* -- -- 10.4* --  HCT -- 35.3* -- -- 32.2* --  PLT -- 135* -- -- 145* --  APTT -- -- -- -- -- 36  LABPROT -- -- -- -- 16.4* 16.4*  INR -- -- -- -- 1.30 1.30  HEPARINUNFRC 0.29* -- -- -- -- --  CREATININE -- -- 0.98 -- -- 1.02  CKTOTAL -- -- 30 34 -- 44  CKMB -- -- 3.4 3.7 -- 3.8  TROPONINI -- -- <0.30 <0.30 -- <0.30    Estimated Creatinine Clearance: 48.6 ml/min (by C-G formula based on Cr of 0.98).   Medications:  Heparin 850 units/hr   Assessment: 86yom on heparin for new onset Afib. Heparin level (0.29) is just below goal level. No problems with IV line/infusion per RN.  - Hg improved, Plts slight decline - No significant bleeding reported  Goal of Therapy:  Heparin level 0.3-0.7 units/ml Monitor platelets by anticoagulation protocol: Yes   Plan:  1. Increase heparin drip to 950 units/hr (9.5 ml/hr) 2. Follow-up AM HL  Cleon Dew [phone] 04/24/2012,6:40 PM

## 2012-04-24 NOTE — Progress Notes (Signed)
  Echocardiogram 2D Echocardiogram has been performed.  Trevor [name] FRANCES 04/24/2012, 12:25 PM

## 2012-04-24 NOTE — Progress Notes (Signed)
Primary cardiologist: Dr. Patty Sermons  Patient Name: Mickie Hillier Lege Date of Encounter: 04/24/2012   Principal Problem:  *Lethargy Active Problems:  Atrial fibrillation  Ischemic heart disease  Congestive heart failure with left ventricular systolic dysfunction  COPD (chronic obstructive pulmonary disease)   SUBJECTIVE: Pt sleepy, but awakens to voice and responds to questions. Per nsg, pt desats at times. Has upper airway congestion.  OBJECTIVE Filed Vitals:   04/23/12 2100 04/23/12 2152 04/23/12 2300 04/24/12 0400  BP: 109/55 105/46 113/57 89/51  Pulse: 68 72 72 63  Temp:   97.3 F (36.3 C) 97.9 F (36.6 C)  TempSrc:   Oral Oral  Resp: 17     Height:  5\' 2"  (1.575 m)    Weight:  156 lb 15.5 oz (71.2 kg)    SpO2: 95%  97% 89%    Intake/Output Summary (Last 24 hours) at 04/24/12 0721 Last data filed at 04/24/12 0500  Gross per 24 hour  Intake    243 ml  Output    600 ml  Net   -357 ml   Weight change:  Filed Weights   04/23/12 2152  Weight: 156 lb 15.5 oz (71.2 kg)     PHYSICAL EXAM General: Well developed, well nourished, male in no acute distress. Head: Normocephalic, atraumatic.  Neck: Supple without bruits, JVD at about 10 cm. Lungs:  Resp regular and unlabored, bibasilar rales with some crackles. Heart: slightly irregular, S1, S2, no S3, S4, or murmur. Abdomen: Soft, non-tender, non-distended, BS + x 4.  Extremities: No clubbing, cyanosis, no edema.  Neuro: Alert and oriented X 3 (when wakened). Moves all extremities spontaneously. Sleeps unless roused.   LABS: CBC: Basename 04/23/12 2124  WBC 6.7  NEUTROABS 4.0  HGB 10.4*  HCT 32.2*  MCV 77.8*  PLT 145*   INR: Basename 04/23/12 2124  INR 1.30   Basic Metabolic Panel: Basename 04/24/12 0435 04/23/12 1626  NA 140 140  K 4.3 4.6  CL 97 98  CO2 36* 34*  GLUCOSE 120* 108*  BUN 18 16  CREATININE 0.98 1.02  CALCIUM 9.3 9.2  MG -- 2.5  PHOS -- --   Liver Function Tests: Basename  04/23/12 1626  AST 61*  ALT 34  ALKPHOS 129*  BILITOT 1.1  PROT 7.8  ALBUMIN 2.6*   Cardiac Enzymes: Basename 04/24/12 0435 04/23/12 2236 04/23/12 1626  CKTOTAL 30 34 44  CKMB 3.4 3.7 3.8  CKMBINDEX -- -- --  TROPONINI <0.30 <0.30 <0.30    BNP: Pro B Natriuretic peptide (BNP)  Date/Time Value Range Status  04/23/2012  4:26 PM 4908.0* 0 - 450 pg/mL Final  08/10/2011  5:30 AM 3751.0* 0 - 450 pg/mL Final   Thyroid Function Tests: Basename 04/23/12 1626  TSH 3.904  T4TOTAL --  T3FREE --  THYROIDAB --    TELE:  Atrial fib with slow VR at times, drops briefly into the 30s   Radiology/Studies: Dg Chest 2 View 04/24/2012  *RADIOLOGY REPORT*  Clinical Data: Weakness, shortness of breath, CHF.  CHEST - 2 VIEW  Comparison: 08/10/2011  Findings: Cardiomegaly with vascular congestion.  Low lung volumes with bibasilar atelectasis and small effusions.  No overt edema. No acute bony abnormality.  IMPRESSION: Cardiomegaly, vascular congestion.  Low lung volumes, bibasilar atelectasis.  Small effusions.  Original Report Authenticated By: Cyndie Chime, M.D.   Ct Head Wo Contrast 04/23/2012  *RADIOLOGY REPORT*  Clinical Data: Lethargic.  Questionable head trauma.  Rule out bleed.  CT HEAD  WITHOUT CONTRAST  Technique:  Contiguous axial images were obtained from the base of the skull through the vertex without contrast.  Comparison: None.  Findings: Motion degraded exam.  No intracranial hemorrhage.  No CT evidence of large acute infarct.  Small vessel disease type changes.  Global atrophy without hydrocephalus.  Vascular calcifications.  No intracranial mass lesion detected on this unenhanced exam.  Minimal mucosal thickening maxillary sinuses.  IMPRESSION: No intracranial hemorrhage.  Please see above.  Original Report Authenticated By: Fuller Canada, M.D.    Current Medications:     . a stronger pump book   Does not apply Once  . antiseptic oral rinse  15 mL Mouth Rinse BID  . aspirin EC  325  mg Oral Daily  . atorvastatin  20 mg Oral q1800  . calcium-vitamin D  1 tablet Oral BID  . carvedilol  6.25 mg Oral BID WC  . clopidogrel  75 mg Oral Q breakfast  . feeding supplement  237 mL Oral BID BM  . ferrous sulfate  325 mg Oral Q breakfast  . furosemide  40 mg Oral BID  . levothyroxine  25 mcg Oral QAC breakfast  . potassium chloride  20 mEq Oral Daily  . sodium chloride  3 mL Intravenous Q12H  . spironolactone  12.5 mg Oral Daily  . tiotropium  18 mcg Inhalation Q2000      ASSESSMENT AND PLAN: Principal Problem:  *Lethargy - no sedating meds, CO2 is elevated (has been in the past, but not this high except when last in the hosp for diuresis 10/12). Synthroid started empirically for Hx elevated TSH, will ck free T3/4 but TSH normal now. Check ABG on O2 and on room air. Low sats not associated with lethargy. CT was negative for bleed but showed atrophy. MD advise on further eval.  Active Problems:  Atrial fibrillation - Slow at times (?when asleep) but no clear association, rate OK   Ischemic heart disease - no ischemic Sx and ez neg.   Congestive heart failure with left ventricular systolic dysfunction - EF 50-55 by echo 2005, repeat ordered, BNP up, MD advise on IV Lasix x 1 dose if BP improves.   COPD (chronic obstructive pulmonary disease) - no on home O2  Anticoagulation - since head CT negative for bleed, add heparin per TB note.   Signed, Theodore Demark , PA-C 7:21 AM 04/24/2012   Attending note:  Patient seen and examined. Reviewed recent records including Dr. Yevonne Pax office note from yesterday. He remains somnolent, but does speak with me on rounds this morning, states he feels short of breath, otherwise no chest pain or palpitations. Remains in rate controlled atrial fibrillation with some intermittent bradycardia.  On examination he is afebrile, systolic blood pressure ranging 89-113, heart rate generally in the 60s to 70s. Lungs exhibit diminished coarse  breath sounds, cardiac exam is distant irregular heart sounds. No abdominal discomfort to palpation.  ABG obtained this morning shows pH 7.35, PCO2 68, PO2 72, bicarbonate 37. Potassium 4.3, creatinine 0.9, troponin I levels negative x3, pro BNP 4908, hemoglobin 10.4, platelets 145, TSH 3.9 with free T4 0.85. Chest x-ray demonstrates cardiomegaly with no congestion, low lung volumes with bibasilar atelectasis and small effusions. Head CT showed no acute bleed, no obvious evidence of large infarct, small vessel changes with global atrophy, no hydrocephalus.  Patient has evidence of hypercarbic respiratory failure, possibly explaining somnolence. History of COPD and Dr. Yevonne Pax note  Respiratory to assess for BiPAP treatment.  Continue medical therapy for CAD and cardiomyopathy with addition of heparin for atrial fibrillation as per Dr. Yevonne Pax plan. Continue Synthroid for now, although acute hypothyroid state not seem to be the explanation for his presentation. Will also add ammonia level. May need pulmonary consult.  Jonelle Sidle, M.D., F.A.C.C.

## 2012-04-24 NOTE — Progress Notes (Signed)
UR Completed.  [facility] [place] [place] [phone] 04/24/2012  

## 2012-04-25 ENCOUNTER — Inpatient Hospital Stay (HOSPITAL_COMMUNITY): Payer: Medicare Other

## 2012-04-25 LAB — AMMONIA: Ammonia: 53 umol/L (ref 11–60)

## 2012-04-25 LAB — BASIC METABOLIC PANEL
CO2: 36 mEq/L — ABNORMAL HIGH (ref 19–32)
CO2: 37 mEq/L — ABNORMAL HIGH (ref 19–32)
Chloride: 96 mEq/L (ref 96–112)
Chloride: 94 mEq/L — ABNORMAL LOW (ref 96–112)
Creatinine, Ser: 0.97 mg/dL (ref 0.50–1.35)
GFR calc Af Amer: 85 mL/min — ABNORMAL LOW (ref >90)
Glucose, Bld: 124 mg/dL — ABNORMAL HIGH (ref 70–99)
Potassium: 4.5 mEq/L (ref 3.5–5.1)
Potassium: 4.5 mEq/L (ref 3.5–5.1)
Sodium: 138 mEq/L (ref 135–145)

## 2012-04-25 LAB — CARBOXYHEMOGLOBIN: Total hemoglobin: 9.7 g/dL — ABNORMAL LOW (ref 13.5–18.0)

## 2012-04-25 LAB — COMPREHENSIVE METABOLIC PANEL
BUN: 25 mg/dL — ABNORMAL HIGH (ref 6–23)
CO2: 35 mEq/L — ABNORMAL HIGH (ref 19–32)
Chloride: 94 mEq/L — ABNORMAL LOW (ref 96–112)
Creatinine, Ser: 1.18 mg/dL (ref 0.50–1.35)
GFR calc non Af Amer: 55 mL/min — ABNORMAL LOW (ref >90)
Glucose, Bld: 152 mg/dL — ABNORMAL HIGH (ref 70–99)
Total Bilirubin: 1.6 mg/dL — ABNORMAL HIGH (ref 0.3–1.2)

## 2012-04-25 LAB — CBC
HCT: 34.5 % — ABNORMAL LOW (ref 39.0–52.0)
Hemoglobin: 11.2 g/dL — ABNORMAL LOW (ref 13.0–17.0)
Hemoglobin: 11.4 g/dL — ABNORMAL LOW (ref 13.0–17.0)
MCH: 25.4 pg — ABNORMAL LOW (ref 26.0–34.0)
MCHC: 32.5 g/dL (ref 30.0–36.0)
MCHC: 32.4 g/dL (ref 30.0–36.0)
RBC: 4.36 MIL/uL (ref 4.22–5.81)
WBC: 8 10*3/uL (ref 4.0–10.5)

## 2012-04-25 LAB — MAGNESIUM: Magnesium: 1.8 mg/dL (ref 1.5–2.5)

## 2012-04-25 LAB — PRO B NATRIURETIC PEPTIDE: Pro B Natriuretic peptide (BNP): 14991 pg/mL — ABNORMAL HIGH (ref 0–450)

## 2012-04-25 LAB — POCT I-STAT 3, ART BLOOD GAS (G3+)
Bicarbonate: 41.6 mEq/L — ABNORMAL HIGH (ref 20.0–24.0)
Patient temperature: 98.5
TCO2: 43 mmol/L (ref 0–100)
pCO2 arterial: 45.2 mmHg — ABNORMAL HIGH (ref 35.0–45.0)
pH, Arterial: 7.571 — ABNORMAL HIGH (ref 7.350–7.450)
pO2, Arterial: 119 mmHg — ABNORMAL HIGH (ref 80.0–100.0)

## 2012-04-25 LAB — HEPARIN LEVEL (UNFRACTIONATED): Heparin Unfractionated: 0.58 IU/mL (ref 0.30–0.70)

## 2012-04-25 LAB — CARDIAC PANEL(CRET KIN+CKTOT+MB+TROPI): Relative Index: INVALID (ref 0.0–2.5)

## 2012-04-25 LAB — PHOSPHORUS: Phosphorus: 3.7 mg/dL (ref 2.3–4.6)

## 2012-04-25 MED ORDER — FENTANYL CITRATE 0.05 MG/ML IJ SOLN: 25.0000 ug | INTRAMUSCULAR | Status: DC | PRN

## 2012-04-25 MED ORDER — LOSARTAN POTASSIUM 25 MG PO TABS
12.5000 mg | ORAL_TABLET | Freq: Two times a day (BID) | ORAL | Status: DC
  Administered 2012-04-25: 12.5 mg via ORAL
  Filled 2012-04-25 (×2): qty 0.5

## 2012-04-25 MED ORDER — SODIUM CHLORIDE 0.9 % IV SOLN
250.0000 mL | INTRAVENOUS | Status: DC | PRN
  Administered 2012-05-06: 18:00:00 via INTRAVENOUS

## 2012-04-25 MED ORDER — DOPAMINE-DEXTROSE 3.2-5 MG/ML-% IV SOLN
2.0000 ug/kg/min | INTRAVENOUS | Status: DC
  Administered 2012-04-25: 10 ug/kg/min via INTRAVENOUS
  Administered 2012-04-26: 14 ug/kg/min via INTRAVENOUS
  Administered 2012-04-26: 14.996 ug/kg/min via INTRAVENOUS
  Administered 2012-04-27: 9 ug/kg/min via INTRAVENOUS
  Administered 2012-04-28: 8 ug/kg/min via INTRAVENOUS
  Administered 2012-04-29: 7 ug/kg/min via INTRAVENOUS
  Filled 2012-04-25 (×6): qty 250

## 2012-04-25 MED ORDER — MIDAZOLAM HCL 2 MG/2ML IJ SOLN: 1.0000 mg | INTRAMUSCULAR | Status: DC | PRN

## 2012-04-25 MED ORDER — LISINOPRIL 2.5 MG PO TABS: 2.5000 mg | ORAL_TABLET | Freq: Two times a day (BID) | ORAL | Status: DC

## 2012-04-25 MED ORDER — SODIUM CHLORIDE 0.9 % IV SOLN
Freq: Once | INTRAVENOUS | Status: AC
  Administered 2012-04-25: 700 mL/h via INTRAVENOUS

## 2012-04-25 MED ORDER — PANTOPRAZOLE SODIUM 40 MG IV SOLR
40.0000 mg | Freq: Every day | INTRAVENOUS | Status: DC
  Administered 2012-04-26 – 2012-04-27 (×2): 40 mg via INTRAVENOUS
  Filled 2012-04-25 (×3): qty 40

## 2012-04-25 MED ORDER — FUROSEMIDE 10 MG/ML IJ SOLN
40.0000 mg | Freq: Once | INTRAMUSCULAR | Status: AC
  Administered 2012-04-25: 40 mg via INTRAVENOUS
  Filled 2012-04-25: qty 4

## 2012-04-25 MED ORDER — PIPERACILLIN-TAZOBACTAM 3.375 G IVPB
3.3750 g | Freq: Three times a day (TID) | INTRAVENOUS | Status: DC
  Administered 2012-04-26 – 2012-04-29 (×11): 3.375 g via INTRAVENOUS
  Filled 2012-04-25 (×13): qty 50

## 2012-04-25 MED ORDER — NOREPINEPHRINE BITARTRATE 1 MG/ML IJ SOLN
2.0000 ug/min | INTRAVENOUS | Status: DC
  Administered 2012-04-25: 5 ug/min via INTRAVENOUS
  Filled 2012-04-25: qty 16

## 2012-04-25 MED ORDER — FUROSEMIDE 10 MG/ML IJ SOLN
80.0000 mg | Freq: Three times a day (TID) | INTRAMUSCULAR | Status: DC
  Filled 2012-04-25: qty 8

## 2012-04-25 MED ORDER — FUROSEMIDE 10 MG/ML IJ SOLN
80.0000 mg | Freq: Three times a day (TID) | INTRAMUSCULAR | Status: DC
  Filled 2012-04-25 (×2): qty 8

## 2012-04-25 MED ORDER — IPRATROPIUM-ALBUTEROL 18-103 MCG/ACT IN AERO
4.0000 | INHALATION_SPRAY | RESPIRATORY_TRACT | Status: DC
  Administered 2012-04-26 (×2): 4 via RESPIRATORY_TRACT
  Filled 2012-04-25: qty 14.7

## 2012-04-25 MED ORDER — ASPIRIN EC 81 MG PO TBEC
81.0000 mg | DELAYED_RELEASE_TABLET | Freq: Every day | ORAL | Status: DC
  Administered 2012-04-25 – 2012-05-05 (×10): 81 mg via ORAL
  Filled 2012-04-25 (×12): qty 1

## 2012-04-25 MED ORDER — MIDAZOLAM HCL 2 MG/2ML IJ SOLN
1.0000 mg | INTRAMUSCULAR | Status: DC | PRN
  Administered 2012-04-26 (×2): 2 mg via INTRAVENOUS
  Administered 2012-04-27: 1 mg via INTRAVENOUS
  Filled 2012-04-25 (×4): qty 2

## 2012-04-25 MED ORDER — FUROSEMIDE 10 MG/ML IJ SOLN
40.0000 mg | Freq: Three times a day (TID) | INTRAMUSCULAR | Status: DC
  Administered 2012-04-25 (×2): 40 mg via INTRAVENOUS
  Filled 2012-04-25 (×4): qty 4

## 2012-04-25 NOTE — Code Documentation (Signed)
CODE BLUE NOTE  Patient Name:  Trevor [name]   MRN:  [id number]   Date of Birth/ Sex:  03/06/1925 , male      Admission Date:  04/23/2012  Attending Provider:  Dr. Patty Sermons  Primary Diagnosis:  Lethargy   Indication:  Pt was in his usual state of health until this PM, when he was noted to be in respiratory distress. Code blue was subsequently called. At the time of arrival on scene, pt was being bagged and did have pulse and rhythm. He did have respiratory distress which led to a fib and v tach. CPR initiated and normal rhythm resumed and bagged until intubation.  Technical Description:  - CPR performance duration:  1 minute   - Was defibrillation or cardioversion used?  No   - Was external pacer placed?  No   - Was patient intubated pre/post CPR?  Yes   Medications Administered: Y = Yes; Blank = No  Amiodarone    Atropine    Calcium    Epinephrine    Lidocaine    Magnesium    Norepinephrine    Phenylephrine    Sodium bicarbonate    Vasopressin    Post CPR evaluation:  - Final Status - Was patient successfully resuscitated ? Yes  - What is current rhythm? sinus  - What is current hemodynamic status? Stable, intubated  Miscellaneous Information:  - Labs sent, including:  i-stat   - Primary team notified?  Paged, awaiting return call   - Family Notified?  No   - Additional notes/ transfer status: To ICU  Judie Bonus, MD  04/25/2012, 8:24 PM

## 2012-04-25 NOTE — Plan of Care (Signed)
Cardiology Note  Trevor [name] had code blue secondary to respiratory distress.  CPR was initiated and patient was successfully intubated and transferred to ICU for further management.  Most recent gas suggestive hypercarbic respiratory failure.  He is also now hypotensive after fluid bolus.  Central line has been placed by critical care.    Exam BP 88/29  Pulse 79  Temp 98.6 F (37 C) (Axillary)  Resp 16  Ht 5\' 2"  (1.575 m)  Wt 75.4 kg (166 lb 3.6 oz)  BMI 30.40 kg/m2  SpO2 100% Intubated Coarse breath sounds over vent irregular no murmurs 1+ radial pulses  A/P Acute respiratory failure, like multifactorial given below, Acute PE of concern, patient currently on Heparin for Afib. Acute on chronic systolic CHF Afib COPD  --Discussed plan with family and critical care team, appreciate their assistance with management. --Will check CVP, repeat ABG --Con't Mechanical ventilation, pulmonary toilet --Add inotropes/vasopressors for hypotension --Con't IV Heparin --Goals of care discussed with family, if any further events, patient is now DNR.  This has been updated.

## 2012-04-25 NOTE — Progress Notes (Signed)
Dr. Shirlee Latch in to see. Wife present at bedside. 02 sat 88% per venti mask.

## 2012-04-25 NOTE — Progress Notes (Signed)
11:00 PM  Zosyn 3.375 gm standard q8h (4hour infusion) appropriate for current renal fxn  .[name]

## 2012-04-25 NOTE — Progress Notes (Signed)
   Trevor [name] has not diuresed particularly well today.  Oxygen saturation 88-90% on room air.  He is oriented but still somewhat drowsy. He is still very volume overloaded.  - ABG - Would put back on Bipap - Increase Lasix to 80 mg IV every 8 hrs.   - CXR in am.   [name] 04/25/2012 6:42 PM

## 2012-04-25 NOTE — Progress Notes (Signed)
Pt with increased crackles in lungs. resp rate 40. o2 sat 84% on 4 liters n/c. Theodore Demark, PA  Notified. Instructed to place pt on venti mask at 35%. Pt known copd. Give next dose of Lasix 40mg  now.

## 2012-04-25 NOTE — Progress Notes (Signed)
CRITICAL VALUE ALERT  Critical value received:  ABGS: CO2: 79 po2:53 pH: 7.34 HCO3:42   Date of notification:  T  Time of notification:  1940  Critical value read back:yes  Nurse who received alert:  Fransisco Hertz, RN  MD notified (1st page):  Cardiology  Time of first page:  1945  MD notified (2nd page):  Time of second page:  Responding MD:  Bjorn Loser, [place]  Time MD responded:  49  Spoke with [place], [place] about pt LOC, o2 sats, and orders. Informed that PA would notify MD.  RT in room placing pt on bipap

## 2012-04-25 NOTE — Progress Notes (Signed)
Spoke with daughter, she had several concerns. Tried to describe the testing/results which has been done so far and the plan of care. Advised her that one of our concerns is respiratory but the Lasix should help some. His lungs are not very efficient normally because of the COPD but pulling off fluid may help. She described a dramatic improvement in his mental status/alertness yesterday pm, vastly different from today. Advised her we do not have all the answers but will keep working with him and we are doing everything we can. She was reassured that we are still trying hard to help him.

## 2012-04-25 NOTE — Progress Notes (Signed)
Patient ID: Trevor [name], male   DOB: 02-Oct-1925, 78 y.o.   MRN: [id number]    SUBJECTIVE: Patient is awake this am but a bit drowsy.  He is oriented and answers questions.  He is on facemask this morning and per nurse sounded "congested" through the night.  EF 25-30% on echo.      [name] a stronger pump book   Does not apply Once  . antiseptic oral rinse  15 mL Mouth Rinse BID  . aspirin EC  81 mg Oral Daily  . atorvastatin  20 mg Oral q1800  . calcium-vitamin D  1 tablet Oral BID  . carvedilol  6.25 mg Oral BID WC  . clopidogrel  75 mg Oral Q breakfast  . feeding supplement  237 mL Oral BID BM  . ferrous sulfate  325 mg Oral Q breakfast  . furosemide  40 mg Intravenous Q8H  . heparin  3,500 Units Intravenous Once  . levothyroxine  25 mcg Oral QAC breakfast  . lisinopril  2.5 mg Oral BID  . potassium chloride  20 mEq Oral Daily  . sodium chloride  3 mL Intravenous Q12H  . spironolactone  12.5 mg Oral Daily  . tiotropium  18 mcg Inhalation Q2000  . DISCONTD: aspirin EC  325 mg Oral Daily  . DISCONTD: furosemide  40 mg Oral BID      Filed Vitals:   04/24/12 1600 04/24/12 2045 04/25/12 0116 04/25/12 0459  BP: 101/56 121/67 107/58 113/63  Pulse: 75 75 81 88  Temp: 98.3 F (36.8 C) 98 F (36.7 C) 97.6 F (36.4 C) 98.6 F (37 C)  TempSrc: Oral Oral Oral Axillary  Resp: 34 36 31 31  Height:      Weight:   75.4 kg (166 lb 3.6 oz)   SpO2: 95% 99% 95% 96%    Intake/Output Summary (Last 24 hours) at 04/25/12 0832 Last data filed at 04/25/12 0600  Gross per 24 hour  Intake    773 ml  Output   1250 ml  Net   -477 ml    LABS: Basic Metabolic Panel:  Basename 04/25/12 0415 04/24/12 0435 04/23/12 1626  NA 140 140 --  K 4.5 4.3 --  CL 96 97 --  CO2 36* 36* --  GLUCOSE 157* 120* --  BUN 18 18 --  CREATININE 0.97 0.98 --  CALCIUM 9.1 9.3 --  MG -- -- 2.5  PHOS -- -- --   Liver Function Tests:  Basename 04/23/12 1626  AST 61*  ALT 34  ALKPHOS 129*  BILITOT 1.1    PROT 7.8  ALBUMIN 2.6*   No results found for this basename: LIPASE:2,AMYLASE:2 in the last 72 hours CBC:  Basename 04/25/12 0415 04/24/12 1035 04/23/12 2124  WBC 8.0 5.9 --  NEUTROABS -- -- 4.0  HGB 11.2* 11.1* --  HCT 34.5* 35.3* --  MCV 79.1 80.6 --  PLT 307 135* --   Cardiac Enzymes:  Basename 04/24/12 0435 04/23/12 2236 04/23/12 1626  CKTOTAL 30 34 44  CKMB 3.4 3.7 3.8  CKMBINDEX -- -- --  TROPONINI <0.30 <0.30 <0.30   BNP: No components found with this basename: POCBNP:3 D-Dimer: No results found for this basename: DDIMER:2 in the last 72 hours Hemoglobin A1C: No results found for this basename: HGBA1C in the last 72 hours Fasting Lipid Panel: No results found for this basename: CHOL,HDL,LDLCALC,TRIG,CHOLHDL,LDLDIRECT in the last 72 hours Thyroid Function Tests:  Basename 04/24/12 0926 04/23/12 1626  TSH -- 3.904  T4TOTAL -- --  T3FREE 2.2* --  THYROIDAB -- --   Anemia Panel: No results found for this basename: VITAMINB12,FOLATE,FERRITIN,TIBC,IRON,RETICCTPCT in the last 72 hours  RADIOLOGY: Dg Chest 2 View  04/24/2012  *RADIOLOGY REPORT*  Clinical Data: Weakness, shortness of breath, CHF.  CHEST - 2 VIEW  Comparison: 08/10/2011  Findings: Cardiomegaly with vascular congestion.  Low lung volumes with bibasilar atelectasis and small effusions.  No overt edema. No acute bony abnormality.  IMPRESSION: Cardiomegaly, vascular congestion.  Low lung volumes, bibasilar atelectasis.  Small effusions.  Original Report Authenticated By: Cyndie Chime, M.D.   Ct Head Wo Contrast  04/23/2012  *RADIOLOGY REPORT*  Clinical Data: Lethargic.  Questionable head trauma.  Rule out bleed.  CT HEAD WITHOUT CONTRAST  Technique:  Contiguous axial images were obtained from the base of the skull through the vertex without contrast.  Comparison: None.  Findings: Motion degraded exam.  No intracranial hemorrhage.  No CT evidence of large acute infarct.  Small vessel disease type changes.   Global atrophy without hydrocephalus.  Vascular calcifications.  No intracranial mass lesion detected on this unenhanced exam.  Minimal mucosal thickening maxillary sinuses.  IMPRESSION: No intracranial hemorrhage.  Please see above.  Original Report Authenticated By: Fuller Canada, M.D.    PHYSICAL EXAM General: NAD Neck: JVP 14 cm, no thyromegaly or thyroid nodule.  Lungs: Dependent crackles CV: Nondisplaced PMI.  Heart regular S1/S2, no S3/S4, 2/6 HSM LLSB.  Trace ankle edema.  No carotid bruit.  Normal pedal pulses.  Abdomen: Soft, nontender, no hepatosplenomegaly, no distention.  Neurologic: Alert and oriented, mildly drowsy.  Psych: Normal affect. Extremities: No clubbing or cyanosis.   TELEMETRY: Reviewed telemetry pt in regular rhythm in 90s, does not appear to be sinus.   ASSESSMENT AND PLAN:  78 yo with history of CAD and COPD presented with "lethargy" initially.  He was noted to be in new-onset atrial fibrillation without RVR.  Echo showed EF 25-30%.  1. CHF: Acute on chronic systolic CHF.  He is volume overloaded on exam.  He needs considerable diuresis I think.  EF 25-30% on echo with inferior and inferolateral thinning and akinesis.  This is probably chronic given inferior large scar on prior myoview.  - Change Lasix to 40 mg IV every 8 hours. - Continue current Coreg and spironolactone.  - Add low dose lisinopril 2.5 mg bid.  - BMET in pm with diuresis.  2. Atrial fibrillation: New onset per Dr. Patty Sermons.  He has been started on IV heparin given new onset but thought is that he would be poor long-term coumadin candidate given history of falls.  Today, he is in a regular rhythm with rate in the 90s.  It does not appear to be NSR.  I will get an ECG.  3. Pulmonary: Hypercarbic on ABG, crackles on lungs.  Suspect pulmonary edema component with underlying history of COPD.  Needs more aggressive diuresis. Continue Spiriva.  4. Mental status: He is oriented and appropriate this  am.  Will see if clearing his lungs of fluid helps.   [name] 04/25/2012 8:38 AM

## 2012-04-25 NOTE — Progress Notes (Signed)
ANTICOAGULATION CONSULT NOTE - Follow Up Consult  Pharmacy Consult for heparin Indication: atrial fibrillation  Labs:  Basename 04/25/12 0415 04/24/12 1750 04/24/12 1035 04/24/12 0435 04/23/12 2236 04/23/12 2124 04/23/12 1626  HGB 11.2* -- 11.1* -- -- -- --  HCT 34.5* -- 35.3* -- -- 32.2* --  PLT 307 -- 135* -- -- 145* --  APTT -- -- -- -- -- -- 36  LABPROT -- -- -- -- -- 16.4* 16.4*  INR -- -- -- -- -- 1.30 1.30  HEPARINUNFRC 0.58 0.29* -- -- -- -- --  CREATININE -- -- -- 0.98 -- -- 1.02  CKTOTAL -- -- -- 30 34 -- 44  CKMB -- -- -- 3.4 3.7 -- 3.8  TROPONINI -- -- -- <0.30 <0.30 -- <0.30    Assessment/Plan: 78yo male now therapeutic on heparin for Afib after rate increase.  Will continue gtt at current rate and monitor.  Colleen Can PharmD BCPS 04/25/2012,4:49 AM

## 2012-04-25 NOTE — Code Documentation (Addendum)
Pt placed on bipap. Noted decrease in RR and HR decreasing to 30bpm afib. RT bagging Pt. Weak, thready pulse noted. Code blue code at 2006, pulseless-asystole, CPR initiated. Pt begin to open eyes, noted Strong femoral pulse  with stable rhythmn. Code blue team arrived. Pt intubated. 104/48 BP. Bjorn Loser, PA updated and stated she will send a fellow over.  Report given to Deriana Vanderhoef December, RN on 2900. Pt transferred to 2903. Dentures and black cane placed in 2903.   0040: Glasses given to nurse [place], RN sitting beside room 2903

## 2012-04-25 NOTE — Procedures (Signed)
Intubation Procedure Note Trevor [name] [id number] 04-27-1925  Procedure: Intubation Indications: Airway protection and maintenance  Procedure Details Consent: Unable to obtain consent because of emergent medical necessity. Time Out: Verified patient identification, verified procedure, site/side was marked, verified correct patient position, special equipment/implants available, medications/allergies/relevent history reviewed, required imaging and test results available.  Performed  Maximum sterile technique was used including gloves and hand hygiene.  Miller and 3    Evaluation Hemodynamic Status: Transient hypotension  ; O2 sats: stable throughout Patient's Current Condition: stable Complications: No apparent complications Patient did tolerate procedure well. Chest X-ray ordered to verify placement.  CXR: tube position low-repostitioned.   Trevor [name], Trevor [name] 04/25/2012

## 2012-04-25 NOTE — Procedures (Signed)
Arterial Catheter Insertion Procedure Note Trevor [name] [id number] 10/17/1925  Procedure: Insertion of Arterial Catheter  Indications: Blood pressure monitoring and Frequent blood sampling  Procedure Details Consent: Unable to obtain consent because of emergent medical necessity. Time Out: Verified patient identification, verified procedure, site/side was marked, verified correct patient position, special equipment/implants available, medications/allergies/relevent history reviewed, required imaging and test results available.  Performed  Maximum sterile technique was used including antiseptics, cap, gloves, gown, hand hygiene, mask and sheet. Skin prep: Chlorhexidine; local anesthetic administered 20 gauge catheter was inserted into left radial artery using the Seldinger technique.  Evaluation Blood flow good; BP tracing good. Complications: No apparent complications.   Trevor [name] 04/25/2012

## 2012-04-26 ENCOUNTER — Inpatient Hospital Stay (HOSPITAL_COMMUNITY): Payer: Medicare Other

## 2012-04-26 DIAGNOSIS — R579 Shock, unspecified: Secondary | ICD-10-CM

## 2012-04-26 DIAGNOSIS — J9601 Acute respiratory failure with hypoxia: Secondary | ICD-10-CM

## 2012-04-26 LAB — BLOOD GAS, ARTERIAL
Acid-Base Excess: 14.2 mmol/L — ABNORMAL HIGH (ref 0.0–2.0)
Bicarbonate: 39.1 meq/L — ABNORMAL HIGH (ref 20.0–24.0)
Drawn by: 331761
FIO2: 0.8 %
MECHVT: 450 mL
O2 Saturation: 98.7 %
PEEP: 5 cmH2O
Patient temperature: 98.6
RATE: 16 {breaths}/min
TCO2: 40.8 mmol/L (ref 0–100)
pCO2 arterial: 56.7 mmHg — ABNORMAL HIGH (ref 35.0–45.0)
pH, Arterial: 7.452 — ABNORMAL HIGH (ref 7.350–7.450)
pO2, Arterial: 111 mmHg — ABNORMAL HIGH (ref 80.0–100.0)

## 2012-04-26 LAB — BASIC METABOLIC PANEL WITH GFR
BUN: 27 mg/dL — ABNORMAL HIGH (ref 6–23)
CO2: 38 meq/L — ABNORMAL HIGH (ref 19–32)
Calcium: 8.9 mg/dL (ref 8.4–10.5)
Chloride: 96 meq/L (ref 96–112)
Creatinine, Ser: 1.02 mg/dL (ref 0.50–1.35)
GFR calc Af Amer: 76 mL/min — ABNORMAL LOW
GFR calc non Af Amer: 65 mL/min — ABNORMAL LOW
Glucose, Bld: 154 mg/dL — ABNORMAL HIGH (ref 70–99)
Potassium: 3.6 meq/L (ref 3.5–5.1)
Sodium: 139 meq/L (ref 135–145)

## 2012-04-26 LAB — CARDIAC PANEL(CRET KIN+CKTOT+MB+TROPI)
CK, MB: 3.1 ng/mL (ref 0.3–4.0)
Relative Index: INVALID (ref 0.0–2.5)
Relative Index: INVALID (ref 0.0–2.5)
Total CK: 59 U/L (ref 7–232)
Total CK: 54 U/L (ref 7–232)
Troponin I: <0.3 ng/mL
Troponin I: <0.3 ng/mL (ref <0.30)

## 2012-04-26 LAB — CORTISOL: Cortisol, Plasma: 66.9 ug/dL

## 2012-04-26 LAB — CBC
HCT: 33.9 % — ABNORMAL LOW (ref 39.0–52.0)
Hemoglobin: 10.9 g/dL — ABNORMAL LOW (ref 13.0–17.0)
MCV: 77.4 fL — ABNORMAL LOW (ref 78.0–100.0)
RBC: 4.38 MIL/uL (ref 4.22–5.81)
RDW: 17.9 % — ABNORMAL HIGH (ref 11.5–15.5)
WBC: 13.2 10*3/uL — ABNORMAL HIGH (ref 4.0–10.5)

## 2012-04-26 LAB — PROCALCITONIN: Procalcitonin: 0.16 ng/mL

## 2012-04-26 LAB — HEPARIN LEVEL (UNFRACTIONATED): Heparin Unfractionated: 0.66 IU/mL (ref 0.30–0.70)

## 2012-04-26 MED ORDER — BIOTENE DRY MOUTH MT LIQD: 15.0000 mL | Freq: Four times a day (QID) | OROMUCOSAL | Status: DC

## 2012-04-26 MED ORDER — SODIUM CHLORIDE 0.9 % IV SOLN
750.0000 mg | Freq: Two times a day (BID) | INTRAVENOUS | Status: DC
  Administered 2012-04-26 – 2012-04-27 (×2): 750 mg via INTRAVENOUS
  Filled 2012-04-26 (×3): qty 750

## 2012-04-26 MED ORDER — "THROMBI-PAD 3""X3"" EX PADS"
1.0000 | MEDICATED_PAD | Freq: Once | CUTANEOUS | Status: AC
  Administered 2012-04-26: 1 via TOPICAL
  Filled 2012-04-26: qty 1

## 2012-04-26 MED ORDER — CHLORHEXIDINE GLUCONATE 0.12 % MT SOLN
15.0000 mL | Freq: Two times a day (BID) | OROMUCOSAL | Status: DC
Start: 2012-04-26 — End: 2012-04-26

## 2012-04-26 MED ORDER — HEPARIN SODIUM (PORCINE) 5000 UNIT/ML IJ SOLN
5000.0000 [IU] | Freq: Three times a day (TID) | INTRAMUSCULAR | Status: DC
  Administered 2012-04-26 – 2012-05-08 (×32): 5000 [IU] via SUBCUTANEOUS
  Filled 2012-04-26 (×38): qty 1

## 2012-04-26 MED ORDER — VANCOMYCIN HCL 1000 MG IV SOLR
750.0000 mg | INTRAVENOUS | Status: AC
  Administered 2012-04-26: 750 mg via INTRAVENOUS
  Filled 2012-04-26: qty 750

## 2012-04-26 MED ORDER — METHYLPREDNISOLONE SODIUM SUCC 40 MG IJ SOLR
40.0000 mg | Freq: Two times a day (BID) | INTRAMUSCULAR | Status: DC
  Administered 2012-04-26 – 2012-04-27 (×3): 40 mg via INTRAVENOUS
  Filled 2012-04-26 (×5): qty 1

## 2012-04-26 MED ORDER — FENTANYL CITRATE 0.05 MG/ML IJ SOLN
50.0000 ug | INTRAMUSCULAR | Status: DC | PRN
  Administered 2012-04-30 – 2012-05-01 (×2): 50 ug via INTRAVENOUS
  Administered 2012-05-01 – 2012-05-02 (×3): 100 ug via INTRAVENOUS
  Filled 2012-04-26 (×5): qty 2

## 2012-04-26 MED ORDER — HEPARIN (PORCINE) IN NACL 100-0.45 UNIT/ML-% IJ SOLN
900.0000 [IU]/h | INTRAMUSCULAR | Status: DC
  Administered 2012-04-26: 900 [IU]/h via INTRAVENOUS
  Filled 2012-04-26: qty 250

## 2012-04-26 MED ORDER — CHLORHEXIDINE GLUCONATE 0.12 % MT SOLN
15.0000 mL | Freq: Two times a day (BID) | OROMUCOSAL | Status: DC
  Administered 2012-04-26 – 2012-05-02 (×8): 15 mL via OROMUCOSAL
  Filled 2012-04-26 (×5): qty 15

## 2012-04-26 MED ORDER — IPRATROPIUM-ALBUTEROL 18-103 MCG/ACT IN AERO
6.0000 | INHALATION_SPRAY | RESPIRATORY_TRACT | Status: DC
  Administered 2012-04-26 (×2): 6 via RESPIRATORY_TRACT
  Administered 2012-04-26 (×3): 8 via RESPIRATORY_TRACT
  Administered 2012-04-27: 6 via RESPIRATORY_TRACT
  Administered 2012-04-27 (×3): 8 via RESPIRATORY_TRACT
  Administered 2012-04-27: 6 via RESPIRATORY_TRACT
  Administered 2012-04-27: 8 via RESPIRATORY_TRACT
  Administered 2012-04-28: 6 via RESPIRATORY_TRACT
  Administered 2012-04-28 (×2): 8 via RESPIRATORY_TRACT
  Administered 2012-04-28: 6 via RESPIRATORY_TRACT
  Administered 2012-04-28: 8 via RESPIRATORY_TRACT
  Administered 2012-04-29 (×4): 6 via RESPIRATORY_TRACT
  Filled 2012-04-26: qty 14.7

## 2012-04-26 NOTE — Progress Notes (Signed)
Received patient at 2040 to room 2903. Family made aware of transfer. MD at bedside.

## 2012-04-26 NOTE — Consult Note (Signed)
Name: Trevor [name] MRN: [id number] DOB: 07-07-1925    LOS: 3  PULMONARY / CRITICAL CARE MEDICINE CONSULT  HPI:   78 year old male with PMH relevant for CAD, systolic CHF, Vfib, HTN, COPD Admitted initially to the Cardiology service for management of acute decompensated CHF. He was also started on heparin drip for new A. Fib. He was lethargic at admission and there was concern for hypothyroidism but TSH was normal. CT scan of the head did not show acute intracranial process. He had been treated since admission with IV lasix. Today he developed acute hypercarbic and hypoxemic respiratory failure and was initially given a trial of BiPAP. Later on a code blue was called. He developed Vtach requiring CPR for a brief period of time with return to spontaneous circulation. He was intubated and started on mechanical ventilation and was transferred to the ICU. At the time of my exam in the ICU the patient is intubated, grimacing, moving all four extremities. The monitor shows severe hypotension (50/30) and HR of 55. He is saturating 100% on 100% FiO2. We started Dopamine via peripheral line since there was no central access.  Past Medical History  Diagnosis Date  . CAD (coronary artery disease)     Remote inferior MI in 1984, complicated by VFIB requiring multiple shocks, cardiogenic shock and CHF  . CHF (congestive heart failure)     Last echo in 2005 with EF 50 to 55%  . Arrhythmia     Hx of VFIB in 1984  . Abnormal nuclear cardiac imaging test     Lexiscan in Jan 2011 with EF 44% and large area of infarct involving the inferoseptal basal, inferior and inferolateral wall.   . Iron deficiency anemia   . HTN (hypertension)   . Bladder cancer   . Hip fracture   . Chronic back pain   . Right bundle branch block     Chronic right bundle-branch block.   . History of tobacco abuse   . Cellulitis      Chronic cellulitis right ankle.  . Anemia   . CAD (coronary artery disease)     With remote large  inferior MI dating back to 1984 with associated VFib arrest. Was not cathed.   . Dyslipidemia   . Nonhealing nonsurgical wound      Infected, chronic nonhealing wound, right ankle  . Postoperative ileus   . Hypercholesterolemia   . Hyponatremia   . Inguinal hernia     Strangulated recurrent right inguinal hernia  . Depression   . Osteoarthritis     severe  . Chronic constipation   . Palpitations   . Systolic heart failure     EF is 25% per echo in October 2012 with global hypokinesis and posterior and inferior akinesis, as well as grade 2 diastolic dysfunction, moderate TR and mild pulmonary HTN   Past Surgical History  Procedure Date  . Hernia repair   . Hip surgery   . Ankle surgery     past right ankle fracture   Prior to Admission medications   Medication Sig Start Date End Date Taking? Authorizing Provider  acetaminophen (TYLENOL) 325 MG tablet Take 650 mg by mouth every 6 (six) hours as needed. For pain   Yes Historical Provider, MD  aspirin 325 MG EC tablet Take 325 mg by mouth daily.    Yes Historical Provider, MD  atorvastatin (LIPITOR) 20 MG tablet Take 20 mg by mouth daily. 12/19/11  Yes Cassell Clement, MD  calcium carbonate (OS-CAL) 600 MG TABS Take 600 mg by mouth 2 (two) times daily with a meal.     Yes Historical Provider, MD  carvedilol (COREG) 6.25 MG tablet Take 1 tablet (6.25 mg total) by mouth 2 (two) times daily with a meal. 09/04/11  Yes Cassell Clement, MD  chlordiazePOXIDE-amitriptyline (LIMBITROL DS) 10-25 MG TABS Take 1 tablet by mouth at bedtime. 12/19/11  Yes Cassell Clement, MD  clopidogrel (PLAVIX) 75 MG tablet Take 1 tablet (75 mg total) by mouth daily. 12/19/11  Yes Cassell Clement, MD  ferrous gluconate (FERGON) 325 MG tablet Take 325 mg by mouth daily with breakfast.   Yes Historical Provider, MD  furosemide (LASIX) 40 MG tablet Take 1 tablet (40 mg total) by mouth 2 (two) times daily. 09/04/11 09/03/12 Yes Cassell Clement, MD  Multiple Vitamin  (MULTIVITAMIN WITH MINERALS) TABS Take 1 tablet by mouth daily.   Yes Historical Provider, MD  nitroGLYCERIN (NITRODUR - DOSED IN MG/24 HR) 0.4 mg/hr Place 1 patch (0.4 mg total) onto the skin daily. 12/19/11 12/18/12 Yes Cassell Clement, MD  nitroGLYCERIN (NITROSTAT) 0.4 MG SL tablet Place 0.4 mg under the tongue every 5 (five) minutes as needed. For chest pain   Yes Historical Provider, MD  potassium chloride SA (K-DUR,KLOR-CON) 20 MEQ tablet Take 1 tablet (20 mEq total) by mouth 2 (two) times daily. 12/19/11  Yes Cassell Clement, MD  spironolactone (ALDACTONE) 25 MG tablet Take 12.5 mg by mouth daily. 09/04/11  Yes Cassell Clement, MD  tiotropium (SPIRIVA) 18 MCG inhalation capsule Place 1 capsule (18 mcg total) into inhaler and inhale daily. 12/19/11  Yes Cassell Clement, MD   Allergies Allergies  Allergen Reactions  . Morphine And Related Anaphylaxis    Cardiac arrest  . Other Other (See Comments)    ALL SEDATIVES;  Lethargic, can hardly function, etc.  . Lisinopril Cough  . Vioxx (Rofecoxib) Other (See Comments)    Lethargic, can hardly function, very hard to wake up    Family History Family History  Problem Relation Age of Onset  . Heart disease Father   . Heart attack Father   . Hypertension Father   . Emphysema Brother   . Coronary artery disease Sister     X5 CABG still living  . Heart failure Sister   . Hypertension Mother    Social History  reports that he quit smoking about 28 years ago. His smoking use included Cigarettes. He has a 26 pack-year smoking history. He has quit using smokeless tobacco. His smokeless tobacco use included Chew. He reports that he does not drink alcohol or use illicit drugs.  Review Of Systems:  Unable to provide.    Vital Signs: Temp:  [97.9 F (36.6 C)-98.6 F (37 C)] 98.4 F (36.9 C) (07/05 2330) Pulse Rate:  [55-95] 77  (07/06 0005) Resp:  [15-40] 16  (07/06 0005) BP: (43-122)/(15-68) 122/53 mmHg (07/06 0005) SpO2:  [90 %-100  %] 99 % (07/06 0005) FiO2 (%):  [35 %-100 %] 80 % (07/06 0005)  Physical Examination: General:  Intubated, mechanically ventilated, no acute distress Neuro:  Grimacing, moving all 4 extremities, synchronous, nonfocal HEENT:  PERRL, pink conjunctivae, moist membranes Neck:  Supple, no JVD   Cardiovascular:  RRR, no M/R/G Lungs:  Bilateral diminished air entry, Bilateral diffuse crackles. No wheezing. Abdomen:  Soft, nontender, nondistended, bowel sounds present Musculoskeletal:  Moves all extremities, no pedal edema Skin:  No rash  Principal Problem:  *Lethargy Active Problems:  Ischemic heart disease  Congestive heart failure with left ventricular systolic dysfunction  COPD (chronic obstructive pulmonary disease)  Atrial fibrillation  Respiratory failure with hypercapnia   ASSESSMENT AND PLAN 78 year old male with PMH mentioned above. Admitted with decompensated CHF, new A. Fib. No formal diagnosis of COPD in the past but clear evidence of chronic CO2 retention on blood work and history of heavy smoking. Since admission lethargic likely secondary to progressive hypercapnic respiratory failure. Failed BiPAP. Code blue called for respiratory failure and Vtach. Required CPR for a brief period of time. Intubated on MV. Profound hypotension requiring dopamine and norepinephrine. Unclear etiology, in the differential sepsis secondary to pneumonia, systolic heart failure, PE. To unstable for CTA and he is on heparin drip already. Central line placed. CVP 14.    PULMONARY  Lab 04/25/12 2239 04/25/12 2140 04/25/12 1926 04/24/12 0800  PHART 7.571* -- 7.345* 7.355  PCO2ART 45.2* -- 79.1* 68.7*  PO2ART 119.0* -- 53.1* 72.0*  HCO3 41.6* -- 42.0* 37.4*  O2SAT 99.0 76.3 84.6 93.4   Ventilator Settings: Vent Mode:  [-] PRVC FiO2 (%):  [35 %-100 %] 80 % Set Rate:  [16 bmp] 16 bmp Vt Set:  [450 mL] 450 mL PEEP:  [5 cmH20] 5 cmH20 Plateau Pressure:  [17 cmH20-19 cmH20] 19 cmH20 CXR:   Bilateral lung infiltrates, likely pulmonary edema but pneumonia also in the differential. ETT:  Needs to be pulled out 2 to 3 cm  A:   1) Acute hypercarbic and hypoxemic respiratory failure 2) COPD with evidence of chronic hypercarbia. 3) Possible pneumonia with septic shock vs cardiogenic shock, PE also in the differential, less likely since he was on heparin drip. To unstable for CTA. On heparin drip already.  P:   - Intubated on mechanical ventilation - PRVC, Vt 8cc/kg, RR: 16, PEEP: 5, FiO2 to keep O2 sat >92% - VAP prevention order set - Will start empiric antibiotics with Zosyn and vancomycin. - Combivent q4 hrs  CARDIOVASCULAR  Lab 04/25/12 2248 04/24/12 0435 04/23/12 2236 04/23/12 1626  TROPONINI <0.30 <0.30 <0.30 <0.30  LATICACIDVEN 2.0 -- -- --  PROBNP 14991.0* -- -- 4908.0*   ECG:  Sinus bradycardia Lines: Left IJ placed today (04/25/12)  A:  1) Profound hypotension, cardiogenic vs septic shock 2) Repeat BNP 14991 favors cardiogenic shock but sepsis cannot be ruled out. Bedside echocardiogram with 25% LVEF. Chest X ray with bilateral diffuse infiltrates P:  - Currently on dopamine and norepinephrine - Will follow cardiac enzymes - Mixed venous sat. - Will hold IV fluids for now - Will hold antihypertensives, diuretics and beta blockers given profound hypotension and pressors requirements.  RENAL  Lab 04/25/12 2248 04/25/12 1736 04/25/12 0415 04/24/12 0435 04/23/12 1626  NA 139 138 140 140 140  K 3.6 4.5 -- -- --  CL 94* 94* 96 97 98  CO2 35* 37* 36* 36* 34*  BUN 25* 21 18 18 16   CREATININE 1.18 1.09 0.97 0.98 1.02  CALCIUM 9.3 9.4 9.1 9.3 9.2  MG 1.8 -- -- -- 2.5  PHOS 3.7 -- -- -- --   Intake/Output      07/05 0701 - 07/06 0700   I.V. (mL/kg) 356.3 (4.7)   IV Piggyback 4   Total Intake(mL/kg) 360.3 (4.8)   Urine (mL/kg/hr) 700 (0.4)   Total Output 700   Net -339.7        Foley:  Placed today (04/25/12)  A:   Creatinine slightly up, likely  secondary to diuresis P:   -  Will hold diuretics for now - Monitor BMP  GASTROINTESTINAL  Lab 04/25/12 2248 04/23/12 1626  AST 77* 61*  ALT 43 34  ALKPHOS 127* 129*  BILITOT 1.6* 1.1  PROT 8.2 7.8  ALBUMIN 2.6* 2.6*    A:   No issues P:   - GI prophylaxis with protonix  HEMATOLOGIC  Lab 04/25/12 2248 04/25/12 0415 04/24/12 1035 04/23/12 2124 04/23/12 1626  HGB 11.4* 11.2* 11.1* 10.4* --  HCT 35.2* 34.5* 35.3* 32.2* --  PLT 144* 307 135* 145* --  INR -- -- -- 1.30 1.30  APTT -- -- -- -- 36   A:  1) Anemia stable     INFECTIOUS  Lab 04/25/12 2248 04/25/12 0415 04/24/12 1035 04/23/12 2124  WBC 13.3* 8.0 5.9 6.7  PROCALCITON 0.16 -- -- --   Cultures: Blood, tracheal and urine cultures sent.  Antibiotics: - Zosyn (04/25/12) - Vancomycin (04/25/12)  A:  1) Shock, cardiogenic vs septic P:   - Will follow cultures. - Antibiotics as above.   ENDOCRINE  Lab 04/25/12 2339 04/23/12 1611  GLUCAP 154* 98   A:   No history of diabetes   NEUROLOGIC  A:   Grimacing, moving all 4 extremities. P:   - Intermittent sedation with Versed.  BEST PRACTICE / DISPOSITION - Level of Care:  ICU - Primary Service:  Cardiology - Consultants:  CCM - Code Status:  DNR - Diet:  NPO - DVT Px:  Heparin drip - GI Px:  Protonix - Skin Integrity:  Intact. - Social / Family:  Family updated personally (daughter and wife) in detail and aware of critical condition and poor prognosis of Mr. Mckeithan. They decided to transition to DNR. We will continue all efforts for now except for CPR. They may withdraw care if no improvement over the next hours to days.  The patient is critically ill with multiple organ systems failure and requires high complexity decision making for assessment and support, frequent evaluation and titration of therapies, application of advanced monitoring technologies and extensive interpretation of multiple databases.   Critical Care Time devoted to patient  care services described in this note is: 1 Hour  Overton Mam, M.D. Pulmonary and Critical Care Medicine [facility] Pager: [phone]  04/26/2012, 1:52 AM

## 2012-04-26 NOTE — Progress Notes (Signed)
ANTIBIOTIC CONSULT NOTE - INITIAL  Pharmacy Consult for vancomycin Indication: rule out pneumonia  Allergies  Allergen Reactions  . Morphine And Related Anaphylaxis    Cardiac arrest  . Other Other (See Comments)    ALL SEDATIVES;  Lethargic, can hardly function, etc.  . Lisinopril Cough  . Vioxx (Rofecoxib) Other (See Comments)    Lethargic, can hardly function, very hard to wake up    Patient Measurements: Height: 5\' 2"  (157.5 cm) Weight: 166 lb 3.6 oz (75.4 kg) IBW/kg (Calculated) : 54.6   Vital Signs: Temp: 98.4 F (36.9 C) (07/05 2330) Temp src: Oral (07/05 2330) BP: 122/53 mmHg (07/06 0005) Pulse Rate: 77  (07/06 0005)  Labs:  Basename 04/25/12 2248 04/25/12 1736 04/25/12 0415 04/24/12 1035  WBC 13.3* -- 8.0 5.9  HGB 11.4* -- 11.2* 11.1*  PLT 144* -- 307 135*  LABCREA -- -- -- --  CREATININE 1.18 1.09 0.97 --   Estimated Creatinine Clearance: 41.5 ml/min (by C-G formula based on Cr of 1.18).  Microbiology: Recent Results (from the past 720 hour(s))  MRSA PCR SCREENING     Status: Normal   Collection Time   04/23/12  5:09 PM      Component Value Range Status Comment   MRSA by PCR NEGATIVE  NEGATIVE Final     Medical History: Past Medical History  Diagnosis Date  . CAD (coronary artery disease)     Remote inferior MI in 1984, complicated by VFIB requiring multiple shocks, cardiogenic shock and CHF  . CHF (congestive heart failure)     Last echo in 2005 with EF 50 to 55%  . Arrhythmia     Hx of VFIB in 1984  . Abnormal nuclear cardiac imaging test     Lexiscan in Jan 2011 with EF 44% and large area of infarct involving the inferoseptal basal, inferior and inferolateral wall.   . Iron deficiency anemia   . HTN (hypertension)   . Bladder cancer   . Hip fracture   . Chronic back pain   . Right bundle branch block     Chronic right bundle-branch block.   . History of tobacco abuse   . Cellulitis      Chronic cellulitis right ankle.  . Anemia   . CAD  (coronary artery disease)     With remote large inferior MI dating back to 1984 with associated VFib arrest. Was not cathed.   . Dyslipidemia   . Nonhealing nonsurgical wound      Infected, chronic nonhealing wound, right ankle  . Postoperative ileus   . Hypercholesterolemia   . Hyponatremia   . Inguinal hernia     Strangulated recurrent right inguinal hernia  . Depression   . Osteoarthritis     severe  . Chronic constipation   . Palpitations   . Systolic heart failure     EF is 25% per echo in October 2012 with global hypokinesis and posterior and inferior akinesis, as well as grade 2 diastolic dysfunction, moderate TR and mild pulmonary HTN    Medications:  Prescriptions prior to admission  Medication Sig Dispense Refill  . acetaminophen (TYLENOL) 325 MG tablet Take 650 mg by mouth every 6 (six) hours as needed. For pain      . aspirin 325 MG EC tablet Take 325 mg by mouth daily.       [name] atorvastatin (LIPITOR) 20 MG tablet Take 20 mg by mouth daily.      . calcium carbonate (OS-CAL) 600  MG TABS Take 600 mg by mouth 2 (two) times daily with a meal.        . carvedilol (COREG) 6.25 MG tablet Take 1 tablet (6.25 mg total) by mouth 2 (two) times daily with a meal.  180 tablet  3  . chlordiazePOXIDE-amitriptyline (LIMBITROL DS) 10-25 MG TABS Take 1 tablet by mouth at bedtime.      . clopidogrel (PLAVIX) 75 MG tablet Take 1 tablet (75 mg total) by mouth daily.  90 tablet  3  . ferrous gluconate (FERGON) 325 MG tablet Take 325 mg by mouth daily with breakfast.      . furosemide (LASIX) 40 MG tablet Take 1 tablet (40 mg total) by mouth 2 (two) times daily.  180 tablet  3  . Multiple Vitamin (MULTIVITAMIN WITH MINERALS) TABS Take 1 tablet by mouth daily.      . nitroGLYCERIN (NITRODUR - DOSED IN MG/24 HR) 0.4 mg/hr Place 1 patch (0.4 mg total) onto the skin daily.  90 patch  3  . nitroGLYCERIN (NITROSTAT) 0.4 MG SL tablet Place 0.4 mg under the tongue every 5 (five) minutes as needed. For  chest pain      . potassium chloride SA (K-DUR,KLOR-CON) 20 MEQ tablet Take 1 tablet (20 mEq total) by mouth 2 (two) times daily.  180 tablet  3  . spironolactone (ALDACTONE) 25 MG tablet Take 12.5 mg by mouth daily.      [name] tiotropium (SPIRIVA) 18 MCG inhalation capsule Place 1 capsule (18 mcg total) into inhaler and inhale daily.  90 capsule  3  . DISCONTD: atorvastatin (LIPITOR) 20 MG tablet Take 1 tablet (20 mg total) by mouth daily.  90 tablet  3  . DISCONTD: spironolactone (ALDACTONE) 25 MG tablet Take 0.5 tablets (12.5 mg total) by mouth as directed.  90 tablet  3   Scheduled:    . sodium chloride   Intravenous Once  . a stronger pump book   Does not apply Once  . albuterol-ipratropium  4 puff Inhalation Q4H  . antiseptic oral rinse  15 mL Mouth Rinse BID  . aspirin EC  81 mg Oral Daily  . atorvastatin  20 mg Oral q1800  . calcium-vitamin D  1 tablet Oral BID  . clopidogrel  75 mg Oral Q breakfast  . feeding supplement  237 mL Oral BID BM  . ferrous sulfate  325 mg Oral Q breakfast  . furosemide  40 mg Intravenous Once  . levothyroxine  25 mcg Oral QAC breakfast  . pantoprazole (PROTONIX) IV  40 mg Intravenous QHS  . piperacillin-tazobactam (ZOSYN)  IV  3.375 g Intravenous Q8H  . sodium chloride  3 mL Intravenous Q12H  . tiotropium  18 mcg Inhalation Q2000  . vancomycin  750 mg Intravenous NOW  . vancomycin  750 mg Intravenous Q12H  . DISCONTD: aspirin EC  325 mg Oral Daily  . DISCONTD: carvedilol  6.25 mg Oral BID WC  . DISCONTD: furosemide  40 mg Intravenous Q8H  . DISCONTD: furosemide  80 mg Intravenous Q8H  . DISCONTD: furosemide  80 mg Intravenous Q8H  . DISCONTD: furosemide  40 mg Oral BID  . DISCONTD: lisinopril  2.5 mg Oral BID  . DISCONTD: losartan  12.5 mg Oral BID  . DISCONTD: potassium chloride  20 mEq Oral Daily  . DISCONTD: spironolactone  12.5 mg Oral Daily    Assessment: 78yo male s/p cardiac arrest with unclear etiology to begin IV ABX for possible  PNA.  Goal  of Therapy:  Vancomycin trough level 15-20 mcg/ml  Plan:  Will begin vancomycin 750mg  IV Q12H and monitor CBC, Cx, levels prn.  Colleen Can PharmD BCPS 04/26/2012,1:58 AM

## 2012-04-26 NOTE — Progress Notes (Signed)
ANTICOAGULATION CONSULT NOTE - Follow Up Consult  Pharmacy Consult for heparin Indication: atrial fibrillation  Allergies  Allergen Reactions  . Morphine And Related Anaphylaxis    Cardiac arrest  . Other Other (See Comments)    ALL SEDATIVES;  Lethargic, can hardly function, etc.  . Lisinopril Cough  . Vioxx (Rofecoxib) Other (See Comments)    Lethargic, can hardly function, very hard to wake up    Patient Measurements: Height: 5\' 2"  (157.5 cm) Weight: 170 lb 3.1 oz (77.2 kg) IBW/kg (Calculated) : 54.6  Heparin Dosing Weight: 69 kg  Vital Signs: Temp: 99.5 F (37.5 C) (07/06 0400) Temp src: Oral (07/06 0400) BP: 117/53 mmHg (07/06 0700) Pulse Rate: 78  (07/06 0800)  Labs:  Basename 04/26/12 0542 04/26/12 0500 04/25/12 2248 04/25/12 1736 04/25/12 0415 04/24/12 1750 04/24/12 0435 04/23/12 2124 04/23/12 1626  HGB -- 10.9* 11.4* -- -- -- -- -- --  HCT -- 33.9* 35.2* -- 34.5* -- -- -- --  PLT -- 142* 144* -- 307 -- -- -- --  APTT -- -- -- -- -- -- -- -- 36  LABPROT -- -- -- -- -- -- -- 16.4* 16.4*  INR -- -- -- -- -- -- -- 1.30 1.30  HEPARINUNFRC -- 0.66 -- -- 0.58 0.29* -- -- --  CREATININE -- 1.02 1.18 1.09 -- -- -- -- --  CKTOTAL 59 -- 46 -- -- -- 30 -- --  CKMB 3.1 -- 3.4 -- -- -- 3.4 -- --  TROPONINI <0.30 -- <0.30 -- -- -- <0.30 -- --    Estimated Creatinine Clearance: 48.5 ml/min (by C-G formula based on Cr of 1.02).   Medications:  Infusions:    . DOPamine 14.996 mcg/kg/min (04/26/12 0828)  . heparin 950 Units/hr (04/25/12 2300)  . norepinephrine (LEVOPHED) Adult infusion 5.013 mcg/min (04/26/12 0300)    Assessment: 78 yo male on IV heparin at 950 units/hr for new afib.  Heparin level is therapeutic this AM, although trending up slightly.  Platelet count = 145 on 7/3; 135 on 7/4; 307 on 7/5 (?); and 142 today.  Will watch for now.  Goal of Therapy:  Heparin level 0.3-0.7 units/ml Monitor platelets by anticoagulation protocol: Yes   Plan:  1. Reduce  heparin a little to 900 units/hr to keep in therapeutic range. 2. Continue daily heparin level and CBC, monitor platelet trend.  Kyndel Egger, Gwenlyn Found 04/26/2012,8:29 AM

## 2012-04-26 NOTE — Progress Notes (Signed)
Name: Trevor [name] MRN: [id number] DOB: Mar 13, 1925    LOS: 3  PULMONARY / CRITICAL CARE MEDICINE   HPI:   78 year old male with PMH relevant for CAD, systolic CHF, Vfib, HTN, COPD Admitted initially to the Cardiology service for management of acute decompensated CHF. He was also started on heparin drip for new A. Fib. He was lethargic at admission and there was concern for hypothyroidism but TSH was normal. CT scan of the head did not show acute intracranial process. He had been treated since admission with IV lasix. Today he developed acute hypercarbic and hypoxemic respiratory failure and was initially given a trial of BiPAP. Later on a code blue was called. He developed Vtach requiring CPR for a brief period of time with return to spontaneous circulation. He was intubated and started on mechanical ventilation and was transferred to the ICU. At the time of my exam in the ICU the patient is intubated, grimacing, moving all four extremities. The monitor shows severe hypotension (50/30) and HR of 55. He is saturating 100% on 100% FiO2. We started Dopamine via peripheral line since there was no central access.    Vital Signs: Temp:  [97.9 F (36.6 C)-99.5 F (37.5 C)] 99.5 F (37.5 C) (07/06 0400) Pulse Rate:  [55-95] 78  (07/06 0700) Resp:  [15-40] 16  (07/06 0700) BP: (43-122)/(15-68) 114/56 mmHg (07/06 0500) SpO2:  [90 %-100 %] 98 % (07/06 0700) FiO2 (%):  [35 %-100 %] 60 % (07/06 0700) Weight:  [77.2 kg (170 lb 3.1 oz)] 77.2 kg (170 lb 3.1 oz) (07/06 0500)  Physical Examination: Gen: awake, alert on vent, comfortable HEENT: NCAT, PERRL, EOMi, ETT in place PULM: crackles in bases bilaterally CV: RRR, no mgr, no JVD AB: BS+, soft, nontender, no hsm Ext: warm, scd's Derm: no rash or skin breakdown Neuro: awake and alert, moves all four ext, follows commands, interactive    Principal Problem:  *Lethargy Active Problems:  Ischemic heart disease  Congestive heart failure with  left ventricular systolic dysfunction  COPD (chronic obstructive pulmonary disease)  Atrial fibrillation  Respiratory failure with hypercapnia   ASSESSMENT AND PLAN 78 y/o male with CHF, CAD and likely COPD admitted 7/3 with lethargy but developed respiratory failure on 7/5 leading to PEA arrest and VTach. He was intubated and found to be markedly hypotensive afterwards requiring vasopressors.  DDx includes septic shock from pneumonia vs. Cardiogenic shock. I think that respiratory failure (pneumonia, COPD?) lead to last nights code, but I don't fully understand his shock this morning (7/6) as he does not appear septic.     PULMONARY  Lab 04/26/12 0455 04/25/12 2239 04/25/12 2140 04/25/12 1926 04/24/12 0800  PHART 7.452* 7.571* -- 7.345* 7.355  PCO2ART 56.7* 45.2* -- 79.1* 68.7*  PO2ART 111.0* 119.0* -- 53.1* 72.0*  HCO3 39.1* 41.6* -- 42.0* 37.4*  O2SAT 98.7 99.0 76.3 84.6 93.4   Ventilator Settings: Vent Mode:  [-] PRVC FiO2 (%):  [35 %-100 %] 60 % Set Rate:  [16 bmp] 16 bmp Vt Set:  [450 mL] 450 mL PEEP:  [5 cmH20] 5 cmH20 Plateau Pressure:  [17 cmH20-20 cmH20] 20 cmH20 CXR:  Bilateral lung infiltrates, likely pulmonary edema but pneumonia also in the differential. ETT:  Needs to be pulled out 2 to 3 cm  A:   1) Acute hypercarbic and hypoxemic respiratory failure 2) COPD with evidence of chronic hypercarbia. 3) Pneumonia: aspiration vs. HCAP  P:   - Intubated on mechanical ventilation - PRVC, Vt  8cc/kg, RR: 16, PEEP: 5, FiO2 to keep O2 sat >92% - VAP prevention order set - Will start empiric antibiotics with Zosyn and vancomycin. - Combivent q4 hrs - Add solumedrol given concern of COPD  CARDIOVASCULAR  Lab 04/26/12 0542 04/25/12 2248 04/24/12 0435 04/23/12 2236 04/23/12 1626  TROPONINI <0.30 <0.30 <0.30 <0.30 <0.30  LATICACIDVEN -- 2.0 -- -- --  PROBNP -- 14991.0* -- -- 4908.0*   ECG:  Sinus rhythm Lines: Left IJ 04/25/12 >>  A:  1) Profound hypotension,  cardiogenic vs septic shock 2) Repeat BNP 14991 favors cardiogenic shock but sepsis cannot be ruled out. Bedside echocardiogram with 25% LVEF. Chest X ray with bilateral diffuse infiltrates P:  - Currently on dopamine and norepinephrine, improving requirments - Will follow cardiac enzymes - Send Coox now - 2D TTE per cardiology today? - Continue to hold IVF this AM given CVP 16 - Keep intubated until shock improves, secretions improve  RENAL  Lab 04/26/12 0500 04/25/12 2248 04/25/12 1736 04/25/12 0415 04/24/12 0435 04/23/12 1626  NA 139 139 138 140 140 --  K 3.6 3.6 -- -- -- --  CL 96 94* 94* 96 97 --  CO2 38* 35* 37* 36* 36* --  BUN 27* 25* 21 18 18  --  CREATININE 1.02 1.18 1.09 0.97 0.98 --  CALCIUM 8.9 9.3 9.4 9.1 9.3 --  MG -- 1.8 -- -- -- 2.5  PHOS -- 3.7 -- -- -- --   Intake/Output      07/05 0701 - 07/06 0700 07/06 0701 - 07/07 0700   P.O.     I.V. (mL/kg) 741.8 (9.6)    IV Piggyback 204    Total Intake(mL/kg) 945.8 (12.3)    Urine (mL/kg/hr) 1900 (1)    Total Output 1900    Net -954.2          Foley:  Placed 04/25/12  A:   Creatinine stable P:   - Will hold diuretics until shock better understood - Monitor BMP  GASTROINTESTINAL  Lab 04/25/12 2248 04/23/12 1626  AST 77* 61*  ALT 43 34  ALKPHOS 127* 129*  BILITOT 1.6* 1.1  PROT 8.2 7.8  ALBUMIN 2.6* 2.6*    A:   No issues P:   - GI prophylaxis with protonix  HEMATOLOGIC  Lab 04/26/12 0500 04/25/12 2248 04/25/12 0415 04/24/12 1035 04/23/12 2124 04/23/12 1626  HGB 10.9* 11.4* 11.2* 11.1* 10.4* --  HCT 33.9* 35.2* 34.5* 35.3* 32.2* --  PLT 142* 144* 307 135* 145* --  INR -- -- -- -- 1.30 1.30  APTT -- -- -- -- -- 36   A:  1) Anemia stable     INFECTIOUS  Lab 04/26/12 0500 04/25/12 2248 04/25/12 0415 04/24/12 1035 04/23/12 2124  WBC 13.2* 13.3* 8.0 5.9 6.7  PROCALCITON -- 0.16 -- -- --   Cultures: Blood, tracheal and urine cultures sent.  Antibiotics: - Zosyn (04/25/12) - Vancomycin  (04/25/12)  A:  1) Shock, cardiogenic vs septic 2) Pneumonia: HCAP vs. aspiration P:   - Will follow cultures. - Antibiotics as above.   ENDOCRINE  Lab 04/25/12 2339 04/23/12 1611  GLUCAP 154* 98   A:   No history of diabetes   NEUROLOGIC  A:   Following commands P:   - Intermittent sedation with Versed and fentanyl, titrate to RASS -2  BEST PRACTICE / DISPOSITION - Level of Care:  ICU - Primary Service:  Cardiology - Consultants:  CCM - Code Status:  DNR - Diet:  NPO -  DVT Px:  Heparin drip - GI Px:  Protonix - Skin Integrity:  Intact. - Social / Family:  Family updated by CCM on 7/5 PM, requested full support, but DNR (no CPR).   CC time 30 minutes.  Yolonda Kida PCCM Pager: [phone] Cell: [phone] If no response, call [phone]  04/26/2012, 7:12 AM

## 2012-04-26 NOTE — Progress Notes (Addendum)
Heparin gtt dc'd one hour ago and central line still oozing blood/saturated dressing and dripping down arm again. Ward Givens called and order received for thrombin patch.

## 2012-04-26 NOTE — Progress Notes (Signed)
Several attempts made tonight to put down OG tube without success. Tube curls in mouth causing patient to become agitated and BP drops.

## 2012-04-26 NOTE — Progress Notes (Signed)
Patient ID: ANIVAL PASHA, male   DOB: 01/07/1925, 78 y.o.   MRN: [id number]   Patient Name: Trevor [name] [facility] Date of Encounter: 04/26/2012    SUBJECTIVE  Intubated but awake and alert. Complains of endotracheal tube.  CURRENT MEDS    . sodium chloride   Intravenous Once  . a stronger pump book   Does not apply Once  . albuterol-ipratropium  6-8 puff Inhalation Q4H  . antiseptic oral rinse  15 mL Mouth Rinse BID  . aspirin EC  81 mg Oral Daily  . atorvastatin  20 mg Oral q1800  . calcium-vitamin D  1 tablet Oral BID  . chlorhexidine  15 mL Mouth/Throat BID  . clopidogrel  75 mg Oral Q breakfast  . feeding supplement  237 mL Oral BID BM  . ferrous sulfate  325 mg Oral Q breakfast  . furosemide  40 mg Intravenous Once  . levothyroxine  25 mcg Oral QAC breakfast  . methylPREDNISolone (SOLU-MEDROL) injection  40 mg Intravenous Q12H  . pantoprazole (PROTONIX) IV  40 mg Intravenous QHS  . piperacillin-tazobactam (ZOSYN)  IV  3.375 g Intravenous Q8H  . sodium chloride  3 mL Intravenous Q12H  . vancomycin  750 mg Intravenous NOW  . vancomycin  750 mg Intravenous Q12H  . DISCONTD: albuterol-ipratropium  4 puff Inhalation Q4H  . DISCONTD: aspirin EC  325 mg Oral Daily  . DISCONTD: carvedilol  6.25 mg Oral BID WC  . DISCONTD: furosemide  40 mg Intravenous Q8H  . DISCONTD: furosemide  80 mg Intravenous Q8H  . DISCONTD: furosemide  80 mg Intravenous Q8H  . DISCONTD: furosemide  40 mg Oral BID  . DISCONTD: lisinopril  2.5 mg Oral BID  . DISCONTD: losartan  12.5 mg Oral BID  . DISCONTD: potassium chloride  20 mEq Oral Daily  . DISCONTD: spironolactone  12.5 mg Oral Daily  . DISCONTD: tiotropium  18 mcg Inhalation Q2000    OBJECTIVE  Filed Vitals:   04/26/12 0630 04/26/12 0645 04/26/12 0700 04/26/12 0728  BP:      Pulse: 79 79 78   Temp:      TempSrc:      Resp: 16 16 16    Height:      Weight:      SpO2: 98% 98% 98% 97%    Intake/Output Summary (Last 24 hours) at 04/26/12  0755 Last data filed at 04/26/12 0600  Gross per 24 hour  Intake 945.76 ml  Output   1900 ml  Net -954.24 ml   Filed Weights   04/23/12 2152 04/25/12 0116 04/26/12 0500  Weight: 156 lb 15.5 oz (71.2 kg) 166 lb 3.6 oz (75.4 kg) 170 lb 3.1 oz (77.2 kg)    PHYSICAL EXAM  General: Pleasant, NAD. Chronically ill Neuro: Alert  Moves all extremities spontaneously. Psych: Normal affect. HEENT:  Normal  Neck: Supple  Lungs:  Resp regular and unlabored, coarse breath sounds bilaterally. Heart: RRR no s3, s4, or murmurs. Abdomen: Soft, non-tender, non-distended, BS + x 4.  Extremities: No clubbing, cyanosis or edema. DP/PT/Radials 2+ and equal bilaterally, pneumatic cuffs in place  Accessory Clinical Findings  CBC  Basename 04/26/12 0500 04/25/12 2248 04/23/12 2124  WBC 13.2* 13.3* --  NEUTROABS -- -- 4.0  HGB 10.9* 11.4* --  HCT 33.9* 35.2* --  MCV 77.4* 78.6 --  PLT 142* 144* --   Basic Metabolic Panel  Basename 04/26/12 0500 04/25/12 2248 04/23/12 1626  NA 139 139 --  K 3.6  3.6 --  CL 96 94* --  CO2 38* 35* --  GLUCOSE 154* 152* --  BUN 27* 25* --  CREATININE 1.02 1.18 --  CALCIUM 8.9 9.3 --  MG -- 1.8 2.5  PHOS -- 3.7 --   Liver Function Tests  Basename 04/25/12 2248 04/23/12 1626  AST 77* 61*  ALT 43 34  ALKPHOS 127* 129*  BILITOT 1.6* 1.1  PROT 8.2 7.8  ALBUMIN 2.6* 2.6*   No results found for this basename: LIPASE:2,AMYLASE:2 in the last 72 hours Cardiac Enzymes  Basename 04/26/12 0542 04/25/12 2248 04/24/12 0435  CKTOTAL 59 46 30  CKMB 3.1 3.4 3.4  CKMBINDEX -- -- --  TROPONINI <0.30 <0.30 <0.30   BNP No components found with this basename: POCBNP:3 D-Dimer No results found for this basename: DDIMER:2 in the last 72 hours Hemoglobin A1C No results found for this basename: HGBA1C in the last 72 hours Fasting Lipid Panel No results found for this basename: CHOL,HDL,LDLCALC,TRIG,CHOLHDL,LDLDIRECT in the last 72 hours Thyroid Function  Tests  Basename 04/24/12 0926 04/23/12 1626  TSH -- 3.904  T4TOTAL -- --  T3FREE 2.2* --  THYROIDAB -- --    TELE  Normal sinus rhythm with first-degree block  ECG    Radiology/Studies  Dg Chest 2 View  04/24/2012  *RADIOLOGY REPORT*  Clinical Data: Weakness, shortness of breath, CHF.  CHEST - 2 VIEW  Comparison: 08/10/2011  Findings: Cardiomegaly with vascular congestion.  Low lung volumes with bibasilar atelectasis and small effusions.  No overt edema. No acute bony abnormality.  IMPRESSION: Cardiomegaly, vascular congestion.  Low lung volumes, bibasilar atelectasis.  Small effusions.  Original Report Authenticated By: Cyndie Chime, M.D.   Ct Head Wo Contrast  04/23/2012  *RADIOLOGY REPORT*  Clinical Data: Lethargic.  Questionable head trauma.  Rule out bleed.  CT HEAD WITHOUT CONTRAST  Technique:  Contiguous axial images were obtained from the base of the skull through the vertex without contrast.  Comparison: None.  Findings: Motion degraded exam.  No intracranial hemorrhage.  No CT evidence of large acute infarct.  Small vessel disease type changes.  Global atrophy without hydrocephalus.  Vascular calcifications.  No intracranial mass lesion detected on this unenhanced exam.  Minimal mucosal thickening maxillary sinuses.  IMPRESSION: No intracranial hemorrhage.  Please see above.  Original Report Authenticated By: Fuller Canada, M.D.   Dg Chest Port 1 View  04/26/2012  *RADIOLOGY REPORT*  Clinical Data: Evaluate fluid status  PORTABLE CHEST - 1 VIEW  Comparison: 04/25/2012  Findings: There is an ET tube with tip above the carina.  Left IJ catheter is identified with tip in the SVC.  Cardiac enlargement is stable.  There are low lung volumes, bilateral pleural effusions and pulmonary edema.  Compared with previous exam the interstitial edema has improved.  Similar appearance of the left rib fractures.  IMPRESSION:  1.  Slight improvement in interstitial edema pattern.  Original Report  Authenticated By: Rosealee Albee, M.D.   Dg Chest Port 1 View  04/25/2012  *RADIOLOGY REPORT*  Clinical Data: Intubation and central venous catheter placement.  PORTABLE CHEST - 1 VIEW 04/25/2012 2148 hours:  Comparison: Portable chest x-ray earlier same date 2039 hours.  Findings: Endotracheal tube tip still somewhat low, approximately 2 cm above the carina.  New left jugular central venous catheter tip in the SVC.  No evidence of pneumothorax or mediastinal hematoma. Cardiac silhouette markedly enlarged but stable.  Interstitial and airspace pulmonary edema and bilateral pleural effusions, unchanged.  Associated passive atelectasis in the lower lobes, left greater than right, unchanged.  No new pulmonary parenchymal abnormality.  IMPRESSION:  1.  Left jugular central venous catheter tip in the SVC.  No acute complicating features. 2.  Endotracheal tube tip still low, approximately 2 cm above the carina.  This should be withdrawn at least another 2-3 cm. 3.  Stable CHF and bilateral pleural effusions with associated passive atelectasis in the lower lobes.  Original Report Authenticated By: Arnell Sieving, M.D.   Dg Chest Port 1 View  04/25/2012  *RADIOLOGY REPORT*  Clinical Data: Respiratory arrest  PORTABLE CHEST - 1 VIEW  Comparison: Chest radiograph 04/24/2012  Findings: Patient rotated rightward.  The endotracheal tube appears low measuring 11 mm from carina.  Stable enlarged cardiac silhouette.  There is bilateral perihilar air space disease which increased from prior.  Persistent left basilar atelectasis.  There is small bilateral pleural effusions.  IMPRESSION:  1.  Endotracheal tube appears low although somewhat difficult to define the carina on this rotated exam.  Consider retraction by 3 to 4 cm. 2.  Perihilar air space disease is increased. 3.  Bilateral pleural effusions basilar atelectasis.  Findings discussed with respiratory therapist Pat on 04/25/2012 and 20 45  hours  Original Report  Authenticated By: Genevive Bi, M.D.    ASSESSMENT AND PLAN  Principal Problem:  *Lethargy Active Problems:  Ischemic heart disease  Congestive heart failure with left ventricular systolic dysfunction  COPD (chronic obstructive pulmonary disease)  Atrial fibrillation  Respiratory failure with hypercapnia  Acute respiratory failure with hypoxia  Shock   Currently stable with pressor support. Discussed with pulmonary critical care. Continue supportive therapy. Active diagnosis currently is cardiogenic shock and respiratory failure. Rule out sepsis. Family not present this morning.   Signed, Valera Castle MD

## 2012-04-26 NOTE — Progress Notes (Signed)
This rn has replaced dressing around central line site twice. Has saturated dressing and dripping down arm both times. Occlusive dressing not controlling bleeding. Call placed to [facility].Await new orders

## 2012-04-26 NOTE — Progress Notes (Signed)
INITIAL ADULT NUTRITION ASSESSMENT Date: 04/26/2012   Time: 1:22 PM Reason for Assessment: Ventilated pt  Intervention: If family desires aggressive nutrition, recommend TF of Oxepa start at 19ml/hr increase by 10ml q4hr to goal of 33ml/hr which will provide 1080 calories, 45g protein, free water. This will meet 70% energy needs r/t ASPEN guidelines for permissive enteral underfeeding in obese adults. If IVF d/c recommend water flushes q6hr.  ASSESSMENT: Male 78 y.o.  Dx: Lethargy  Hx:  Past Medical History  Diagnosis Date  . CAD (coronary artery disease)     Remote inferior MI in 1984, complicated by VFIB requiring multiple shocks, cardiogenic shock and CHF  . CHF (congestive heart failure)     Last echo in 2005 with EF 50 to 55%  . Arrhythmia     Hx of VFIB in 1984  . Abnormal nuclear cardiac imaging test     Lexiscan in Jan 2011 with EF 44% and large area of infarct involving the inferoseptal basal, inferior and inferolateral wall.   . Iron deficiency anemia   . HTN (hypertension)   . Bladder cancer   . Hip fracture   . Chronic back pain   . Right bundle branch block     Chronic right bundle-branch block.   . History of tobacco abuse   . Cellulitis      Chronic cellulitis right ankle.  . Anemia   . CAD (coronary artery disease)     With remote large inferior MI dating back to 1984 with associated VFib arrest. Was not cathed.   . Dyslipidemia   . Nonhealing nonsurgical wound      Infected, chronic nonhealing wound, right ankle  . Postoperative ileus   . Hypercholesterolemia   . Hyponatremia   . Inguinal hernia     Strangulated recurrent right inguinal hernia  . Depression   . Osteoarthritis     severe  . Chronic constipation   . Palpitations   . Systolic heart failure     EF is 25% per echo in October 2012 with global hypokinesis and posterior and inferior akinesis, as well as grade 2 diastolic dysfunction, moderate TR and mild pulmonary HTN   Related  Meds:  Scheduled Meds:   . sodium chloride   Intravenous Once  . a stronger pump book   Does not apply Once  . albuterol-ipratropium  6-8 puff Inhalation Q4H  . antiseptic oral rinse  15 mL Mouth Rinse BID  . aspirin EC  81 mg Oral Daily  . atorvastatin  20 mg Oral q1800  . calcium-vitamin D  1 tablet Oral BID  . chlorhexidine  15 mL Mouth/Throat BID  . clopidogrel  75 mg Oral Q breakfast  . feeding supplement  237 mL Oral BID BM  . ferrous sulfate  325 mg Oral Q breakfast  . furosemide  40 mg Intravenous Once  . levothyroxine  25 mcg Oral QAC breakfast  . methylPREDNISolone (SOLU-MEDROL) injection  40 mg Intravenous Q12H  . pantoprazole (PROTONIX) IV  40 mg Intravenous QHS  . piperacillin-tazobactam (ZOSYN)  IV  3.375 g Intravenous Q8H  . sodium chloride  3 mL Intravenous Q12H  . vancomycin  750 mg Intravenous NOW  . vancomycin  750 mg Intravenous Q12H  . DISCONTD: albuterol-ipratropium  4 puff Inhalation Q4H  . DISCONTD: antiseptic oral rinse  15 mL Mouth Rinse QID  . DISCONTD: carvedilol  6.25 mg Oral BID WC  . DISCONTD: chlorhexidine  15 mL Mouth Rinse BID  .  DISCONTD: furosemide  40 mg Intravenous Q8H  . DISCONTD: furosemide  80 mg Intravenous Q8H  . DISCONTD: furosemide  80 mg Intravenous Q8H  . DISCONTD: losartan  12.5 mg Oral BID  . DISCONTD: potassium chloride  20 mEq Oral Daily  . DISCONTD: spironolactone  12.5 mg Oral Daily  . DISCONTD: tiotropium  18 mcg Inhalation Q2000   Continuous Infusions:   . DOPamine 1 mcg/kg/min (04/26/12 0950)  . heparin 900 Units/hr (04/26/12 1200)  . norepinephrine (LEVOPHED) Adult infusion Stopped (04/26/12 1100)  . DISCONTD: heparin 950 Units/hr (04/25/12 2300)   PRN Meds:.sodium chloride, sodium chloride, acetaminophen, fentaNYL, midazolam, ondansetron (ZOFRAN) IV, sodium chloride, DISCONTD: fentaNYL, DISCONTD: fentaNYL, DISCONTD: midazolam  Ht: 5\' 2"  (157.5 cm)  Wt: 170 lb 3.1 oz (77.2 kg)  Ideal Wt: 118 lb % Ideal Wt:  144  Usual Wt: 156-167 lb earlier in the year % Usual Wt: 102-109  Wt Readings from Last 10 Encounters:  04/26/12 170 lb 3.1 oz (77.2 kg)  04/23/12 156 lb (70.761 kg)  03/03/12 166 lb (75.297 kg)  12/19/11 167 lb (75.751 kg)  10/18/11 163 lb (73.936 kg)  09/04/11 165 lb 1.9 oz (74.898 kg)  08/21/11 168 lb 6.4 oz (76.386 kg)  11/07/10 172 lb (78.019 kg)  08/07/11 189 lb 12.8 oz (86.093 kg)  07/20/11 190 lb (86.183 kg)    Body mass index is 31.13 kg/(m^2). Obesity Class I  Food/Nutrition Related Hx: Pt admitted with weakness, slurred speech, and new atrial fibrillation. Pt with CHF and was diuresed 21 pounds in October 2012 hospitalization. Pt intubated with no family present. Wife reported on admission that pt was sleeping round the clock PTA and had developed edema. Per H&P pt had c/o very poor appetite with weight loss - unsure of how much weight pt has lost versus fluid, however past records show pt's weight down 33 pounds since October 2012. Today's weight up 14 pounds compared to 04/23/12 likely r/t fluid. Pt intubated yesterday after he developed respiratory failure. Per MD discussion with family, pt was made a DNR with possible withdrawal of care if no improvement in the next hours to days. Per RN, no plans for extubation today.   MVe = 7.4 L/min Maximum temperature in the past 24 hours of 37.5 degrees Celsius   Labs:  CMP     Component Value Date/Time   NA 139 04/26/2012 0500   K 3.6 04/26/2012 0500   CL 96 04/26/2012 0500   CO2 38* 04/26/2012 0500   GLUCOSE 154* 04/26/2012 0500   BUN 27* 04/26/2012 0500   CREATININE 1.02 04/26/2012 0500   CALCIUM 8.9 04/26/2012 0500   PROT 8.2 04/25/2012 2248   ALBUMIN 2.6* 04/25/2012 2248   AST 77* 04/25/2012 2248   ALT 43 04/25/2012 2248   ALKPHOS 127* 04/25/2012 2248   BILITOT 1.6* 04/25/2012 2248   GFRNONAA 65* 04/26/2012 0500   GFRAA 76* 04/26/2012 0500    Intake/Output Summary (Last 24 hours) at 04/26/12 1351 Last data filed at 04/26/12 1200  Gross per  24 hour  Intake 1283.82 ml  Output   1550 ml  Net -266.18 ml   Last BM - 04/23/12   Diet Order: NPO   IVF:    DOPamine Last Rate: 1 mcg/kg/min (04/26/12 0950)  heparin Last Rate: 900 Units/hr (04/26/12 1200)  norepinephrine (LEVOPHED) Adult infusion Last Rate: Stopped (04/26/12 1100)  DISCONTD: heparin Last Rate: 950 Units/hr (04/25/12 2300)    Estimated Nutritional Needs:   Kcal: 1528  Protein:  77-93g Fluid: 1.5L  NUTRITION DIAGNOSIS: -Inadequate oral intake (NI-2.1).  Status: Ongoing -Suspect pt with at least some degree of moderate nutrition of acute illness AEB report of pt with poor appetite and weight loss, however pt with unreliable weight trend r/t CHF and no family present.   RELATED TO: inability to eat  AS EVIDENCE BY: mechanical ventilation, NPO  MONITORING/EVALUATION(Goals): Nutrition per family wishes   EDUCATION NEEDS: -Education not appropriate at this time  INTERVENTION: Family noted to be leaning towards comfort measures if pt fails to improve. If family desires aggressive nutrition, recommend TF of Oxepa start at 49ml/hr increase by 10ml q4hr to goal of 81ml/hr which will provide 1080 calories, 45g protein, free water. This will meet 70% energy needs r/t ASPEN guidelines for permissive enteral underfeeding in obese adults. If IVF d/c recommend water flushes q6hr. RD to monitor plan of care.   Dietitian #: [phone]   DOCUMENTATION CODES Per approved criteria  -Obesity Unspecified    Marshall Cork 04/26/2012, 1:22 PM

## 2012-04-26 NOTE — Progress Notes (Signed)
Chaplain responded to code page.  No family present.  Chaplain remained in area for 20 minutes to see if she was needed for support.  Rev. Audie Box [phone]

## 2012-04-26 NOTE — Procedures (Signed)
Central Venous Catheter Insertion Procedure Note Trevor [name] [id number] Oct 05, 1925  Procedure: Insertion of Central Venous Catheter Indications: Assessment of intravascular volume, Drug and/or fluid administration and Frequent blood sampling  Procedure Details Consent: Unable to obtain consent because of emergent medical necessity. Time Out: Verified patient identification, verified procedure, site/side was marked, verified correct patient position, special equipment/implants available, medications/allergies/relevent history reviewed, required imaging and test results available.  Performed  Maximum sterile technique was used including antiseptics, cap, gloves, gown, hand hygiene, mask and sheet. Skin prep: Chlorhexidine; local anesthetic administered A antimicrobial bonded/coated triple lumen catheter was placed in the left internal jugular vein using the Seldinger technique.  Evaluation Blood flow good Complications: No apparent complications Patient did tolerate procedure well. Chest X-ray ordered to verify placement.  CXR: normal.  Overton Mam, M.D. Pulmonary and Critical Care Medicine Call E-link with questions [phone] 04/26/2012, 2:41 AM

## 2012-04-27 ENCOUNTER — Inpatient Hospital Stay (HOSPITAL_COMMUNITY): Payer: Medicare Other

## 2012-04-27 LAB — CBC
MCH: 25.7 pg — ABNORMAL LOW (ref 26.0–34.0)
MCH: 25.1 pg — ABNORMAL LOW (ref 26.0–34.0)
MCHC: 33.8 g/dL (ref 30.0–36.0)
MCV: 76.2 fL — ABNORMAL LOW (ref 78.0–100.0)
MCV: 76 fL — ABNORMAL LOW (ref 78.0–100.0)
Platelets: 87 10*3/uL — ABNORMAL LOW (ref 150–400)
Platelets: 126 10*3/uL — ABNORMAL LOW (ref 150–400)
RBC: 4.3 MIL/uL (ref 4.22–5.81)
RDW: 18.4 % — ABNORMAL HIGH (ref 11.5–15.5)
WBC: 11.2 10*3/uL — ABNORMAL HIGH (ref 4.0–10.5)

## 2012-04-27 LAB — BASIC METABOLIC PANEL
BUN: 20 mg/dL (ref 6–23)
CO2: 24 mEq/L (ref 19–32)
CO2: 36 mEq/L — ABNORMAL HIGH (ref 19–32)
Calcium: 5.1 mg/dL — CL (ref 8.4–10.5)
Calcium: 8.4 mg/dL (ref 8.4–10.5)
Chloride: 114 mEq/L — ABNORMAL HIGH (ref 96–112)
Creatinine, Ser: 0.47 mg/dL — ABNORMAL LOW (ref 0.50–1.35)
Creatinine, Ser: 0.77 mg/dL (ref 0.50–1.35)
GFR calc non Af Amer: 81 mL/min — ABNORMAL LOW (ref >90)
Glucose, Bld: 166 mg/dL — ABNORMAL HIGH (ref 70–99)
Sodium: 139 mEq/L (ref 135–145)

## 2012-04-27 LAB — BLOOD GAS, ARTERIAL
Acid-Base Excess: 12.9 mmol/L — ABNORMAL HIGH (ref 0.0–2.0)
Bicarbonate: 37.3 mEq/L — ABNORMAL HIGH (ref 20.0–24.0)
Drawn by: 362741
FIO2: 0.6 %
O2 Saturation: 98.6 %
PEEP: 5 cmH2O
Patient temperature: 98.6
RATE: 16 resp/min

## 2012-04-27 LAB — POCT I-STAT 3, ART BLOOD GAS (G3+)
Acid-Base Excess: 11 mmol/L — ABNORMAL HIGH (ref 0.0–2.0)
Bicarbonate: 37.8 mEq/L — ABNORMAL HIGH (ref 20.0–24.0)
pH, Arterial: 7.396 (ref 7.350–7.450)

## 2012-04-27 MED ORDER — FUROSEMIDE 10 MG/ML IJ SOLN
40.0000 mg | Freq: Once | INTRAMUSCULAR | Status: AC
  Administered 2012-04-27: 40 mg via INTRAVENOUS
  Filled 2012-04-27: qty 4

## 2012-04-27 MED ORDER — POTASSIUM CHLORIDE 20 MEQ/15ML (10%) PO LIQD
40.0000 meq | Freq: Once | ORAL | Status: AC
  Administered 2012-04-27: 40 meq
  Filled 2012-04-27: qty 30

## 2012-04-27 MED ORDER — METHYLPREDNISOLONE SODIUM SUCC 40 MG IJ SOLR
40.0000 mg | INTRAMUSCULAR | Status: DC
  Administered 2012-04-28: 40 mg via INTRAVENOUS
  Filled 2012-04-27 (×2): qty 1

## 2012-04-27 MED ORDER — POTASSIUM CHLORIDE 10 MEQ/100ML IV SOLN
10.0000 meq | INTRAVENOUS | Status: AC
  Administered 2012-04-27 (×4): 10 meq via INTRAVENOUS
  Filled 2012-04-27: qty 400

## 2012-04-27 MED ORDER — OXEPA PO LIQD
1000.0000 mL | ORAL | Status: DC
  Administered 2012-04-27: 20 mL
  Filled 2012-04-27 (×3): qty 1000

## 2012-04-27 MED FILL — Medication: Qty: 1 | Status: AC

## 2012-04-27 NOTE — Progress Notes (Signed)
Patient ID: SHUAIB CORSINO, male   DOB: 31-Mar-1925, 78 y.o.   MRN: [id number]   Patient Name: Trevor [name] [facility] Date of Encounter: 04/27/2012    SUBJECTIVE  No significant change. Positive I/O yesterday. CVP is 6 and CXR shows pulmonary edema. Still intubated and on pressors. K low.  CURRENT MEDS    . a stronger pump book   Does not apply Once  . albuterol-ipratropium  6-8 puff Inhalation Q4H  . antiseptic oral rinse  15 mL Mouth Rinse BID  . aspirin EC  81 mg Oral Daily  . atorvastatin  20 mg Oral q1800  . calcium-vitamin D  1 tablet Oral BID  . chlorhexidine  15 mL Mouth/Throat BID  . clopidogrel  75 mg Oral Q breakfast  . feeding supplement  237 mL Oral BID BM  . ferrous sulfate  325 mg Oral Q breakfast  . heparin subcutaneous  5,000 Units Subcutaneous Q8H  . levothyroxine  25 mcg Oral QAC breakfast  . methylPREDNISolone (SOLU-MEDROL) injection  40 mg Intravenous Q24H  . pantoprazole (PROTONIX) IV  40 mg Intravenous QHS  . piperacillin-tazobactam (ZOSYN)  IV  3.375 g Intravenous Q8H  . potassium chloride  10 mEq Intravenous Q1 Hr x 4  . potassium chloride  40 mEq Per Tube Once  . sodium chloride  3 mL Intravenous Q12H  . Thrombi-Pad  1 each Topical Once  . DISCONTD: methylPREDNISolone (SOLU-MEDROL) injection  40 mg Intravenous Q12H  . DISCONTD: vancomycin  750 mg Intravenous Q12H    OBJECTIVE  Filed Vitals:   04/27/12 0745 04/27/12 0750 04/27/12 0830 04/27/12 0900  BP:      Pulse: 65 74 69 66  Temp:      TempSrc:      Resp:      Height:      Weight:      SpO2: 95% 95% 96% 96%    Intake/Output Summary (Last 24 hours) at 04/27/12 1030 Last data filed at 04/27/12 0700  Gross per 24 hour  Intake 683.63 ml  Output    417 ml  Net 266.63 ml   Filed Weights   04/25/12 0116 04/26/12 0500 04/27/12 0447  Weight: 166 lb 3.6 oz (75.4 kg) 170 lb 3.1 oz (77.2 kg) 173 lb 1 oz (78.5 kg)    PHYSICAL EXAM  General: Intubated Neuro: [name] Moves all extremities  spontaneously.[name] HEENT:  Normal  Neck: Supple without bruits or JVD. Lungs:  Resp on vent Heart: RRR no s3, s4, or murmurs. Abdomen: Soft, non-tender, non-distended, BS + x 4.  Extremities: No clubbing, cyanosis or edema. DP/PT/Radials 2+ and equal bilaterally.  Accessory Clinical Findings  CBC  Basename 04/27/12 0634 04/27/12 0500  WBC 11.2* 7.9  NEUTROABS -- --  HGB 10.8* 8.1*  HCT 32.7* 24.0*  MCV 76.0* 76.2*  PLT 126* 87*   Basic Metabolic Panel  Basename 04/27/12 0634 04/27/12 0500 04/25/12 2248  NA 139 145 --  K 2.9* 1.8* --  CL 97 114* --  CO2 36* 24 --  GLUCOSE 166* 112* --  BUN 31* 20 --  CREATININE 0.77 0.47* --  CALCIUM 8.4 5.1* --  MG -- -- 1.8  PHOS -- -- 3.7   Liver Function Tests  Basename 04/25/12 2248  AST 77*  ALT 43  ALKPHOS 127*  BILITOT 1.6*  PROT 8.2  ALBUMIN 2.6*   No results found for this basename: LIPASE:2,AMYLASE:2 in the last 72 hours Cardiac Enzymes  Basename 04/26/12 1448 04/26/12 0542 04/25/12  2248  CKTOTAL 54 59 46  CKMB 2.5 3.1 3.4  CKMBINDEX -- -- --  TROPONINI <0.30 <0.30 <0.30   BNP No components found with this basename: POCBNP:3 D-Dimer No results found for this basename: DDIMER:2 in the last 72 hours Hemoglobin A1C No results found for this basename: HGBA1C in the last 72 hours Fasting Lipid Panel No results found for this basename: CHOL,HDL,LDLCALC,TRIG,CHOLHDL,LDLDIRECT in the last 72 hours Thyroid Function Tests No results found for this basename: TSH,T4TOTAL,FREET3,T3FREE,THYROIDAB in the last 72 hours  TELE NSR  ECG    Radiology/Studies  Dg Chest 2 View  04/24/2012  *RADIOLOGY REPORT*  Clinical Data: Weakness, shortness of breath, CHF.  CHEST - 2 VIEW  Comparison: 08/10/2011  Findings: Cardiomegaly with vascular congestion.  Low lung volumes with bibasilar atelectasis and small effusions.  No overt edema. No acute bony abnormality.  IMPRESSION: Cardiomegaly, vascular congestion.  Low lung volumes,  bibasilar atelectasis.  Small effusions.  Original Report Authenticated By: Cyndie Chime, M.D.   Ct Head Wo Contrast  04/23/2012  *RADIOLOGY REPORT*  Clinical Data: Lethargic.  Questionable head trauma.  Rule out bleed.  CT HEAD WITHOUT CONTRAST  Technique:  Contiguous axial images were obtained from the base of the skull through the vertex without contrast.  Comparison: None.  Findings: Motion degraded exam.  No intracranial hemorrhage.  No CT evidence of large acute infarct.  Small vessel disease type changes.  Global atrophy without hydrocephalus.  Vascular calcifications.  No intracranial mass lesion detected on this unenhanced exam.  Minimal mucosal thickening maxillary sinuses.  IMPRESSION: No intracranial hemorrhage.  Please see above.  Original Report Authenticated By: Fuller Canada, M.D.   Dg Chest Port 1 View  04/27/2012  *RADIOLOGY REPORT*  Clinical Data: Evaluate endotracheal tube positioning  PORTABLE CHEST - 1 VIEW  Comparison: 04/26/2012; 04/25/2012; 04/24/2012  Findings: Grossly unchanged enlarged cardiac silhouette and mediastinal contours.  Stable positioning of support apparatus. Lung volumes remain persistently reduced with small bilateral effusions.  Pulmonary vasculature is indistinct with cephalization of flow.  No definite pneumothorax.  Unchanged bones.  IMPRESSION: 1.  Stable positioning of support apparatus.  No pneumothorax. 2.  Persistently reduced lung volumes with stable findings of pulmonary edema, bilateral effusions and bibasilar opacities, atelectasis versus infiltrate.  Original Report Authenticated By: Waynard Reeds, M.D.   Dg Chest Port 1 View  04/26/2012  *RADIOLOGY REPORT*  Clinical Data: Evaluate fluid status  PORTABLE CHEST - 1 VIEW  Comparison: 04/25/2012  Findings: There is an ET tube with tip above the carina.  Left IJ catheter is identified with tip in the SVC.  Cardiac enlargement is stable.  There are low lung volumes, bilateral pleural effusions and  pulmonary edema.  Compared with previous exam the interstitial edema has improved.  Similar appearance of the left rib fractures.  IMPRESSION:  1.  Slight improvement in interstitial edema pattern.  Original Report Authenticated By: Rosealee Albee, M.D.   Dg Chest Port 1 View  04/25/2012  *RADIOLOGY REPORT*  Clinical Data: Intubation and central venous catheter placement.  PORTABLE CHEST - 1 VIEW 04/25/2012 2148 hours:  Comparison: Portable chest x-ray earlier same date 2039 hours.  Findings: Endotracheal tube tip still somewhat low, approximately 2 cm above the carina.  New left jugular central venous catheter tip in the SVC.  No evidence of pneumothorax or mediastinal hematoma. Cardiac silhouette markedly enlarged but stable.  Interstitial and airspace pulmonary edema and bilateral pleural effusions, unchanged.  Associated passive atelectasis in  the lower lobes, left greater than right, unchanged.  No new pulmonary parenchymal abnormality.  IMPRESSION:  1.  Left jugular central venous catheter tip in the SVC.  No acute complicating features. 2.  Endotracheal tube tip still low, approximately 2 cm above the carina.  This should be withdrawn at least another 2-3 cm. 3.  Stable CHF and bilateral pleural effusions with associated passive atelectasis in the lower lobes.  Original Report Authenticated By: Arnell Sieving, M.D.   Dg Chest Port 1 View  04/25/2012  *RADIOLOGY REPORT*  Clinical Data: Respiratory arrest  PORTABLE CHEST - 1 VIEW  Comparison: Chest radiograph 04/24/2012  Findings: Patient rotated rightward.  The endotracheal tube appears low measuring 11 mm from carina.  Stable enlarged cardiac silhouette.  There is bilateral perihilar air space disease which increased from prior.  Persistent left basilar atelectasis.  There is small bilateral pleural effusions.  IMPRESSION:  1.  Endotracheal tube appears low although somewhat difficult to define the carina on this rotated exam.  Consider retraction  by 3 to 4 cm. 2.  Perihilar air space disease is increased. 3.  Bilateral pleural effusions basilar atelectasis.  Findings discussed with respiratory therapist Pat on 04/25/2012 and 20 45  hours  Original Report Authenticated By: Genevive Bi, M.D.    ASSESSMENT AND PLAN  Principal Problem:  *Lethargy Active Problems:  Ischemic heart disease  Congestive heart failure with left ventricular systolic dysfunction  COPD (chronic obstructive pulmonary disease)  Atrial fibrillation  Respiratory failure with hypercapnia  Acute respiratory failure with hypoxia  Shock    No significant change from yesterday. Urine output down. Renal function stable. CXR with edema. Will give 40mg  IV Lasix and aggressively replace KCL.  Signed, Valera Castle MD

## 2012-04-27 NOTE — Progress Notes (Addendum)
Name: Trevor [name] MRN: [id number] DOB: 26-Mar-1925    LOS: 4  PULMONARY / CRITICAL CARE MEDICINE   HPI:   78 year old male with PMH relevant for CAD, systolic CHF, Vfib, HTN, COPD Admitted initially to the Cardiology service for management of acute decompensated CHF. He was also started on heparin drip for new A. Fib. He was lethargic at admission and there was concern for hypothyroidism but TSH was normal. CT scan of the head did not show acute intracranial process. He had been treated since admission with IV lasix. Today he developed acute hypercarbic and hypoxemic respiratory failure and was initially given a trial of BiPAP. Later on a code blue was called. He developed Vtach requiring CPR for a brief period of time with return to spontaneous circulation. He was intubated and started on mechanical ventilation and was transferred to the ICU. At the time of my exam in the ICU the patient is intubated, grimacing, moving all four extremities. The monitor shows severe hypotension (50/30) and HR of 55. He is saturating 100% on 100% FiO2. We started Dopamine via peripheral line since there was no central access.  Subjective: Awake and alert, thick secretions   Vital Signs: Temp:  [98.9 F (37.2 C)-99.5 F (37.5 C)] 98.9 F (37.2 C) (07/07 0400) Pulse Rate:  [62-84] 66  (07/07 0900) Resp:  [18] 18  (07/07 0730) BP: (119-138)/(55-62) 138/62 mmHg (07/07 0000) SpO2:  [94 %-99 %] 96 % (07/07 0900) FiO2 (%):  [40 %-60 %] 40 % (07/07 0714) Weight:  [78.5 kg (173 lb 1 oz)] 78.5 kg (173 lb 1 oz) (07/07 0447)  Physical Examination: Gen: awake, alert on vent, comfortable HEENT: NCAT, PERRL, EOMi, ETT in place PULM: crackles in bases bilaterally CV: RRR, no mgr, no JVD AB: BS+, soft, nontender, no hsm Ext: warm, scd's Derm: no rash or skin breakdown Neuro: awake and alert, moves all four ext, follows commands, interactive    Principal Problem:  *Lethargy Active Problems:  Ischemic heart  disease  Congestive heart failure with left ventricular systolic dysfunction  COPD (chronic obstructive pulmonary disease)  Atrial fibrillation  Respiratory failure with hypercapnia  Acute respiratory failure with hypoxia  Shock   ASSESSMENT AND PLAN 78 y/o male with CHF, CAD and likely COPD admitted 7/3 with lethargy but developed respiratory failure on 7/5 leading to PEA arrest and VTach. He was intubated and found to be markedly hypotensive afterwards requiring vasopressors.  DDx includes septic shock from pneumonia vs. Cardiogenic shock. 7/5 decompensation likely respiratory due to pneumonia with some septic shock.   PULMONARY  Lab 04/27/12 0734 04/27/12 0400 04/26/12 0800 04/26/12 0455 04/25/12 2239 04/25/12 1926  PHART 7.396 7.485* -- 7.452* 7.571* 7.345*  PCO2ART 61.5* 50.0* -- 56.7* 45.2* 79.1*  PO2ART 71.0* 102.0* -- 111.0* 119.0* 53.1*  HCO3 37.8* 37.3* -- 39.1* 41.6* 42.0*  O2SAT 93.0 98.6 93.7 98.7 99.0 --   Ventilator Settings: Vent Mode:  [-] PSV;CPAP FiO2 (%):  [40 %-60 %] 40 % Set Rate:  [16 bmp] 16 bmp Vt Set:  [450 mL] 450 mL PEEP:  [5 cmH20] 5 cmH20 Pressure Support:  [10 cmH20] 10 cmH20 Plateau Pressure:  [16 cmH20-20 cmH20] 16 cmH20 CXR:  Bilateral lung infiltrates, unchanged. ETT:  In position  A:   1) Acute hypercarbic and hypoxemic respiratory failure 2) COPD with evidence of chronic hypercarbia. 3) Pneumonia: aspiration vs. HCAP  P:   - Intubated on mechanical ventilation - PRVC, Vt 8cc/kg, RR: 16, PEEP: 5, FiO2  to keep O2 sat >92% - VAP prevention order set - see ID - Passing SBT this morning, but thick secretions, still on pressors, see code status below - Combivent q4 hrs - wean Solumedrol  CARDIOVASCULAR  Lab 04/26/12 1448 04/26/12 0542 04/25/12 2248 04/24/12 0435 04/23/12 2236 04/23/12 1626  TROPONINI <0.30 <0.30 <0.30 <0.30 <0.30 --  LATICACIDVEN -- -- 2.0 -- -- --  PROBNP -- -- 14991.0* -- -- 4908.0*   ECG:  Sinus rhythm Lines:  Left IJ 04/25/12 >>  A:  1) Profound hypotension, likely mixed cardiogenic and septic shock (7/7 Coox 94%)  2) Repeat BNP 14991 favors cardiogenic shock but sepsis cannot be ruled out. Bedside echocardiogram with 25% LVEF. Chest X ray with bilateral diffuse infiltrates P:  - Currently on dopamine and norepinephrine, improving requirments - Will follow cardiac enzymes - would like to diurese, attempt if pressor requirement decreases - 2D TTE per cardiology today? - CVP 6, but pressor requirement decreased, hold IVF give cardiogenic shock - Keep intubated until shock improves, secretions improve  RENAL  Lab 04/27/12 0634 04/27/12 0500 04/26/12 0500 04/25/12 2248 04/25/12 1736 04/23/12 1626  NA 139 145 139 139 138 --  K 2.9* 1.8* -- -- -- --  CL 97 114* 96 94* 94* --  CO2 36* 24 38* 35* 37* --  BUN 31* 20 27* 25* 21 --  CREATININE 0.77 0.47* 1.02 1.18 1.09 --  CALCIUM 8.4 5.1* 8.9 9.3 9.4 --  MG -- -- -- 1.8 -- 2.5  PHOS -- -- -- 3.7 -- --   Intake/Output      07/06 0701 - 07/07 0700 07/07 0701 - 07/08 0700   I.V. (mL/kg) 548.6 (7)    IV Piggyback 300    Total Intake(mL/kg) 848.6 (10.8)    Urine (mL/kg/hr) 417 (0.2)    Total Output 417    Net +431.6          Foley:  Placed 04/25/12  A:   Creatinine stable P:   - Will hold diuretics until shock better understood - Monitor BMP  GASTROINTESTINAL  Lab 04/25/12 2248 04/23/12 1626  AST 77* 61*  ALT 43 34  ALKPHOS 127* 129*  BILITOT 1.6* 1.1  PROT 8.2 7.8  ALBUMIN 2.6* 2.6*    A:   No issues P:   - GI prophylaxis with protonix - Start tube feeds today  HEMATOLOGIC  Lab 04/27/12 0634 04/27/12 0500 04/26/12 0500 04/25/12 2248 04/25/12 0415 04/23/12 2124 04/23/12 1626  HGB 10.8* 8.1* 10.9* 11.4* 11.2* -- --  HCT 32.7* 24.0* 33.9* 35.2* 34.5* -- --  PLT 126* 87* 142* 144* 307 -- --  INR -- -- -- -- -- 1.30 1.30  APTT -- -- -- -- -- -- 36   A:  1) Anemia stable     INFECTIOUS  Lab 04/27/12 0634 04/27/12 0500  04/26/12 0500 04/25/12 2248 04/25/12 0415  WBC 11.2* 7.9 13.2* 13.3* 8.0  PROCALCITON -- -- -- 0.16 --   Cultures: Blood, tracheal and urine cultures sent.  Antibiotics: - Zosyn (04/25/12) - Vancomycin (04/25/12)  >> 7/7  A:  1) Shock, cardiogenic vs septic 2) Pneumonia: HCAP vs. aspiration P:   - Will follow cultures. - d/c vanc   ENDOCRINE  Lab 04/25/12 2339 04/23/12 1611  GLUCAP 154* 98   A:   No history of diabetes   NEUROLOGIC  A:   Following commands P:   - Intermittent sedation with Versed and fentanyl, titrate to RASS -2  BEST  PRACTICE / DISPOSITION - Level of Care:  ICU - Primary Service:  Cardiology - Consultants:  CCM - Code Status:  DNR - Diet:  Start tube feeds today - DVT Px:  Heparin drip - GI Px:  Protonix - Skin Integrity:  Intact. - Social / Family:  Family updated by CCM on 7/7 PM, requested full support, but DNR (no CPR).  Updated 7/7, he really doesn't want to be reintubated.  Passing SBT but still with thick secretions, will try to optimize volume status and secretions prior to extubation (7/8?)   CC time 40 minutes.  Yolonda Kida PCCM Pager: [phone] Cell: [phone] If no response, call [phone]  04/27/2012, 9:25 AM

## 2012-04-27 NOTE — Progress Notes (Signed)
Pharmacist Heart Failure Core Measure Documentation  Assessment: Trevor [name] has an EF documented as 25-30% on 04/24/12 by ECHO.  Rationale: Heart failure patients with left ventricular systolic dysfunction (LVSD) and an EF < 40% should be prescribed an angiotensin converting enzyme inhibitor (ACEI) or angiotensin receptor blocker (ARB) at discharge unless a contraindication is documented in the medical record.  This patient is not currently on an ACEI or ARB for HF.  This note is being placed in the record in order to provide documentation that a contraindication to the use of these agents is present for this encounter.  ACE Inhibitor or Angiotensin Receptor Blocker is contraindicated (specify all that apply)  []   ACEI allergy AND ARB allergy []   Angioedema []   Moderate or severe aortic stenosis []   Hyperkalemia []   Hypotension []   Renal artery stenosis [x]   Worsening renal function, preexisting renal disease or dysfunction   [name] 04/27/2012 10:06 AM

## 2012-04-28 ENCOUNTER — Inpatient Hospital Stay (HOSPITAL_COMMUNITY): Payer: Medicare Other

## 2012-04-28 LAB — BLOOD GAS, ARTERIAL
Acid-Base Excess: 11.7 mmol/L — ABNORMAL HIGH (ref 0.0–2.0)
Bicarbonate: 42 mEq/L — ABNORMAL HIGH (ref 20.0–24.0)
Bicarbonate: 35.8 mEq/L — ABNORMAL HIGH (ref 20.0–24.0)
FIO2: 0.4 %
MECHVT: 450 mL
O2 Saturation: 98.9 %
Patient temperature: 98.6
Patient temperature: 98.6
TCO2: 44.5 mmol/L (ref 0–100)
TCO2: 37.3 mmol/L (ref 0–100)
pCO2 arterial: 79.1 mmHg (ref 35.0–45.0)
pH, Arterial: 7.345 — ABNORMAL LOW (ref 7.350–7.450)

## 2012-04-28 LAB — BASIC METABOLIC PANEL
Chloride: 101 mEq/L (ref 96–112)
GFR calc Af Amer: >90 mL/min (ref >90)
GFR calc non Af Amer: 84 mL/min — ABNORMAL LOW (ref >90)
Potassium: 3.6 mEq/L (ref 3.5–5.1)
Sodium: 141 mEq/L (ref 135–145)

## 2012-04-28 LAB — POCT I-STAT 3, ART BLOOD GAS (G3+)
Acid-Base Excess: 12 mmol/L — ABNORMAL HIGH (ref 0.0–2.0)
O2 Saturation: 98 %
TCO2: 40 mmol/L (ref 0–100)
pCO2 arterial: 55.4 mmHg — ABNORMAL HIGH (ref 35.0–45.0)

## 2012-04-28 LAB — GLUCOSE, CAPILLARY
Glucose-Capillary: 114 mg/dL — ABNORMAL HIGH (ref 70–99)
Glucose-Capillary: 107 mg/dL — ABNORMAL HIGH (ref 70–99)

## 2012-04-28 MED ORDER — METHYLPREDNISOLONE SODIUM SUCC 40 MG IJ SOLR
20.0000 mg | INTRAMUSCULAR | Status: DC
  Administered 2012-04-29: 20 mg via INTRAVENOUS
  Filled 2012-04-28 (×2): qty 0.5

## 2012-04-28 MED ORDER — PANTOPRAZOLE SODIUM 40 MG PO TBEC
40.0000 mg | DELAYED_RELEASE_TABLET | Freq: Every day | ORAL | Status: DC
  Administered 2012-04-28 – 2012-05-05 (×8): 40 mg via ORAL
  Filled 2012-04-28 (×8): qty 1

## 2012-04-28 MED ORDER — PRO-STAT SUGAR FREE PO LIQD
30.0000 mL | Freq: Four times a day (QID) | ORAL | Status: DC
  Administered 2012-04-29 – 2012-05-01 (×5): 30 mL
  Filled 2012-04-28 (×19): qty 30

## 2012-04-28 MED ORDER — PANTOPRAZOLE SODIUM 40 MG PO PACK
40.0000 mg | PACK | Freq: Every day | ORAL | Status: DC
  Filled 2012-04-28: qty 20

## 2012-04-28 NOTE — Progress Notes (Signed)
Nutrition Follow-up/consult for TF initiation and management   Intervention:   1. Continue Oxepa with goal rate of 30 ml/hr.  2. Add 30 ml Pro-stat 4 times daily via OG tube  This will provide 1480 kcal, 105 gm protein, and 565 ml free water 3. If IV fluids d/c'd pt will needs additional free water to maintain hydration, recommend 150 ml q 4 hours 4. RD will continue to follow     Assessment:   Pt remains intubated and on pressors. Pt is alert but continues to have thick secreations. Was diuresing prior to intubation, will continue once pressors are decreased.  Per notes possible extubation today.    Diet Order:  NPO Current TF: Oxepa @ 30 ml/hr providing 1080 kcal, 45 gm protein, and 565 ml free water.  Meds: Scheduled Meds:   . a stronger pump book   Does not apply Once  . albuterol-ipratropium  6-8 puff Inhalation Q4H  . antiseptic oral rinse  15 mL Mouth Rinse BID  . aspirin EC  81 mg Oral Daily  . atorvastatin  20 mg Oral q1800  . calcium-vitamin D  1 tablet Oral BID  . chlorhexidine  15 mL Mouth/Throat BID  . clopidogrel  75 mg Oral Q breakfast  . ferrous sulfate  325 mg Oral Q breakfast  . furosemide  40 mg Intravenous Once  . heparin subcutaneous  5,000 Units Subcutaneous Q8H  . levothyroxine  25 mcg Oral QAC breakfast  . methylPREDNISolone (SOLU-MEDROL) injection  40 mg Intravenous Q24H  . pantoprazole (PROTONIX) IV  40 mg Intravenous QHS  . piperacillin-tazobactam (ZOSYN)  IV  3.375 g Intravenous Q8H  . potassium chloride  10 mEq Intravenous Q1 Hr x 4  . potassium chloride  40 mEq Per Tube Once  . sodium chloride  3 mL Intravenous Q12H  . DISCONTD: feeding supplement  237 mL Oral BID BM  . DISCONTD: methylPREDNISolone (SOLU-MEDROL) injection  40 mg Intravenous Q12H  . DISCONTD: vancomycin  750 mg Intravenous Q12H   Continuous Infusions:   . DOPamine 8 mcg/kg/min (04/28/12 0009)  . feeding supplement (OXEPA) 20 mL (04/27/12 1419)  . norepinephrine (LEVOPHED)  Adult infusion Stopped (04/26/12 1100)   PRN Meds:.sodium chloride, sodium chloride, acetaminophen, fentaNYL, midazolam, ondansetron (ZOFRAN) IV, sodium chloride  Labs:  CMP     Component Value Date/Time   NA 141 04/28/2012 0455   K 3.6 04/28/2012 0455   CL 101 04/28/2012 0455   CO2 34* 04/28/2012 0455   GLUCOSE 128* 04/28/2012 0455   BUN 30* 04/28/2012 0455   CREATININE 0.70 04/28/2012 0455   CALCIUM 8.1* 04/28/2012 0455   PROT 8.2 04/25/2012 2248   ALBUMIN 2.6* 04/25/2012 2248   AST 77* 04/25/2012 2248   ALT 43 04/25/2012 2248   ALKPHOS 127* 04/25/2012 2248   BILITOT 1.6* 04/25/2012 2248   GFRNONAA 84* 04/28/2012 0455   GFRAA >90 04/28/2012 0455     Intake/Output Summary (Last 24 hours) at 04/28/12 0856 Last data filed at 04/28/12 0600  Gross per 24 hour  Intake 1950.91 ml  Output   1600 ml  Net 350.91 ml    Weight Status:  162 lbs, remains above admission weight Admission Weight: 156 lbs (71.2 kg) At pt's current weight, BMI no longer qualifies for permissive underfeeding per ASPEN guidelines  Body mass index is 29.64 kg/(m^2). Pt is overweight.   Ve: 8 Tmax: 36.9 C  Re-estimated needs:  1456 kcal, 110-125 gm protein  Nutrition Dx:  Inadequate oral intake r/t  inability to eat AEB NPO with mechanical ventilation.   Goal:  Enteral nutrition to provide 60-70% of estimated calorie needs (22-25 kcals/kg ideal body weight) and 100% of estimated protein needs, based on ASPEN guidelines for permissive underfeeding in critically ill obese individuals. --no longer applicable   New Goal: EN to meet >90% of estimated kcal and protein needs.    Monitor:  Vent/extubation, weight, labs, I/O's   Clarene Duke RD, LDN Pager [phone] After Hours pager [phone]

## 2012-04-28 NOTE — Procedures (Signed)
Extubation Procedure Note  Patient Details:   Name: Trevor [name] DOB: 1925-07-28 MRN: [id number]   Airway Documentation:     Evaluation  O2 sats: stable throughout Complications: No apparent complications Patient did tolerate procedure well. Bilateral Breath Sounds: Rhonchi Suctioning: Airway Yes. Pt extubated at 1:10 and placed on 4L [place].  Pt was able to speak Pulse(82) and O2sats (96%) remain stable.   Myana Schlup V 04/28/2012, 1:13 PM

## 2012-04-28 NOTE — Progress Notes (Signed)
ANTIBIOTIC CONSULT NOTE - FOLLOW UP  Pharmacy Consult for Zosyn Indication: PNA: aspiration vs HCAP  Allergies  Allergen Reactions  . Morphine And Related Anaphylaxis    Cardiac arrest  . Other Other (See Comments)    ALL SEDATIVES;  Lethargic, can hardly function, etc.  . Lisinopril Cough  . Vioxx (Rofecoxib) Other (See Comments)    Lethargic, can hardly function, very hard to wake up   Vital Signs: Temp: 98.4 F (36.9 C) (07/08 0745) Temp src: Oral (07/08 0745) BP: 93/43 mmHg (07/08 1000) Pulse Rate: 65  (07/08 1000) Intake/Output from previous day: 07/07 0701 - 07/08 0700 In: 2037.6 [I.V.:778.6; NG/GT:720; IV Piggyback:539] Out: 1600 [Urine:1600] Intake/Output from this shift: Total I/O In: 154.8 [I.V.:64.8; NG/GT:90] Out: -   Labs:  Basename 04/28/12 0455 04/27/12 0634 04/27/12 0500 04/26/12 0500  WBC -- 11.2* 7.9 13.2*  HGB -- 10.8* 8.1* 10.9*  PLT -- 126* 87* 142*  LABCREA -- -- -- --  CREATININE 0.70 0.77 0.47* --   Estimated Creatinine Clearance: 60.5 ml/min (by C-G formula based on Cr of 0.7).  Assessment: 86yom continues on Zosyn D#3 for pneumonia. Renal function is stable and dose appropriate.  Noted plan to narrow in the morning if cultures remain negative.  Vancomycin 7/6 >>7/7 Zosyn 7/6 >   7/5 BCx x2>>NGTD 7/5 MRSA PCR negative  Goal of Therapy:  Appropriate Zosyn dosing  Plan:  1) Continue Zosyn 3.375g IV q8 2) Follow up culture data  Fredrik Rigger 04/28/2012,10:28 AM

## 2012-04-28 NOTE — Progress Notes (Signed)
Name: Trevor [name] MRN: [id number] DOB: [birth year]    LOS: 5  PULMONARY / CRITICAL CARE MEDICINE   HPI:   78 year old male with PMH relevant for CAD, systolic CHF, Vfib, HTN, COPD Admitted initially to the Cardiology service for management of acute decompensated CHF. He was also started on heparin drip for new A. Fib.  code blue was called. He developed Vtach requiring CPR for a brief period of time with return to spontaneous circulation. He was intubated.  Subjective: Awake, cooperative, remains on pressors   Vital Signs: Temp:  [97.8 F (36.6 C)-98.4 F (36.9 C)] 98.4 F (36.9 C) (07/08 0745) Pulse Rate:  [58-83] 70  (07/08 0800) Resp:  [16] 16  (07/08 0715) BP: (92-104)/(52-56) 98/52 mmHg (07/08 0745) SpO2:  [95 %-100 %] 97 % (07/08 0800) FiO2 (%):  [40 %] 40 % (07/08 0715) Weight:  [73.5 kg (162 lb 0.6 oz)] 73.5 kg (162 lb 0.6 oz) (07/08 0410)  Physical Examination: Gen: rass 0  HEENT: NCAT, jvd,  ETT in place PULM: coarse bilateral CV: RRR, no mgr, no JVD AB: BS+, soft, nontender, no hsm Ext: warm, scd's Derm: no rash or skin breakdown Neuro: awake and alert, moves all four ext, follows commands, interactive, rass at goal   Principal Problem:  *Lethargy Active Problems:  Ischemic heart disease  Congestive heart failure with left ventricular systolic dysfunction  COPD (chronic obstructive pulmonary disease)  Atrial fibrillation  Respiratory failure with hypercapnia  Acute respiratory failure with hypoxia  Shock   ASSESSMENT AND PLAN 78 y/o male with CHF, CAD and likely COPD admitted 7/3 with lethargy but developed respiratory failure on 7/5 leading to PEA arrest and VTach. He was intubated and found to be markedly hypotensive afterwards requiring vasopressors.  DDx includes septic shock from pneumonia vs. Cardiogenic shock. 7/5 decompensation likely respiratory due to pneumonia with some septic shock.   PULMONARY  Lab 04/28/12 0333 04/27/12 0734 04/27/12  0400 04/26/12 0800 04/26/12 0455 04/25/12 2239  PHART 7.491* 7.396 7.485* -- 7.452* 7.571*  PCO2ART 47.3* 61.5* 50.0* -- 56.7* 45.2*  PO2ART 91.8 71.0* 102.0* -- 111.0* 119.0*  HCO3 35.8* 37.8* 37.3* -- 39.1* 41.6*  O2SAT 98.9 93.0 98.6 93.7 98.7 --   Ventilator Settings: Vent Mode:  [-] PRVC;CPAP;PSV FiO2 (%):  [40 %] 40 % Set Rate:  [16 bmp] 16 bmp Vt Set:  [450 mL] 450 mL PEEP:  [5 cmH20] 5 cmH20 Pressure Support:  [5 cmH20] 5 cmH20 Plateau Pressure:  [18 cmH20-21 cmH20] 21 cmH20 CXR:  Bilateral lung infiltrates, unchanged. ETT:  In position  A:   1) Acute hypercarbic and hypoxemic respiratory failure 2) COPD with evidence of chronic hypercarbia. 3) Pneumonia: aspiration vs. HCAP  P:   - Abg reviewed, reduce rate -weaning this am cpap 5 ps 5, assess rsbi, secretions status , strength abg in 2 hrs -repeat pcxr in am  - Combivent q4 hrs - wean Solumedrol to 20 mg, he moves air very well  CARDIOVASCULAR  Lab 04/26/12 1448 04/26/12 0542 04/25/12 2248 04/24/12 0435 04/23/12 2236 04/23/12 1626  TROPONINI <0.30 <0.30 <0.30 <0.30 <0.30 --  LATICACIDVEN -- -- 2.0 -- -- --  PROBNP -- -- 14991.0* -- -- 4908.0*   ECG:  Sinus rhythm Lines: Left IJ 04/25/12 >>  A:  Shock, likely multifactorial  Bedside echocardiogram with 25% LVEF. Chest X ray with bilateral diffuse infiltrates P:  - Currently on dopamine, consider accepting lower MAp goals - Will follow cardiac enzymes - neg -  avoid pos balance, lasix consideration, pending dopamine needs Steroids to remains as well, at risk rel AI  RENAL  Lab 04/28/12 0455 04/27/12 0634 04/27/12 0500 04/26/12 0500 04/25/12 2248 04/23/12 1626  NA 141 139 145 139 139 --  K 3.6 2.9* -- -- -- --  CL 101 97 114* 96 94* --  CO2 34* 36* 24 38* 35* --  BUN 30* 31* 20 27* 25* --  CREATININE 0.70 0.77 0.47* 1.02 1.18 --  CALCIUM 8.1* 8.4 5.1* 8.9 9.3 --  MG -- -- -- -- 1.8 2.5  PHOS -- -- -- -- 3.7 --   Intake/Output      07/07 0701 - 07/08  0700 07/08 0701 - 07/09 0700   I.V. (mL/kg) 757.3 (10.3)    NG/GT 690    IV Piggyback 539    Total Intake(mL/kg) 1986.3 (27)    Urine (mL/kg/hr) 1600 (0.9)    Total Output 1600    Net +386.3         Urine Occurrence 1 x     Foley:  Placed 04/25/12  A:   Creatinine improved P:   - hope dop to off, then lasix? - Monitor BMP  GASTROINTESTINAL  Lab 04/25/12 2248 04/23/12 1626  AST 77* 61*  ALT 43 34  ALKPHOS 127* 129*  BILITOT 1.6* 1.1  PROT 8.2 7.8  ALBUMIN 2.6* 2.6*    A:  TF P:   - GI prophylaxis with protonix - may need to hold tube feeds today for weaning?  HEMATOLOGIC  Lab 04/27/12 0634 04/27/12 0500 04/26/12 0500 04/25/12 2248 04/25/12 0415 04/23/12 2124 04/23/12 1626  HGB 10.8* 8.1* 10.9* 11.4* 11.2* -- --  HCT 32.7* 24.0* 33.9* 35.2* 34.5* -- --  PLT 126* 87* 142* 144* 307 -- --  INR -- -- -- -- -- 1.30 1.30  APTT -- -- -- -- -- -- 36   A:  1) Anemia stable   INFECTIOUS  Lab 04/27/12 0634 04/27/12 0500 04/26/12 0500 04/25/12 2248 04/25/12 0415  WBC 11.2* 7.9 13.2* 13.3* 8.0  PROCALCITON -- -- -- 0.16 --   Cultures: Blood, tracheal and urine cultures sent.  Antibiotics: - Zosyn (04/25/12) - Vancomycin (04/25/12)  >> 7/7  A:  1) Shock, cardiogenic vs septic (doubt) 2) Pneumonia: HCAP vs. aspiration P:   - Will follow cultures. - d/c vanc Continue zosyn, will  Narrow in am if remains neg  ENDOCRINE  Lab 04/28/12 0755 04/28/12 0420 04/28/12 0005 04/27/12 2009 04/25/12 2339  GLUCAP 107* 114* 153* 132* 154*   A:   No history of diabetes Controlled on own  NEUROLOGIC  A:   Following commands P:   - Intermittent sedation with Versed and fentanyl, titrate to RASS 0 Chair position if able  BEST PRACTICE / DISPOSITION - Level of Care:  ICU - Primary Service:  Cardiology - Consultants:  CCM - Code Status:  DNR - Diet:  Start tube feeds today - DVT Px:  Heparin drip - GI Px:  Protonix - Skin Integrity:  Intact. - Social / Family:  Family  updated by CCM on 7/7 PM, requested full support, but DNR (no CPR).  IF extubation planned, will contact wife CC time 30 minutes.  Mcarthur Rossetti. Tyson Alias, MD, FACP Pgr: [phone] [facility]

## 2012-04-28 NOTE — Progress Notes (Signed)
Subjective:  Patient is still on vent, but is awake and alert. Wean in progress per CCM. Still hypotension on dopamine. Rhythm is NSR.  Objective:  Vital Signs in the last 24 hours: Temp:  [97.8 F (36.6 C)-98.4 F (36.9 C)] 98.4 F (36.9 C) (07/08 0745) Pulse Rate:  [58-83] 70  (07/08 0800) Resp:  [16] 16  (07/08 0715) BP: (92-104)/(52-56) 98/52 mmHg (07/08 0745) SpO2:  [95 %-100 %] 97 % (07/08 0800) FiO2 (%):  [40 %] 40 % (07/08 0715) Weight:  [162 lb 0.6 oz (73.5 kg)] 162 lb 0.6 oz (73.5 kg) (07/08 0410)  Intake/Output from previous day: 07/07 0701 - 07/08 0700 In: 1986.3 [I.V.:757.3; NG/GT:690; IV Piggyback:539] Out: 1600 [Urine:1600] Intake/Output from this shift:       . a stronger pump book   Does not apply Once  . albuterol-ipratropium  6-8 puff Inhalation Q4H  . antiseptic oral rinse  15 mL Mouth Rinse BID  . aspirin EC  81 mg Oral Daily  . atorvastatin  20 mg Oral q1800  . calcium-vitamin D  1 tablet Oral BID  . chlorhexidine  15 mL Mouth/Throat BID  . clopidogrel  75 mg Oral Q breakfast  . ferrous sulfate  325 mg Oral Q breakfast  . furosemide  40 mg Intravenous Once  . heparin subcutaneous  5,000 Units Subcutaneous Q8H  . levothyroxine  25 mcg Oral QAC breakfast  . methylPREDNISolone (SOLU-MEDROL) injection  40 mg Intravenous Q24H  . pantoprazole (PROTONIX) IV  40 mg Intravenous QHS  . piperacillin-tazobactam (ZOSYN)  IV  3.375 g Intravenous Q8H  . potassium chloride  10 mEq Intravenous Q1 Hr x 4  . potassium chloride  40 mEq Per Tube Once  . sodium chloride  3 mL Intravenous Q12H  . DISCONTD: feeding supplement  237 mL Oral BID BM  . DISCONTD: methylPREDNISolone (SOLU-MEDROL) injection  40 mg Intravenous Q12H  . DISCONTD: vancomycin  750 mg Intravenous Q12H      . DOPamine 8 mcg/kg/min (04/28/12 0009)  . feeding supplement (OXEPA) 20 mL (04/27/12 1419)  . norepinephrine (LEVOPHED) Adult infusion Stopped (04/26/12 1100)    Physical Exam: The  patient appears to be in no distress. Awake on vent. Complains of throat soreness.  Head and neck exam reveals that the pupils are equal and reactive.  The extraocular movements are full.  There is no scleral icterus.  Mouth and pharynx are benign.  No lymphadenopathy.  No carotid bruits.  The jugular venous pressure is normal.  Thyroid is not enlarged or tender.  Chest is clear anteriorly.  Heart reveals no abnormal lift or heave.  First and second heart sounds are normal.  There is no murmur gallop rub or click.  The abdomen is soft and nontender.  Bowel sounds are normoactive.  There is no hepatosplenomegaly or mass.  There are no abdominal bruits.  Extremities reveal no phlebitis or edema.  Pedal pulses are good.  There is no cyanosis or clubbing. Extremities are cool.  Neurologic exam not tested.  Integument reveals no rash  Lab Results:  Basename 04/27/12 0634 04/27/12 0500  WBC 11.2* 7.9  HGB 10.8* 8.1*  PLT 126* 87*    Basename 04/28/12 0455 04/27/12 0634  NA 141 139  K 3.6 2.9*  CL 101 97  CO2 34* 36*  GLUCOSE 128* 166*  BUN 30* 31*  CREATININE 0.70 0.77    Basename 04/26/12 1448 04/26/12 0542  TROPONINI <0.30 <0.30   Hepatic Function Panel  Basename 04/25/12 2248  PROT 8.2  ALBUMIN 2.6*  AST 77*  ALT 43  ALKPHOS 127*  BILITOT 1.6*  BILIDIR --  IBILI --   No results found for this basename: CHOL in the last 72 hours No results found for this basename: PROTIME in the last 72 hours  Imaging: Dg Chest Port 1 View  04/28/2012  *RADIOLOGY REPORT*  Clinical Data: Bilateral bronchi.  PORTABLE CHEST - 1 VIEW  Comparison: 04/27/2012  Findings: Reverse lordotic positioning noted with endotracheal tube thought to be about 5 mm above the carina.  Consider retraction of 2 cm.  Nasogastric tube noted entering the stomach.  Low lung volumes present.  Stable bibasilar airspace opacities obscure the hemidiaphragms. Underlying cardiomegaly noted.  Left IJ line tip projects  over the SVC.  IMPRESSION:  1.  Endotracheal tube tip is just above the carina.  Retraction of 2 cm is recommended. 2.  Cardiomegaly with bibasilar airspace opacities suspicious for pleural effusions and atelectasis.  Original Report Authenticated By: Dellia Cloud, M.D.   Dg Chest Port 1 View  04/27/2012  *RADIOLOGY REPORT*  Clinical Data: Evaluate endotracheal tube positioning  PORTABLE CHEST - 1 VIEW  Comparison: 04/26/2012; 04/25/2012; 04/24/2012  Findings: Grossly unchanged enlarged cardiac silhouette and mediastinal contours.  Stable positioning of support apparatus. Lung volumes remain persistently reduced with small bilateral effusions.  Pulmonary vasculature is indistinct with cephalization of flow.  No definite pneumothorax.  Unchanged bones.  IMPRESSION: 1.  Stable positioning of support apparatus.  No pneumothorax. 2.  Persistently reduced lung volumes with stable findings of pulmonary edema, bilateral effusions and bibasilar opacities, atelectasis versus infiltrate.  Original Report Authenticated By: Waynard Reeds, M.D.    Cardiac Studies:  Assessment/Plan:  Patient Active Hospital Problem List: Lethargy (04/24/2012)   Assessment: Labs show that he is not hypothyroid   Plan: Stop synthroid Ischemic heart disease (03/20/2011)   Assessment: Enzymes negative for MI   Plan: Continue ASA and plavix Congestive heart failure with left ventricular systolic dysfunction (08/07/2011)   Assessment: EF 25-30 % by echo 04/24/12   Plan: support with dopamine  Atrial fibrillation (04/24/2012)   Assessment: Back in NSR   Plan: Off full dose heparin, on ASA and Plavix   Acute respiratory failure with hypoxia (04/26/2012)   Assessment: Improving.   Plan: Possible extubation later today after family present.    LOS: 5 days    Cassell Clement 04/28/2012, 9:30 AM

## 2012-04-29 ENCOUNTER — Inpatient Hospital Stay (HOSPITAL_COMMUNITY): Payer: Medicare Other

## 2012-04-29 LAB — BASIC METABOLIC PANEL
CO2: 34 mEq/L — ABNORMAL HIGH (ref 19–32)
Calcium: 8.4 mg/dL (ref 8.4–10.5)
Creatinine, Ser: 0.67 mg/dL (ref 0.50–1.35)
GFR calc non Af Amer: 86 mL/min — ABNORMAL LOW (ref >90)

## 2012-04-29 LAB — CBC WITH DIFFERENTIAL/PLATELET
Basophils Absolute: 0 10*3/uL (ref 0.0–0.1)
Lymphs Abs: 1.3 10*3/uL (ref 0.7–4.0)
MCH: 25.2 pg — ABNORMAL LOW (ref 26.0–34.0)
MCV: 78.7 fL (ref 78.0–100.0)
Monocytes Absolute: 0.5 10*3/uL (ref 0.1–1.0)
Monocytes Relative: 7 % (ref 3–12)
Platelets: 113 10*3/uL — ABNORMAL LOW (ref 150–400)
RDW: 19.1 % — ABNORMAL HIGH (ref 11.5–15.5)
WBC: 7.5 10*3/uL (ref 4.0–10.5)

## 2012-04-29 MED ORDER — IPRATROPIUM BROMIDE 0.02 % IN SOLN
RESPIRATORY_TRACT | Status: AC
  Administered 2012-04-30: 0.5 mg
  Filled 2012-04-29: qty 2.5

## 2012-04-29 MED ORDER — PREDNISONE 20 MG PO TABS: 20.0000 mg | ORAL_TABLET | Freq: Every day | ORAL | Status: DC

## 2012-04-29 MED ORDER — ALBUTEROL SULFATE (5 MG/ML) 0.5% IN NEBU
INHALATION_SOLUTION | RESPIRATORY_TRACT | Status: AC
  Administered 2012-04-30: 2.5 mg via RESPIRATORY_TRACT
  Filled 2012-04-29: qty 0.5

## 2012-04-29 MED ORDER — DEXTROSE 5 % IV SOLN
1.0000 g | INTRAVENOUS | Status: AC
  Administered 2012-04-29 – 2012-04-30 (×2): 1 g via INTRAVENOUS
  Filled 2012-04-29 (×2): qty 10

## 2012-04-29 MED ORDER — IPRATROPIUM BROMIDE 0.02 % IN SOLN
0.5000 mg | RESPIRATORY_TRACT | Status: DC
  Administered 2012-04-29 – 2012-05-04 (×29): 0.5 mg via RESPIRATORY_TRACT
  Filled 2012-04-29 (×27): qty 2.5

## 2012-04-29 MED ORDER — ALBUTEROL SULFATE (5 MG/ML) 0.5% IN NEBU
2.5000 mg | INHALATION_SOLUTION | RESPIRATORY_TRACT | Status: DC
  Administered 2012-04-29 – 2012-05-04 (×29): 2.5 mg via RESPIRATORY_TRACT
  Filled 2012-04-29 (×27): qty 0.5

## 2012-04-29 MED ORDER — HYDROCORTISONE SOD SUCCINATE 100 MG IJ SOLR
50.0000 mg | Freq: Four times a day (QID) | INTRAMUSCULAR | Status: DC
  Administered 2012-04-29 – 2012-05-01 (×9): 50 mg via INTRAVENOUS
  Filled 2012-04-29 (×12): qty 1

## 2012-04-29 NOTE — Progress Notes (Signed)
Name: CORDAY WYKA MRN: [id number] DOB: 08/17/1925    LOS: 6  PULMONARY / CRITICAL CARE MEDICINE   HPI:   78 year old male with PMH relevant for CAD, systolic CHF, Vfib, HTN, COPD Admitted initially to the Cardiology service for management of acute decompensated CHF. He was also started on heparin drip for new A. Fib.  code blue was called. He developed Vtach requiring CPR for a brief period of time with return to spontaneous circulation. He was intubated.  Subjective: extrubated successfully, DNI Low dose dop  Vital Signs: Temp:  [97.7 F (36.5 C)-98.6 F (37 C)] 98 F (36.7 C) (07/09 0800) Pulse Rate:  [55-70] 55  (07/09 0900) Resp:  [15-30] 25  (07/09 0900) BP: (72-136)/(38-74) 96/46 mmHg (07/09 0900) SpO2:  [89 %-100 %] 94 % (07/09 0903) FiO2 (%):  [50 %] 50 % (07/08 1136) Weight:  [74.5 kg (164 lb 3.9 oz)] 74.5 kg (164 lb 3.9 oz) (07/09 0500)  Physical Examination: Gen: rass 1 HEENT: NCAT, jvd noted PULM: coarse basesm CTA CV: RRR, no mgr, no JVD AB: BS+, soft, nontender, no hsm Ext: warm, scd's Derm: no rash or skin breakdown Neuro: awake and alert, moves all four ext, follows commands   Principal Problem:  *Lethargy Active Problems:  Ischemic heart disease  Congestive heart failure with left ventricular systolic dysfunction  COPD (chronic obstructive pulmonary disease)  Atrial fibrillation  Respiratory failure with hypercapnia  Acute respiratory failure with hypoxia  Shock   ASSESSMENT AND PLAN 78 y/o male with CHF, CAD and likely COPD admitted 7/3 with lethargy but developed respiratory failure on 7/5 leading to PEA arrest and VTach. He was intubated and found to be markedly hypotensive afterwards requiring vasopressors.  DDx includes septic shock from pneumonia vs. Cardiogenic shock. 7/5 decompensation likely respiratory due to pneumonia with some septic shock.   PULMONARY  Lab 04/28/12 1127 04/28/12 0333 04/27/12 0734 04/27/12 0400 04/26/12 0800  04/26/12 0455  PHART 7.449 7.491* 7.396 7.485* -- 7.452*  PCO2ART 55.4* 47.3* 61.5* 50.0* -- 56.7*  PO2ART 98.0 91.8 71.0* 102.0* -- 111.0*  HCO3 38.4* 35.8* 37.8* 37.3* -- 39.1*  O2SAT 98.0 98.9 93.0 98.6 93.7 --   Ventilator Settings: Vent Mode:  [-] PRVC FiO2 (%):  [50 %] 50 % Set Rate:  [12 bmp] 12 bmp Vt Set:  [450 mL] 450 mL PEEP:  [5 cmH20] 5 cmH20 Plateau Pressure:  [18 cmH20] 18 cmH20 CXR:  Bilateral lung infiltrates, unchanged. ETT:  In position  A:   1) Acute hypercarbic and hypoxemic respiratory failure 2) COPD with evidence of chronic hypercarbia. 3) Pneumonia: aspiration vs. HCAP 4. Effusion left?  P:   - pcxr reviewed, small lung volumes, IS important, sit upright when able - Combivent q4 hrs - wean Solumedrol to pred when off pressors, for now add stress dose steroids as continued pressor needs -neg balance preferred -if distress I will [place] left base  CARDIOVASCULAR  Lab 04/26/12 1448 04/26/12 0542 04/25/12 2248 04/24/12 0435 04/23/12 2236 04/23/12 1626  TROPONINI <0.30 <0.30 <0.30 <0.30 <0.30 --  LATICACIDVEN -- -- 2.0 -- -- --  PROBNP -- -- 14991.0* -- -- 4908.0*   ECG:  Sinus rhythm Lines: Left IJ 04/25/12 >>  A:  Shock, likely multifactorial  Bedside echocardiogram with 25% LVEF. Chest X ray with bilateral diffuse infiltrates P:  - Currently on dopamine, consider accepting lower MAp goals 55 with good MS -avoid pos balance, lasix consideration, regardless off pressor needs if O2, distress worsening  Steroids to stress dosing in setting continue pressor needs -if remains on pressors in am , consider addition vaso  RENAL  Lab 04/29/12 0510 04/28/12 0455 04/27/12 0634 04/27/12 0500 04/26/12 0500 04/25/12 2248 04/23/12 1626  NA 142 141 139 145 139 -- --  K 4.0 3.6 -- -- -- -- --  CL 104 101 97 114* 96 -- --  CO2 34* 34* 36* 24 38* -- --  BUN 25* 30* 31* 20 27* -- --  CREATININE 0.67 0.70 0.77 0.47* 1.02 -- --  CALCIUM 8.4 8.1* 8.4 5.1* 8.9 -- --    MG -- -- -- -- -- 1.8 2.5  PHOS -- -- -- -- -- 3.7 --   Intake/Output      07/08 0701 - 07/09 0700 07/09 0701 - 07/10 0700   I.V. (mL/kg) 544 (7.3) 55.6 (0.7)   NG/GT 150    IV Piggyback 187.5 12.5   Total Intake(mL/kg) 881.5 (11.8) 68.1 (0.9)   Urine (mL/kg/hr) 1600 (0.9)    Total Output 1600    Net -718.5 +68.1         Foley:  Placed 04/25/12  A:   Creatinine improved, would need neg balance to continue P:   - lasix if off pressors - Monitor BMP in am  -kvo Would NOT bolus  GASTROINTESTINAL  Lab 04/25/12 2248 04/23/12 1626  AST 77* 61*  ALT 43 34  ALKPHOS 127* 129*  BILITOT 1.6* 1.1  PROT 8.2 7.8  ALBUMIN 2.6* 2.6*    A:  TF P:   - GI prophylaxis with protonix until on full diet  - advance diet as tolerated  HEMATOLOGIC  Lab 04/29/12 0510 04/27/12 0634 04/27/12 0500 04/26/12 0500 04/25/12 2248 04/23/12 2124 04/23/12 1626  HGB 10.3* 10.8* 8.1* 10.9* 11.4* -- --  HCT 32.2* 32.7* 24.0* 33.9* 35.2* -- --  PLT 113* 126* 87* 142* 144* -- --  INR -- -- -- -- -- 1.30 1.30  APTT -- -- -- -- -- -- 36   A:  1) Anemia dilutional   INFECTIOUS  Lab 04/29/12 0510 04/27/12 0634 04/27/12 0500 04/26/12 0500 04/25/12 2248  WBC 7.5 11.2* 7.9 13.2* 13.3*  PROCALCITON -- -- -- -- 0.16   Cultures: Blood, tracheal and urine cultures sent.  Antibiotics: - Zosyn (04/25/12)>>>7/9 Ceftriaxone 7/9>>>plan stop 7/11 - Vancomycin (04/25/12)  >> 7/7  A:  1) Shock, cardiogenic vs septic (doubt) 2) Pneumonia: HCAP vs. aspiration P:   Remains culture neg, dc zosyn, add ceftriaxone, add stop date total 7 days abx  ENDOCRINE  Lab 04/28/12 0755 04/28/12 0420 04/28/12 0005 04/27/12 2009 04/25/12 2339  GLUCAP 107* 114* 153* 132* 154*   A:   No history of diabetes Controlled on own  NEUROLOGIC  A:   Following commands P:   - dc versed Chair position if able Pt when off pressors  BEST PRACTICE / DISPOSITION - Level of Care:  ICU - Primary Service:  Cardiology -  Consultants:  CCM - Code Status:  DNR - Diet:  Start tube feeds today - DVT Px:  Heparin drip - GI Px:  Protonix - Skin Integrity:  Intact. - Social / Family:  Family updated by CCM on 7/7 PM, requested full support, but DNR (no CPR). Updated 7/8 , DNI established   Mcarthur Rossetti. Tyson Alias, MD, FACP Pgr: [phone] [facility]

## 2012-04-29 NOTE — Progress Notes (Signed)
Subjective:  Patient was extubated yesterday. Has done well overnight. Alert. No chest pain.  BP still low requiring dopamine support. Rhythm NSR.  Objective:  Vital Signs in the last 24 hours: Temp:  [97.7 F (36.5 C)-98.6 F (37 C)] 97.7 F (36.5 C) (07/09 0400) Pulse Rate:  [55-72] 55  (07/09 0700) Resp:  [16-30] 16  (07/09 0700) BP: (92-136)/(38-74) 92/38 mmHg (07/09 0700) SpO2:  [89 %-100 %] 100 % (07/09 0700) FiO2 (%):  [50 %] 50 % (07/08 1136) Weight:  [164 lb 3.9 oz (74.5 kg)] 164 lb 3.9 oz (74.5 kg) (07/09 0500)  Intake/Output from previous day: 07/08 0701 - 07/09 0700 In: 856.5 [I.V.:544; NG/GT:150; IV Piggyback:162.5] Out: 1600 [Urine:1600] Intake/Output from this shift:       . a stronger pump book   Does not apply Once  . albuterol-ipratropium  6-8 puff Inhalation Q4H  . antiseptic oral rinse  15 mL Mouth Rinse BID  . aspirin EC  81 mg Oral Daily  . atorvastatin  20 mg Oral q1800  . calcium-vitamin D  1 tablet Oral BID  . chlorhexidine  15 mL Mouth/Throat BID  . clopidogrel  75 mg Oral Q breakfast  . feeding supplement  30 mL Per Tube QID  . ferrous sulfate  325 mg Oral Q breakfast  . heparin subcutaneous  5,000 Units Subcutaneous Q8H  . methylPREDNISolone (SOLU-MEDROL) injection  20 mg Intravenous Q24H  . pantoprazole  40 mg Oral QHS  . piperacillin-tazobactam (ZOSYN)  IV  3.375 g Intravenous Q8H  . sodium chloride  3 mL Intravenous Q12H  . DISCONTD: levothyroxine  25 mcg Oral QAC breakfast  . DISCONTD: methylPREDNISolone (SOLU-MEDROL) injection  40 mg Intravenous Q24H  . DISCONTD: pantoprazole (PROTONIX) IV  40 mg Intravenous QHS  . DISCONTD: pantoprazole sodium  40 mg Per Tube QHS      . DOPamine 5 mcg/kg/min (04/29/12 0500)  . DISCONTD: feeding supplement (OXEPA) Stopped (04/28/12 1121)  . DISCONTD: norepinephrine (LEVOPHED) Adult infusion Stopped (04/26/12 1100)    Physical Exam: The patient appears to be in no distress.  Chest: expiratory  rhonchi  Heart: gr 2/6 systolic murmur at base. Abdomen nontender. Extremities no edema.  Lab Results:  Basename 04/29/12 0510 04/27/12 0634  WBC 7.5 11.2*  HGB 10.3* 10.8*  PLT 113* 126*    Basename 04/29/12 0510 04/28/12 0455  NA 142 141  K 4.0 3.6  CL 104 101  CO2 34* 34*  GLUCOSE 108* 128*  BUN 25* 30*  CREATININE 0.67 0.70    Basename 04/26/12 1448  TROPONINI <0.30   Hepatic Function Panel No results found for this basename: PROT,ALBUMIN,AST,ALT,ALKPHOS,BILITOT,BILIDIR,IBILI in the last 72 hours No results found for this basename: CHOL in the last 72 hours No results found for this basename: PROTIME in the last 72 hours  Imaging: Dg Chest Port 1 View  04/29/2012  *RADIOLOGY REPORT*  Clinical Data: Short of breath.  PORTABLE CHEST - 1 VIEW  Comparison: 04/28/2012.  Findings: Low volume chest.  Left IJ central line remains present. The tip is at the confluence of the brachiocephalic veins.  Patient is kyphotic, producing a lordotic projection.  Bilateral left greater than right pleural effusion with basilar atelectasis. Cardiomegaly.  Mediastinal contours unchanged allowing for technique.  Compared to prior, the enteric tube and endotracheal tube has been removed.  IMPRESSION:  1.  Interval removal of endotracheal tube and enteric tube. 2.  Left IJ central line persists. 3.  Unchanged pulmonary aeration with left  greater than right basilar atelectasis, airspace disease and effusion.  Original Report Authenticated By: Andreas Newport, M.D.   Dg Chest Port 1 View  04/28/2012  *RADIOLOGY REPORT*  Clinical Data: Bilateral bronchi.  PORTABLE CHEST - 1 VIEW  Comparison: 04/27/2012  Findings: Reverse lordotic positioning noted with endotracheal tube thought to be about 5 mm above the carina.  Consider retraction of 2 cm.  Nasogastric tube noted entering the stomach.  Low lung volumes present.  Stable bibasilar airspace opacities obscure the hemidiaphragms. Underlying cardiomegaly noted.   Left IJ line tip projects over the SVC.  IMPRESSION:  1.  Endotracheal tube tip is just above the carina.  Retraction of 2 cm is recommended. 2.  Cardiomegaly with bibasilar airspace opacities suspicious for pleural effusions and atelectasis.  Original Report Authenticated By: Dellia Cloud, M.D.    Cardiac Studies:  Assessment/Plan:  Hypotension: BP still low.  Not presently on diuretics. Euvolemic clinically. Still requiring dopamine support.  Ischemic heart disease No chest pain.  Continue ASA and plavix.  Respiratory failure: Some of his previous CO2 narcosis and hypercapnia may be due to his chest cage deformity which mechanically limits his respiratory effort (loss of thoracic spine height from compression fractures and spine deformity etc).  Plan: Advance diet and activity today. PT/OT  LOS: 6 days    Trevor [name] 04/29/2012, 8:38 AM

## 2012-04-30 ENCOUNTER — Inpatient Hospital Stay (HOSPITAL_COMMUNITY): Payer: Medicare Other

## 2012-04-30 LAB — POCT I-STAT, CHEM 8
Hemoglobin: 12.2 g/dL — ABNORMAL LOW (ref 13.0–17.0)
Sodium: 137 mEq/L (ref 135–145)
TCO2: 34 mmol/L (ref 0–100)

## 2012-04-30 LAB — BASIC METABOLIC PANEL
Calcium: 8.2 mg/dL — ABNORMAL LOW (ref 8.4–10.5)
Creatinine, Ser: 0.66 mg/dL (ref 0.50–1.35)
GFR calc Af Amer: >90 mL/min (ref >90)

## 2012-04-30 MED ORDER — POTASSIUM CHLORIDE 20 MEQ/15ML (10%) PO LIQD
20.0000 meq | Freq: Once | ORAL | Status: AC
  Administered 2012-04-30: 20 meq
  Filled 2012-04-30: qty 15

## 2012-04-30 MED ORDER — FUROSEMIDE 10 MG/ML IJ SOLN
20.0000 mg | Freq: Two times a day (BID) | INTRAMUSCULAR | Status: DC
  Administered 2012-04-30 (×2): 20 mg via INTRAVENOUS
  Filled 2012-04-30 (×4): qty 2

## 2012-04-30 MED ORDER — POLYETHYLENE GLYCOL 3350 17 G PO PACK
17.0000 g | PACK | Freq: Every day | ORAL | Status: DC | PRN
  Administered 2012-05-16 – 2012-05-17 (×2): 17 g via ORAL
  Filled 2012-04-30 (×3): qty 1

## 2012-04-30 MED ORDER — DOCUSATE SODIUM 100 MG PO CAPS
100.0000 mg | ORAL_CAPSULE | Freq: Two times a day (BID) | ORAL | Status: DC
  Administered 2012-04-30 – 2012-05-05 (×12): 100 mg via ORAL
  Filled 2012-04-30 (×14): qty 1

## 2012-04-30 NOTE — Progress Notes (Signed)
Name: CHRISTOS MIXSON MRN: [id number] DOB: 09-13-1925    LOS: 7  PULMONARY / CRITICAL CARE MEDICINE   HPI:   78 year old male with PMH relevant for CAD, systolic CHF, Vfib, HTN, COPD Admitted initially to the Cardiology service for management of acute decompensated CHF. He was also started on heparin drip for new A. Fib.  code blue was called. He developed Vtach requiring CPR for a brief period of time with return to spontaneous circulation. He was intubated.  Subjective: No distress 3 mics dop  Vital Signs: Temp:  [97.6 F (36.4 C)-98.8 F (37.1 C)] 97.6 F (36.4 C) (07/10 0400) Pulse Rate:  [46-68] 56  (07/10 0700) Resp:  [17-34] 22  (07/10 0700) BP: (87-132)/(32-66) 108/53 mmHg (07/10 0700) SpO2:  [94 %-100 %] 96 % (07/10 0712)  Physical Examination: Gen: awake wnl HEENT: NCAT, jvd noted PULM: CTA anterior CV: RRR, no mgr, no JVD AB: BS+, soft, nontender, no hsm Ext: warm, scd's Neuro: awake and alert, moves all four ext, follows commands   Principal Problem:  *Lethargy Active Problems:  Ischemic heart disease  Congestive heart failure with left ventricular systolic dysfunction  COPD (chronic obstructive pulmonary disease)  Atrial fibrillation  Respiratory failure with hypercapnia  Acute respiratory failure with hypoxia  Shock   ASSESSMENT AND PLAN 78 y/o male with CHF, CAD and likely COPD admitted 7/3 with lethargy but developed respiratory failure on 7/5 leading to PEA arrest and VTach. He was intubated and found to be markedly hypotensive afterwards requiring vasopressors.  DDx includes septic shock from pneumonia vs. Cardiogenic shock. 7/5 decompensation likely respiratory due to pneumonia with some septic shock.   PULMONARY  Lab 04/28/12 1127 04/28/12 0333 04/27/12 0734 04/27/12 0400 04/26/12 0800 04/26/12 0455  PHART 7.449 7.491* 7.396 7.485* -- 7.452*  PCO2ART 55.4* 47.3* 61.5* 50.0* -- 56.7*  PO2ART 98.0 91.8 71.0* 102.0* -- 111.0*  HCO3 38.4* 35.8*  37.8* 37.3* -- 39.1*  O2SAT 98.0 98.9 93.0 98.6 93.7 --   Ventilator Settings:   CXR:  Bilateral lung infiltrates, unchanged. ETT:  In position  A:   1) Acute hypercarbic and hypoxemic respiratory failure 2) COPD with evidence of chronic hypercarbia. 3) Pneumonia: aspiration vs. HCAP 4. Effusions bilateral  P:   - pcxr reviewed, small lung volumes, IS important, sit upright when able - Combivent q4 hrs - stress dose steroids as continued pressor needs, when off pressors taper to off over 4-7 days -neg balance preferred, regardless off dopamine at this stage , DNI -if distress I will [place] chest for consideration tap -pcxr in am   CARDIOVASCULAR  Lab 04/26/12 1448 04/26/12 0542 04/25/12 2248 04/24/12 0435 04/23/12 2236 04/23/12 1626  TROPONINI <0.30 <0.30 <0.30 <0.30 <0.30 --  LATICACIDVEN -- -- 2.0 -- -- --  PROBNP -- -- 14991.0* -- -- 4908.0*   ECG:  Sinus rhythm Lines: Left IJ 04/25/12 >>  A:  Shock, likely multifactorial  Bedside echocardiogram with 25% LVEF. Chest X ray with bilateral diffuse infiltrates P:  - Dopmamine just off, MAp goal 50-55 -avoid pos balance, lasix consideration, effusions increasing Steroids to stress dosing until off pressors Goal to dc a line in am   RENAL  Lab 04/30/12 0400 04/29/12 0510 04/28/12 0455 04/27/12 0634 04/27/12 0500 04/25/12 2248 04/23/12 1626  NA 141 142 141 139 145 -- --  K 3.9 4.0 -- -- -- -- --  CL 103 104 101 97 114* -- --  CO2 34* 34* 34* 36* 24 -- --  BUN 21 25* 30* 31* 20 -- --  CREATININE 0.66 0.67 0.70 0.77 0.47* -- --  CALCIUM 8.2* 8.4 8.1* 8.4 5.1* -- --  MG -- -- -- -- -- 1.8 2.5  PHOS -- -- -- -- -- 3.7 --   Intake/Output      07/09 0701 - 07/10 0700 07/10 0701 - 07/11 0700   P.O. 1080    I.V. (mL/kg) 674.1 (9)    NG/GT     IV Piggyback 62.5    Total Intake(mL/kg) 1816.6 (24.4)    Urine (mL/kg/hr) 650 (0.4)    Total Output 650    Net +1166.6          Foley:  Placed 04/25/12  A:   Creatinine  improved, would need neg balance to continue P:   - lasix added Small K supp kvo  GASTROINTESTINAL  Lab 04/25/12 2248 04/23/12 1626  AST 77* 61*  ALT 43 34  ALKPHOS 127* 129*  BILITOT 1.6* 1.1  PROT 8.2 7.8  ALBUMIN 2.6* 2.6*    A:  TF P:   - GI prophylaxis - advance diet as tolerated, did well  HEMATOLOGIC  Lab 04/29/12 0510 04/27/12 0634 04/27/12 0500 04/26/12 0500 04/25/12 2248 04/23/12 2124 04/23/12 1626  HGB 10.3* 10.8* 8.1* 10.9* 11.4* -- --  HCT 32.2* 32.7* 24.0* 33.9* 35.2* -- --  PLT 113* 126* 87* 142* 144* -- --  INR -- -- -- -- -- 1.30 1.30  APTT -- -- -- -- -- -- 36   A:  1) Anemia dilutional P lasix   INFECTIOUS  Lab 04/29/12 0510 04/27/12 0634 04/27/12 0500 04/26/12 0500 04/25/12 2248  WBC 7.5 11.2* 7.9 13.2* 13.3*  PROCALCITON -- -- -- -- 0.16   Cultures: Blood, tracheal and urine cultures sent.  Antibiotics: - Zosyn (04/25/12)>>>7/9 Ceftriaxone 7/9>>>plan stop 7/11 - Vancomycin (04/25/12)  >> 7/7  A:  1) Shock, cardiogenic vs septic (doubt) 2) Pneumonia: HCAP vs. aspiration P:   Dc ceftriaxone after July 11th dose  ENDOCRINE  Lab 04/28/12 0755 04/28/12 0420 04/28/12 0005 04/27/12 2009 04/25/12 2339  GLUCAP 107* 114* 153* 132* 154*   A:   No history of diabetes Controlled on own  NEUROLOGIC  A:   Following commands P:   Chair position if able Pt  BEST PRACTICE / DISPOSITION - Level of Care:  ICU - Primary Service:  Cardiology - Consultants:  CCM - Code Status:  DNR - Diet:  Start tube feeds today - DVT Px:  Heparin drip - GI Px:  Protonix - Skin Integrity:  Intact. - Social / Family:  Family updated by CCM on 7/7 PM, requested full support, but DNR (no CPR). Updated 7/8 , DNI established   Mcarthur Rossetti. Tyson Alias, MD, FACP Pgr: [phone]  Pulmonary & Critical Care

## 2012-04-30 NOTE — Progress Notes (Signed)
Subjective:  Patient is doing better. No chest pain. No increase in dyspnea.  BP better and lasix will be restarted in low dose today. Tolerated up in chair yesterday.  Objective:  Vital Signs in the last 24 hours: Temp:  [97.6 F (36.4 C)-98.8 F (37.1 C)] 97.6 F (36.4 C) (07/10 0400) Pulse Rate:  [46-68] 56  (07/10 0700) Resp:  [17-34] 22  (07/10 0700) BP: (87-132)/(32-66) 108/53 mmHg (07/10 0700) SpO2:  [94 %-100 %] 96 % (07/10 0712)  Intake/Output from previous day: 07/09 0701 - 07/10 0700 In: 1816.6 [P.O.:1080; I.V.:674.1; IV Piggyback:62.5] Out: 650 [Urine:650] Intake/Output from this shift:       . a stronger pump book   Does not apply Once  . albuterol  2.5 mg Nebulization Q4H  . antiseptic oral rinse  15 mL Mouth Rinse BID  . aspirin EC  81 mg Oral Daily  . atorvastatin  20 mg Oral q1800  . calcium-vitamin D  1 tablet Oral BID  . cefTRIAXone (ROCEPHIN)  IV  1 g Intravenous Q24H  . chlorhexidine  15 mL Mouth/Throat BID  . clopidogrel  75 mg Oral Q breakfast  . docusate sodium  100 mg Oral BID  . feeding supplement  30 mL Per Tube QID  . ferrous sulfate  325 mg Oral Q breakfast  . furosemide  20 mg Intravenous BID  . heparin subcutaneous  5,000 Units Subcutaneous Q8H  . hydrocortisone sodium succinate  50 mg Intravenous Q6H  . ipratropium      . ipratropium  0.5 mg Nebulization Q4H  . pantoprazole  40 mg Oral QHS  . potassium chloride  20 mEq Per Tube Once  . sodium chloride  3 mL Intravenous Q12H  . DISCONTD: albuterol-ipratropium  6-8 puff Inhalation Q4H  . DISCONTD: methylPREDNISolone (SOLU-MEDROL) injection  20 mg Intravenous Q24H  . DISCONTD: piperacillin-tazobactam (ZOSYN)  IV  3.375 g Intravenous Q8H  . DISCONTD: predniSONE  20 mg Oral Q breakfast      . DOPamine 3 mcg/kg/min (04/30/12 0700)    Physical Exam: The patient appears to be in no distress.  Chest: expiratory rhonchi  Heart: gr 2/6 systolic murmur at base. Abdomen nontender.  Bowel sounds present. Constipated. Extremities no edema.  Lab Results:  Basename 04/29/12 0510  WBC 7.5  HGB 10.3*  PLT 113*    Basename 04/30/12 0400 04/29/12 0510  NA 141 142  K 3.9 4.0  CL 103 104  CO2 34* 34*  GLUCOSE 168* 108*  BUN 21 25*  CREATININE 0.66 0.67   No results found for this basename: TROPONINI:2,CK,MB:2 in the last 72 hours Hepatic Function Panel No results found for this basename: PROT,ALBUMIN,AST,ALT,ALKPHOS,BILITOT,BILIDIR,IBILI in the last 72 hours No results found for this basename: CHOL in the last 72 hours No results found for this basename: PROTIME in the last 72 hours  Imaging: Dg Chest Port 1 View  04/30/2012  *RADIOLOGY REPORT*  Clinical Data: Edema.  Pleural effusions.  PORTABLE CHEST - 1 VIEW  Comparison: Plain film chest 04/29/2012 and 04/28/2012.  Findings: Extensive bilateral airspace disease and pleural effusions appear worsened.  There is cardiomegaly.  Left IJ catheter remains in place.  IMPRESSION: Worsened bilateral effusions and airspace disease.  Original Report Authenticated By: Bernadene Bell. Maricela Curet, M.D.   Dg Chest Port 1 View  04/29/2012  *RADIOLOGY REPORT*  Clinical Data: Short of breath.  PORTABLE CHEST - 1 VIEW  Comparison: 04/28/2012.  Findings: Low volume chest.  Left IJ central line remains  present. The tip is at the confluence of the brachiocephalic veins.  Patient is kyphotic, producing a lordotic projection.  Bilateral left greater than right pleural effusion with basilar atelectasis. Cardiomegaly.  Mediastinal contours unchanged allowing for technique.  Compared to prior, the enteric tube and endotracheal tube has been removed.  IMPRESSION:  1.  Interval removal of endotracheal tube and enteric tube. 2.  Left IJ central line persists. 3.  Unchanged pulmonary aeration with left greater than right basilar atelectasis, airspace disease and effusion.  Original Report Authenticated By: Andreas Newport, M.D.    Cardiac  Studies:  Assessment/Plan:  Hypotension:      BP stable off dopamine.  Ischemic heart disease No chest pain.  Continue ASA and plavix.  Respiratory failure: Some of his previous CO2 narcosis and hypercapnia may be due to his chest cage deformity which mechanically limits his respiratory effort (loss of thoracic spine height from compression fractures and spine deformity etc).  Constipation: Will add colace and miralax.  Plan: Advance diet and activity today. PT/OT  LOS: 7 days    Cassell Clement 04/30/2012, 8:38 AM

## 2012-05-01 ENCOUNTER — Inpatient Hospital Stay (HOSPITAL_COMMUNITY): Payer: Medicare Other

## 2012-05-01 LAB — BASIC METABOLIC PANEL
BUN: 25 mg/dL — ABNORMAL HIGH (ref 6–23)
Chloride: 99 mEq/L (ref 96–112)
GFR calc Af Amer: >90 mL/min (ref >90)
GFR calc non Af Amer: 86 mL/min — ABNORMAL LOW (ref >90)
Potassium: 4 mEq/L (ref 3.5–5.1)
Sodium: 138 mEq/L (ref 135–145)

## 2012-05-01 MED ORDER — FUROSEMIDE 40 MG PO TABS
40.0000 mg | ORAL_TABLET | Freq: Every day | ORAL | Status: DC
  Administered 2012-05-01: 40 mg via ORAL
  Filled 2012-05-01 (×2): qty 1

## 2012-05-01 MED ORDER — HYDROCORTISONE SOD SUCCINATE 100 MG IJ SOLR
25.0000 mg | Freq: Four times a day (QID) | INTRAMUSCULAR | Status: DC
  Administered 2012-05-01 – 2012-05-07 (×22): 25 mg via INTRAVENOUS
  Administered 2012-05-07: 22:00:00 via INTRAVENOUS
  Administered 2012-05-07 – 2012-05-09 (×7): 25 mg via INTRAVENOUS
  Filled 2012-05-01 (×35): qty 0.5

## 2012-05-01 NOTE — Progress Notes (Signed)
Subjective:  Patient is doing better. No chest pain. No increase in dyspnea.  BP stable off dopamine. Chest xray yesterday showed worse congestion. Now on lasix. Needs daily weights.   Objective:  Vital Signs in the last 24 hours: Temp:  [97 F (36.1 C)-98 F (36.7 C)] 97.8 F (36.6 C) (07/11 0800) Pulse Rate:  [60-78] 70  (07/11 0700) Resp:  [21-35] 28  (07/11 0700) BP: (91-129)/(42-74) 129/66 mmHg (07/11 0800) SpO2:  [83 %-100 %] 96 % (07/11 0800)  Intake/Output from previous day: 07/10 0701 - 07/11 0700 In: 474 [P.O.:360; I.V.:110; IV Piggyback:4] Out: 1102 [Urine:1100; Stool:2] Intake/Output from this shift:       . a stronger pump book   Does not apply Once  . albuterol  2.5 mg Nebulization Q4H  . antiseptic oral rinse  15 mL Mouth Rinse BID  . aspirin EC  81 mg Oral Daily  . atorvastatin  20 mg Oral q1800  . calcium-vitamin D  1 tablet Oral BID  . cefTRIAXone (ROCEPHIN)  IV  1 g Intravenous Q24H  . chlorhexidine  15 mL Mouth/Throat BID  . clopidogrel  75 mg Oral Q breakfast  . docusate sodium  100 mg Oral BID  . feeding supplement  30 mL Per Tube QID  . ferrous sulfate  325 mg Oral Q breakfast  . furosemide  40 mg Oral Daily  . heparin subcutaneous  5,000 Units Subcutaneous Q8H  . hydrocortisone sodium succinate  50 mg Intravenous Q6H  . ipratropium  0.5 mg Nebulization Q4H  . pantoprazole  40 mg Oral QHS  . potassium chloride  20 mEq Per Tube Once  . sodium chloride  3 mL Intravenous Q12H  . DISCONTD: furosemide  20 mg Intravenous BID      . DOPamine 3 mcg/kg/min (04/30/12 0800)    Physical Exam: The patient appears to be in no distress.  Chest: expiratory rhonchi bilateral  Heart: gr 2/6 systolic murmur at base and apex. Abdomen nontender. Bowel sounds present. Constipated. Extremities no edema. Neuro: alert Lab Results:  Basename 04/29/12 0510  WBC 7.5  HGB 10.3*  PLT 113*    Basename 04/30/12 0400 04/29/12 0510  NA 141 142  K 3.9 4.0    CL 103 104  CO2 34* 34*  GLUCOSE 168* 108*  BUN 21 25*  CREATININE 0.66 0.67   No results found for this basename: TROPONINI:2,CK,MB:2 in the last 72 hours Hepatic Function Panel No results found for this basename: PROT,ALBUMIN,AST,ALT,ALKPHOS,BILITOT,BILIDIR,IBILI in the last 72 hours No results found for this basename: CHOL in the last 72 hours No results found for this basename: PROTIME in the last 72 hours  Imaging: Dg Chest Port 1 View  04/30/2012  *RADIOLOGY REPORT*  Clinical Data: Edema.  Pleural effusions.  PORTABLE CHEST - 1 VIEW  Comparison: Plain film chest 04/29/2012 and 04/28/2012.  Findings: Extensive bilateral airspace disease and pleural effusions appear worsened.  There is cardiomegaly.  Left IJ catheter remains in place.  IMPRESSION: Worsened bilateral effusions and airspace disease.  Original Report Authenticated By: Bernadene Bell. Maricela Curet, M.D.    Cardiac Studies:  Assessment/Plan:  Hypotension:      BP stable off dopamine.  Ischemic heart disease No chest pain.  Continue ASA and plavix.  Respiratory failure: Some of his previous CO2 narcosis and hypercapnia may be due to his chest cage deformity which mechanically limits his respiratory effort (loss of thoracic spine height from compression fractures and spine deformity etc).  Constipation: responding to  miralax.  Plan: Advance diet and activity today. PT/OT Daily BMET. Portable chest today.  LOS: 8 days    Trevor [name] 05/01/2012, 9:04 AM

## 2012-05-01 NOTE — Progress Notes (Signed)
Name: Trevor [name] MRN: [id number] DOB: October 06, 1925    LOS: 8  PULMONARY / CRITICAL CARE MEDICINE   HPI:   78 year old male with PMH relevant for CAD, systolic CHF, Vfib, HTN, COPD Admitted initially to the Cardiology service for management of acute decompensated CHF. He was also started on heparin drip for new A. Fib.  code blue was called. He developed Vtach requiring CPR for a brief period of time with return to spontaneous circulation. He was intubated.  Subjective: Neg balance  Vital Signs: Temp:  [97 F (36.1 C)-98 F (36.7 C)] 97.8 F (36.6 C) (07/11 0800) Pulse Rate:  [60-78] 70  (07/11 0700) Resp:  [21-35] 28  (07/11 0700) BP: (91-129)/(42-74) 129/66 mmHg (07/11 0800) SpO2:  [83 %-100 %] 96 % (07/11 0800)  Physical Examination: Gen: awake wnl HEENT: NCAT, jvd noted PULM: CTA anterior, reduced bases CV: RRR, no mgr, no JVD AB: BS+, soft, nontender, no hsm Ext: warm, scd's Neuro: awake and alert, moves all four ext, follows commands   Principal Problem:  *Lethargy Active Problems:  Ischemic heart disease  Congestive heart failure with left ventricular systolic dysfunction  COPD (chronic obstructive pulmonary disease)  Atrial fibrillation  Respiratory failure with hypercapnia  Acute respiratory failure with hypoxia  Shock   ASSESSMENT AND PLAN 78 y/o male with CHF, CAD and likely COPD admitted 7/3 with lethargy but developed respiratory failure on 7/5 leading to PEA arrest and VTach. He was intubated and found to be markedly hypotensive afterwards requiring vasopressors.  DDx includes septic shock from pneumonia vs. Cardiogenic shock.   PULMONARY  Lab 04/28/12 1127 04/28/12 0333 04/27/12 0734 04/27/12 0400 04/26/12 0800 04/26/12 0455  PHART 7.449 7.491* 7.396 7.485* -- 7.452*  PCO2ART 55.4* 47.3* 61.5* 50.0* -- 56.7*  PO2ART 98.0 91.8 71.0* 102.0* -- 111.0*  HCO3 38.4* 35.8* 37.8* 37.3* -- 39.1*  O2SAT 98.0 98.9 93.0 98.6 93.7 --   Ventilator  Settings:   CXR:  Bilateral lung infiltrates, unchanged, none new today ETT:  In position  A:   1) Acute hypercarbic and hypoxemic respiratory failure 2) COPD with evidence of chronic hypercarbia. 3) Pneumonia: aspiration vs. HCAP 4. Effusions bilateral  P:   - combivent - no distress, no role throra at this stage -continue lasix, neg baalnce -dni -IS  CARDIOVASCULAR  Lab 04/26/12 1448 04/26/12 0542 04/25/12 2248  TROPONINI <0.30 <0.30 <0.30  LATICACIDVEN -- -- 2.0  PROBNP -- -- 14991.0*   ECG:  Sinus rhythm Lines: Left IJ 04/25/12 >>  A:  Shock, likely multifactorial  Bedside echocardiogram with 25% LVEF. Chest X ray with bilateral diffuse infiltrates P:  - lasix to neg balance -Steroids to stress dosing taper to off over next 3 days -Dc a line, cvl when able -dc cvl when able  RENAL  Lab 04/30/12 0400 04/29/12 0510 04/28/12 0455 04/27/12 0634 04/27/12 0500 04/25/12 2248  NA 141 142 141 139 145 --  K 3.9 4.0 -- -- -- --  CL 103 104 101 97 114* --  CO2 34* 34* 34* 36* 24 --  BUN 21 25* 30* 31* 20 --  CREATININE 0.66 0.67 0.70 0.77 0.47* --  CALCIUM 8.2* 8.4 8.1* 8.4 5.1* --  MG -- -- -- -- -- 1.8  PHOS -- -- -- -- -- 3.7   Intake/Output      07/10 0701 - 07/11 0700 07/11 0701 - 07/12 0700   P.O. 360    I.V. (mL/kg) 110 (1.5)    IV  Piggyback 4    Total Intake(mL/kg) 474 (6.4)    Urine (mL/kg/hr) 1100 (0.6) 0   Stool 2    Total Output 1102 0   Net -628 0         Foley:  Placed 04/25/12  A:   Creatinine improved, would need neg balance to continue P:   - lasix did well with this dose, continue kvo  GASTROINTESTINAL  Lab 04/25/12 2248  AST 77*  ALT 43  ALKPHOS 127*  BILITOT 1.6*  PROT 8.2  ALBUMIN 2.6*    A:  TF P:   - GI prophylaxis - advance diet as tolerated, did well  HEMATOLOGIC  Lab 04/29/12 0510 04/27/12 0634 04/27/12 0500 04/26/12 0500 04/25/12 2248  HGB 10.3* 10.8* 8.1* 10.9* 11.4*  HCT 32.2* 32.7* 24.0* 33.9* 35.2*  PLT 113*  126* 87* 142* 144*  INR -- -- -- -- --  APTT -- -- -- -- --   A:  1) Anemia dilutional P Lasix, consider cbc follow up   INFECTIOUS  Lab 04/29/12 0510 04/27/12 0634 04/27/12 0500 04/26/12 0500 04/25/12 2248  WBC 7.5 11.2* 7.9 13.2* 13.3*  PROCALCITON -- -- -- -- 0.16   Cultures: Blood, tracheal and urine cultures sent.  Antibiotics: - Zosyn (04/25/12)>>>7/9 Ceftriaxone 7/9>>>plan stop 7/11 - Vancomycin (04/25/12)  >> 7/7  A:  1) Shock, cardiogenic vs septic (doubt) 2) Pneumonia: HCAP vs. aspiration P:   Dc ceftriaxone after July 11th dose Allow to dc No fevers  ENDOCRINE  Lab 04/28/12 0755 04/28/12 0420 04/28/12 0005 04/27/12 2009 04/25/12 2339  GLUCAP 107* 114* 153* 132* 154*   A:   No history of diabetes Controlled on own  NEUROLOGIC  A:   Following commands P:   Chair position if able Pt  BEST PRACTICE / DISPOSITION - Level of Care:  ICU - Primary Service:  Cardiology - Consultants:  CCM - Code Status:  DNR - Diet:  Start tube feeds today - DVT Px:  Heparin drip - GI Px:  Protonix - Skin Integrity:  Intact. - Social / Family:  Dnr/ dni  Will sign off, call if needed   Mcarthur Rossetti. Tyson Alias, MD, FACP Pgr: [phone] [place] [facility]

## 2012-05-01 NOTE — Addendum Note (Signed)
Addended by: Lisabeth Devoid F on: 05/01/2012 09:44 AM   Modules accepted: Orders

## 2012-05-02 LAB — CBC WITH DIFFERENTIAL/PLATELET
Basophils Absolute: 0 10*3/uL (ref 0.0–0.1)
Eosinophils Relative: 0 % (ref 0–5)
HCT: 30.7 % — ABNORMAL LOW (ref 39.0–52.0)
Hemoglobin: 9.8 g/dL — ABNORMAL LOW (ref 13.0–17.0)
Lymphocytes Relative: 11 % — ABNORMAL LOW (ref 12–46)
Lymphs Abs: 1.2 10*3/uL (ref 0.7–4.0)
MCV: 78.9 fL (ref 78.0–100.0)
Monocytes Absolute: 0.7 10*3/uL (ref 0.1–1.0)
Monocytes Relative: 7 % (ref 3–12)
Neutro Abs: 8.9 10*3/uL — ABNORMAL HIGH (ref 1.7–7.7)
RBC: 3.89 MIL/uL — ABNORMAL LOW (ref 4.22–5.81)
RDW: 18.5 % — ABNORMAL HIGH (ref 11.5–15.5)
WBC: 10.8 10*3/uL — ABNORMAL HIGH (ref 4.0–10.5)

## 2012-05-02 LAB — BASIC METABOLIC PANEL
CO2: 35 mEq/L — ABNORMAL HIGH (ref 19–32)
Chloride: 98 mEq/L (ref 96–112)
Creatinine, Ser: 0.72 mg/dL (ref 0.50–1.35)
GFR calc Af Amer: >90 mL/min (ref >90)
Potassium: 4.3 mEq/L (ref 3.5–5.1)

## 2012-05-02 LAB — CULTURE, BLOOD (ROUTINE X 2): Culture: NO GROWTH

## 2012-05-02 MED ORDER — FENTANYL CITRATE 0.05 MG/ML IJ SOLN
25.0000 ug | INTRAMUSCULAR | Status: DC | PRN
  Administered 2012-05-02 – 2012-05-05 (×13): 50 ug via INTRAVENOUS
  Filled 2012-05-02 (×13): qty 2

## 2012-05-02 MED ORDER — FUROSEMIDE 40 MG PO TABS
40.0000 mg | ORAL_TABLET | Freq: Two times a day (BID) | ORAL | Status: DC
  Administered 2012-05-02 – 2012-05-05 (×8): 40 mg via ORAL
  Filled 2012-05-02 (×9): qty 1

## 2012-05-02 MED ORDER — BOOST / RESOURCE BREEZE PO LIQD
1.0000 | Freq: Two times a day (BID) | ORAL | Status: DC
  Administered 2012-05-02 – 2012-05-16 (×25): 1 via ORAL
  Filled 2012-05-02: qty 1

## 2012-05-02 NOTE — Progress Notes (Signed)
Nutrition Follow-up  Intervention:   1. D/c Pro-stat from TF orders 2. Resource Breeze po BID, each supplement provides 250 kcal and 9 grams of protein. 3. RD will continue to follow     Assessment:   Pt was extubated on 7/8, pressors off shortly after. Diet was advanced to Heart healthy. Remains in neg fluid balance.  Pt states his appetite is good, breakfast tray was about 75% completed today. Reports no problems with chewing/swallowing or weight loss PTA.  RN reports pt has been drinking [employer], would like an order for it.   Diet Order:  Heart Healthy PO intake: none documented since extubation.   Meds: Scheduled Meds:   . a stronger pump book   Does not apply Once  . albuterol  2.5 mg Nebulization Q4H  . antiseptic oral rinse  15 mL Mouth Rinse BID  . aspirin EC  81 mg Oral Daily  . atorvastatin  20 mg Oral q1800  . calcium-vitamin D  1 tablet Oral BID  . chlorhexidine  15 mL Mouth/Throat BID  . clopidogrel  75 mg Oral Q breakfast  . docusate sodium  100 mg Oral BID  . feeding supplement  30 mL Per Tube QID  . ferrous sulfate  325 mg Oral Q breakfast  . furosemide  40 mg Oral BID  . heparin subcutaneous  5,000 Units Subcutaneous Q8H  . hydrocortisone sodium succinate  25 mg Intravenous Q6H  . ipratropium  0.5 mg Nebulization Q4H  . pantoprazole  40 mg Oral QHS  . sodium chloride  3 mL Intravenous Q12H  . DISCONTD: furosemide  40 mg Oral Daily  . DISCONTD: hydrocortisone sodium succinate  50 mg Intravenous Q6H   Continuous Infusions:   . DISCONTD: DOPamine 3 mcg/kg/min (04/30/12 0800)   PRN Meds:.sodium chloride, sodium chloride, acetaminophen, fentaNYL, ondansetron (ZOFRAN) IV, polyethylene glycol, sodium chloride  Labs:  CMP     Component Value Date/Time   NA 136 05/02/2012 0500   K 4.3 05/02/2012 0500   CL 98 05/02/2012 0500   CO2 35* 05/02/2012 0500   GLUCOSE 127* 05/02/2012 0500   BUN 23 05/02/2012 0500   CREATININE 0.72 05/02/2012 0500   CALCIUM 8.3*  05/02/2012 0500   PROT 8.2 04/25/2012 2248   ALBUMIN 2.6* 04/25/2012 2248   AST 77* 04/25/2012 2248   ALT 43 04/25/2012 2248   ALKPHOS 127* 04/25/2012 2248   BILITOT 1.6* 04/25/2012 2248   GFRNONAA 83* 05/02/2012 0500   GFRAA >90 05/02/2012 0500     Intake/Output Summary (Last 24 hours) at 05/02/12 0910 Last data filed at 05/02/12 0530  Gross per 24 hour  Intake    360 ml  Output    985 ml  Net   -625 ml    Weight Status:  Remains above admission weight despite negative fluid balance 7/12 - 173 lbs 7/9 - 164 lbs 7/8 - 162 lbs 7/7 - 173 lbs  Re-estimated needs:  1600-1800 kcal, 80-90 gm protein   Nutrition Dx:  Inadequate oral intake, resolving    Goal: En goal no longer applicable New Goal: PO intake of meals and supplements to meet >90% of estimated nutrition needs   Monitor:  PO intake, weight, labs, I/O's  Clarene Duke RD, LDN Pager [phone] After Hours pager [phone]

## 2012-05-02 NOTE — Progress Notes (Addendum)
Subjective:  Less dyspneic.  No chest pain. Chronic back pain and xray of chest yesterday shows new  Midthoracic compression fracture since Sept 2012. States has not ambulated yet. Will ask PT to see.   Objective:  Vital Signs in the last 24 hours: Temp:  [97.9 F (36.6 C)-98.1 F (36.7 C)] 97.9 F (36.6 C) (07/12 0400) Pulse Rate:  [66-80] 70  (07/12 0600) Resp:  [14-31] 14  (07/12 0600) BP: (111-149)/(47-64) 136/62 mmHg (07/12 0600) SpO2:  [93 %-99 %] 98 % (07/12 0731) Weight:  [173 lb 8 oz (78.7 kg)] 173 lb 8 oz (78.7 kg) (07/12 0500)  Intake/Output from previous day: 07/11 0701 - 07/12 0700 In: 600 [P.O.:600] Out: 985 [Urine:985] Intake/Output from this shift:       . a stronger pump book   Does not apply Once  . albuterol  2.5 mg Nebulization Q4H  . antiseptic oral rinse  15 mL Mouth Rinse BID  . aspirin EC  81 mg Oral Daily  . atorvastatin  20 mg Oral q1800  . calcium-vitamin D  1 tablet Oral BID  . chlorhexidine  15 mL Mouth/Throat BID  . clopidogrel  75 mg Oral Q breakfast  . docusate sodium  100 mg Oral BID  . feeding supplement  30 mL Per Tube QID  . ferrous sulfate  325 mg Oral Q breakfast  . furosemide  40 mg Oral Daily  . heparin subcutaneous  5,000 Units Subcutaneous Q8H  . hydrocortisone sodium succinate  25 mg Intravenous Q6H  . ipratropium  0.5 mg Nebulization Q4H  . pantoprazole  40 mg Oral QHS  . sodium chloride  3 mL Intravenous Q12H  . DISCONTD: furosemide  20 mg Intravenous BID  . DISCONTD: hydrocortisone sodium succinate  50 mg Intravenous Q6H      . DISCONTD: DOPamine 3 mcg/kg/min (04/30/12 0800)    Physical Exam: The patient appears to be in no distress.  Chest: expiratory rhonchi bilateral  Heart: gr 2/6 systolic murmur at base and apex. Abdomen nontender. Bowel sounds present. Constipated. Extremities no edema. Neuro: alert Lab Results:  Basename 05/02/12 0500  WBC 10.8*  HGB 9.8*  PLT 105*    Basename 05/02/12 0500  05/01/12 0909  NA 136 138  K 4.3 4.0  CL 98 99  CO2 35* 32  GLUCOSE 127* 249*  BUN 23 25*  CREATININE 0.72 0.67   No results found for this basename: TROPONINI:2,CK,MB:2 in the last 72 hours Hepatic Function Panel No results found for this basename: PROT,ALBUMIN,AST,ALT,ALKPHOS,BILITOT,BILIDIR,IBILI in the last 72 hours No results found for this basename: CHOL in the last 72 hours No results found for this basename: PROTIME in the last 72 hours  Imaging: Dg Chest Port 1 View  05/01/2012  *RADIOLOGY REPORT*  Clinical Data: Congestive heart failure.  PORTABLE CHEST - 1 VIEW  Comparison: Chest x-rays dated 07/10, 07/09, 07/08, and 04/27/2012  Findings: Chronic cardiomegaly.  Increased elevation of the left hemidiaphragm.  The pulmonary vascularity is now normal.  There is a moderate right pleural effusion with atelectasis at the right lung base, unchanged.  The patient does have a severe  compression fracture in the mid thoracic spine, new since 07/20/2011.  IMPRESSION: Resolving interstitial edema.  Pulmonary vascularity is now normal.  Persistent right effusion and atelectasis.  Compression fracture of the mid thoracic spine is new since 07/20/2011.  Original Report Authenticated By: Gwynn Burly, M.D.    Cardiac Studies:  Assessment/Plan:  Labile BP  Stable off dopamine Fluid balance     Probable euvolemic now.  Weight up.  Follow closely.  Will  give higher dose of oral lasix 40 mg BID and follow BMET.  Creat 0.72  Ischemic heart disease No chest pain.  Continue ASA and plavix.  Respiratory failure: Some of his previous CO2 narcosis and hypercapnia may be due to his chest cage deformity which mechanically limits his respiratory effort (loss of thoracic spine height from compression fractures and spine deformity etc). Has new compression fracture further shortening chest height.  Constipation: responding to miralax.  Plan: Advance diet and activity today. PT/OT Daily BMET.   Possibly home early next week if continues to improve.  Addendum: After rounds today I did talk to his wife who hopes that he will be able to go to a skilled nursing facility for additional physical therapy and strengthening prior to returning home.  LOS: 9 days    Cassell Clement 05/02/2012, 8:18 AM

## 2012-05-03 LAB — BASIC METABOLIC PANEL
BUN: 23 mg/dL (ref 6–23)
CO2: 34 mEq/L — ABNORMAL HIGH (ref 19–32)
Calcium: 8.4 mg/dL (ref 8.4–10.5)
Chloride: 102 mEq/L (ref 96–112)
Creatinine, Ser: 0.65 mg/dL (ref 0.50–1.35)
Glucose, Bld: 118 mg/dL — ABNORMAL HIGH (ref 70–99)

## 2012-05-03 MED ORDER — LOSARTAN POTASSIUM 25 MG PO TABS
25.0000 mg | ORAL_TABLET | Freq: Every day | ORAL | Status: DC
  Administered 2012-05-03: 25 mg via ORAL
  Filled 2012-05-03 (×2): qty 1

## 2012-05-03 NOTE — Progress Notes (Signed)
PROGRESS NOTE  Subjective:   Pt is an 78 yo admitted with lethergy.  Found to have AF with RVR and systolic CHF - EF 25%.  Hx of COPD.   Objective:    Vital Signs:   Temp:  [97.9 F (36.6 C)-98.1 F (36.7 C)] 98.1 F (36.7 C) (07/13 0001) Pulse Rate:  [63-77] 74  (07/13 0600) Resp:  [13-28] 24  (07/13 0600) BP: (89-145)/(47-99) 119/56 mmHg (07/13 0500) SpO2:  [92 %-98 %] 95 % (07/13 0722) Weight:  [174 lb 6.1 oz (79.1 kg)] 174 lb 6.1 oz (79.1 kg) (07/13 0500)  Last BM Date: 05/02/12   24-hour weight change: Weight change: 14.1 oz (0.4 kg)  Weight trends: Filed Weights   04/29/12 0500 05/02/12 0500 05/03/12 0500  Weight: 164 lb 3.9 oz (74.5 kg) 173 lb 8 oz (78.7 kg) 174 lb 6.1 oz (79.1 kg)    Intake/Output:  07/12 0701 - 07/13 0700 In: 1100 [P.O.:1100] Out: 1000 [Urine:1000]     Physical Exam: BP 119/56  Pulse 74  Temp 98.1 F (36.7 C) (Oral)  Resp 24  Ht 5\' 2"  (1.575 m)  Wt 174 lb 6.1 oz (79.1 kg)  BMI 31.90 kg/m2  SpO2 95%    Currently 05/03/2012 7:50 AM  HR =78, BP = 135/78 General: Vital signs reviewed and noted. Well-developed, well-nourished, in no acute distress; alert, appropriate and cooperative . Hoarse voice  Head: Normocephalic, atraumatic.  Eyes: conjunctivae/corneas clear.  EOM's intact.   Throat: normal  Neck: Supple. Normal carotids. No JVD  Lungs:  Decreased breath sounds  Heart: Regular rate,  With normal  S1 S2. No murmurs, gallops or rubs  Abdomen:  Soft, non-tender, non-distended with normoactive bowel sounds. No hepatomegaly. No rebound/guarding. No abdominal masses.  Extremities: Distal pedal pulses are 2+ .  No edema.    Neurologic: A&O X3, CN II - XII are grossly intact. Motor strength is 5/5 in the all 4 extremities.  Psych: Responds to questions appropriately with normal affect.    Labs: BMET:  Basename 05/03/12 0512 05/02/12 0500  NA 139 136  K 3.9 4.3  CL 102 98  CO2 34* 35*  GLUCOSE 118* 127*  BUN 23 23    CREATININE 0.65 0.72  CALCIUM 8.4 8.3*  MG -- --  PHOS -- --    Liver function tests: No results found for this basename: AST:2,ALT:2,ALKPHOS:2,BILITOT:2,PROT:2,ALBUMIN:2 in the last 72 hours No results found for this basename: LIPASE:2,AMYLASE:2 in the last 72 hours  CBC:  Basename 05/02/12 0500  WBC 10.8*  NEUTROABS 8.9*  HGB 9.8*  HCT 30.7*  MCV 78.9  PLT 105*    Cardiac Enzymes: No results found for this basename: CKTOTAL:4,CKMB:4,TROPONINI:4 in the last 72 hours  Coagulation Studies: No results found for this basename: LABPROT:5,INR:5 in the last 72 hours  Other:  Tele:  NSR  Medications:    Infusions:    Scheduled Medications:    . a stronger pump book   Does not apply Once  . albuterol  2.5 mg Nebulization Q4H  . antiseptic oral rinse  15 mL Mouth Rinse BID  . aspirin EC  81 mg Oral Daily  . atorvastatin  20 mg Oral q1800  . calcium-vitamin D  1 tablet Oral BID  . clopidogrel  75 mg Oral Q breakfast  . docusate sodium  100 mg Oral BID  . feeding supplement  1 Container Oral BID BM  . ferrous sulfate  325 mg Oral Q breakfast  . furosemide  40 mg Oral BID  . heparin subcutaneous  5,000 Units Subcutaneous Q8H  . hydrocortisone sodium succinate  25 mg Intravenous Q6H  . ipratropium  0.5 mg Nebulization Q4H  . pantoprazole  40 mg Oral QHS  . sodium chloride  3 mL Intravenous Q12H  . DISCONTD: chlorhexidine  15 mL Mouth/Throat BID  . DISCONTD: feeding supplement  30 mL Per Tube QID  . DISCONTD: furosemide  40 mg Oral Daily    Assessment/ Plan:   1.  Congestive heart failure with left ventricular systolic dysfunction (08/07/2011)  he slowly seems to be improving Creatinine is OK .  Hx of cough with Lisinopril. Will add Losartan 25 QD.   Will try to add low dose Coreg tomorrow.  I note that he has had hypotension , bradycardia, and has COPD - it may be difficult to get him on Coreg.  Ischemic heart disease (03/20/2011) No cp  COPD (chronic  obstructive pulmonary disease) (10/18/2011) Stable.  Plans per PCCM.  Atrial fibrillation (04/24/2012) He has converted back to NSR  Respiratory failure with hypercapnia (04/24/2012) Plans per PCCM   Disposition: keep in ICU today. Possible transfer tomorrow. Length of Stay: 10  Vesta Mixer, Montez Hageman., MD, [facility] 05/03/2012, 7:45 AM Office [phone] Pager [phone]

## 2012-05-03 NOTE — Evaluation (Signed)
Physical Therapy Evaluation Patient Details Name: Trevor [name] MRN: [id number] DOB: Feb 23, 1925 Today's Date: 05/03/2012 Time: 4098-1191 PT Time Calculation (min): 20 min  PT Assessment / Plan / Recommendation Clinical Impression  Patient is an 78 yo male admitted with lethargy and new onset Afib.  Patient was intubated 7/5 in respiratory failure, and extubated 7/8. Patient remains very weak with decreased mobility and decreased balance/safety with gait.  Recommend ST-SNF for continued therapy at discharge prior to returning home.  Will follow acutely for mobility/gait training and education.    PT Assessment  Patient needs continued PT services    Follow Up Recommendations  Skilled nursing facility    Barriers to Discharge Decreased caregiver support      Equipment Recommendations  Defer to next venue    Recommendations for Other Services     Frequency Min 3X/week    Precautions / Restrictions Precautions Precautions: Fall Restrictions Weight Bearing Restrictions: No         Mobility  Transfers Transfers: Sit to Stand;Stand to Sit Sit to Stand: 3: Mod assist;With upper extremity assist;With armrests;From chair/3-in-1 Stand to Sit: 3: Mod assist;With upper extremity assist;With armrests;To chair/3-in-1 Details for Transfer Assistance: Cues to scoot to edge of chair and use armrests before standing.  Mod assist to initiate standing.  +1 for lines. Ambulation/Gait Ambulation/Gait Assistance: 1: +2 Total assist Ambulation/Gait: Patient Percentage: 70% Ambulation Distance (Feet): 18 Feet Assistive device: Rolling walker Ambulation/Gait Assistance Details: Cues to stand tall and look up for obstacles in environment.  Verbal cues for safe use of RW.  +2 for lines and to follow with chair. Gait Pattern: Step-to pattern;Decreased stride length;Shuffle;Trunk flexed;Wide base of support    Exercises     PT Diagnosis: Difficulty walking;Generalized weakness  PT Problem List:  Decreased strength;Decreased activity tolerance;Decreased balance;Decreased mobility;Decreased knowledge of use of DME;Cardiopulmonary status limiting activity PT Treatment Interventions: DME instruction;Gait training;Functional mobility training;Therapeutic activities;Therapeutic exercise;Patient/family education   PT Goals Acute Rehab PT Goals PT Goal Formulation: With patient Time For Goal Achievement: 05/10/12 Potential to Achieve Goals: Good Pt will go Supine/Side to Sit: with supervision PT Goal: Supine/Side to Sit - Progress: Goal set today Pt will go Sit to Supine/Side: with supervision PT Goal: Sit to Supine/Side - Progress: Goal set today Pt will go Sit to Stand: with supervision;with upper extremity assist PT Goal: Sit to Stand - Progress: Goal set today Pt will go Stand to Sit: with supervision;with upper extremity assist PT Goal: Stand to Sit - Progress: Goal set today Pt will Ambulate: 51 - 150 feet;with supervision;with rolling walker PT Goal: Ambulate - Progress: Goal set today  Visit Information  Last PT Received On: 05/03/12 Assistance Needed: +2    Subjective Data  Subjective: My back hurts sitting in this chair Patient Stated Goal: To be able to return home soon   Prior Functioning  Home Living Lives With: Spouse Available Help at Discharge: Family;Available PRN/intermittently Type of Home: House Home Access: Level entry Home Layout: Two level;Able to live on main level with bedroom/bathroom Bathroom Shower/Tub: Engineer, manufacturing systems: Standard Bathroom Accessibility: Yes How Accessible: Accessible via walker Home Adaptive Equipment: Straight cane Prior Function Level of Independence: Independent with assistive device(s) (Used cane for ambulation pta) Driving: Yes Vocation: Retired Comments: Wife works.  Is home alone during day. Communication Communication: No difficulties    Cognition  Overall Cognitive Status: Appears within functional  limits for tasks assessed/performed Arousal/Alertness: Awake/alert Orientation Level: Appears intact for tasks assessed Behavior During Session: [facility]  for tasks performed    Extremity/Trunk Assessment Right Upper Extremity Assessment RUE ROM/Strength/Tone: [facility] for tasks assessed Left Upper Extremity Assessment LUE ROM/Strength/Tone: WFL for tasks assessed Right Lower Extremity Assessment RLE ROM/Strength/Tone: Deficits RLE ROM/Strength/Tone Deficits: General weakness Left Lower Extremity Assessment LLE ROM/Strength/Tone: Deficits LLE ROM/Strength/Tone Deficits: General weakness Trunk Assessment Trunk Assessment: Kyphotic   Balance    End of Session PT - End of Session Equipment Utilized During Treatment: Gait belt;Oxygen Activity Tolerance: Patient limited by fatigue Patient left: in chair;with call bell/phone within reach Nurse Communication: Mobility status  GP     Vena Austria 05/03/2012, 1:15 PM Durenda Hurt. Renaldo Fiddler, [facility] Acute Rehab Services Pager [phone]

## 2012-05-04 DIAGNOSIS — E876 Hypokalemia: Secondary | ICD-10-CM

## 2012-05-04 LAB — BASIC METABOLIC PANEL
BUN: 23 mg/dL (ref 6–23)
CO2: 32 mEq/L (ref 19–32)
Calcium: 7.9 mg/dL — ABNORMAL LOW (ref 8.4–10.5)
Chloride: 98 mEq/L (ref 96–112)
Creatinine, Ser: 0.65 mg/dL (ref 0.50–1.35)
GFR calc Af Amer: >90 mL/min (ref >90)
GFR calc non Af Amer: 87 mL/min — ABNORMAL LOW (ref >90)
Glucose, Bld: 203 mg/dL — ABNORMAL HIGH (ref 70–99)
Potassium: 3 mEq/L — ABNORMAL LOW (ref 3.5–5.1)
Sodium: 136 mEq/L (ref 135–145)

## 2012-05-04 MED ORDER — CARVEDILOL 6.25 MG PO TABS
6.2500 mg | ORAL_TABLET | Freq: Two times a day (BID) | ORAL | Status: DC
  Administered 2012-05-04 – 2012-05-05 (×2): 6.25 mg via ORAL
  Filled 2012-05-04 (×6): qty 1

## 2012-05-04 MED ORDER — FERROUS GLUCONATE 324 (38 FE) MG PO TABS: 325.0000 mg | ORAL_TABLET | Freq: Every day | ORAL | Status: DC

## 2012-05-04 MED ORDER — IPRATROPIUM BROMIDE 0.02 % IN SOLN
0.5000 mg | Freq: Four times a day (QID) | RESPIRATORY_TRACT | Status: DC
  Administered 2012-05-05 – 2012-05-06 (×8): 0.5 mg via RESPIRATORY_TRACT
  Filled 2012-05-04 (×8): qty 2.5

## 2012-05-04 MED ORDER — ADULT MULTIVITAMIN W/MINERALS CH
1.0000 | ORAL_TABLET | Freq: Every day | ORAL | Status: DC
  Administered 2012-05-04 – 2012-05-05 (×2): 1 via ORAL
  Filled 2012-05-04 (×3): qty 1

## 2012-05-04 MED ORDER — LOSARTAN POTASSIUM 50 MG PO TABS
50.0000 mg | ORAL_TABLET | Freq: Every day | ORAL | Status: DC
  Administered 2012-05-04 – 2012-05-05 (×2): 50 mg via ORAL
  Filled 2012-05-04 (×3): qty 1

## 2012-05-04 MED ORDER — SPIRONOLACTONE 12.5 MG HALF TABLET: 12.5000 mg | ORAL_TABLET | ORAL | Status: DC

## 2012-05-04 MED ORDER — TIOTROPIUM BROMIDE MONOHYDRATE 18 MCG IN CAPS
18.0000 ug | ORAL_CAPSULE | Freq: Every day | RESPIRATORY_TRACT | Status: DC
  Administered 2012-05-05 – 2012-05-19 (×12): 18 ug via RESPIRATORY_TRACT
  Filled 2012-05-04 (×3): qty 5

## 2012-05-04 MED ORDER — ACETAMINOPHEN 325 MG PO TABS: 650.0000 mg | ORAL_TABLET | Freq: Four times a day (QID) | ORAL | Status: DC | PRN

## 2012-05-04 MED ORDER — ATORVASTATIN CALCIUM 20 MG PO TABS: 20.0000 mg | ORAL_TABLET | Freq: Every day | ORAL | Status: DC

## 2012-05-04 MED ORDER — CLOPIDOGREL BISULFATE 75 MG PO TABS: 75.0000 mg | ORAL_TABLET | Freq: Every day | ORAL | Status: DC

## 2012-05-04 MED ORDER — POTASSIUM CHLORIDE CRYS ER 20 MEQ PO TBCR: 20.0000 meq | EXTENDED_RELEASE_TABLET | Freq: Two times a day (BID) | ORAL | Status: DC

## 2012-05-04 MED ORDER — CHLORDIAZEPOXIDE-AMITRIPTYLINE 10-25 MG PO TABS
1.0000 | ORAL_TABLET | Freq: Every day | ORAL | Status: DC
Start: 2012-05-04 — End: 2012-05-04

## 2012-05-04 MED ORDER — NITROGLYCERIN 0.4 MG SL SUBL: 0.4000 mg | SUBLINGUAL_TABLET | SUBLINGUAL | Status: DC | PRN

## 2012-05-04 MED ORDER — ALBUTEROL SULFATE (5 MG/ML) 0.5% IN NEBU
2.5000 mg | INHALATION_SOLUTION | Freq: Four times a day (QID) | RESPIRATORY_TRACT | Status: DC
  Administered 2012-05-05 – 2012-05-06 (×8): 2.5 mg via RESPIRATORY_TRACT
  Filled 2012-05-04 (×7): qty 0.5

## 2012-05-04 MED ORDER — ALBUTEROL SULFATE (5 MG/ML) 0.5% IN NEBU: 2.5000 mg | INHALATION_SOLUTION | RESPIRATORY_TRACT | Status: DC | PRN

## 2012-05-04 MED ORDER — SPIRONOLACTONE 12.5 MG HALF TABLET
12.5000 mg | ORAL_TABLET | Freq: Every day | ORAL | Status: DC
  Administered 2012-05-04 – 2012-05-05 (×2): 12.5 mg via ORAL
  Filled 2012-05-04 (×3): qty 1

## 2012-05-04 MED ORDER — NITROGLYCERIN 0.4 MG/HR TD PT24
0.4000 mg | MEDICATED_PATCH | Freq: Every day | TRANSDERMAL | Status: DC
  Administered 2012-05-04 – 2012-05-05 (×2): 0.4 mg via TRANSDERMAL
  Filled 2012-05-04 (×4): qty 1

## 2012-05-04 MED ORDER — CALCIUM CARBONATE 600 MG PO TABS: 600.0000 mg | ORAL_TABLET | Freq: Two times a day (BID) | ORAL | Status: DC

## 2012-05-04 MED ORDER — ASPIRIN EC 325 MG PO TBEC: 325.0000 mg | DELAYED_RELEASE_TABLET | Freq: Every day | ORAL | Status: DC

## 2012-05-04 MED ORDER — AMITRIPTYLINE HCL 25 MG PO TABS
25.0000 mg | ORAL_TABLET | Freq: Every day | ORAL | Status: DC
  Administered 2012-05-04 – 2012-05-05 (×2): 25 mg via ORAL
  Filled 2012-05-04 (×3): qty 1

## 2012-05-04 MED ORDER — FENTANYL 25 MCG/HR TD PT72
75.0000 ug | MEDICATED_PATCH | TRANSDERMAL | Status: DC
  Administered 2012-05-04: 75 ug via TRANSDERMAL
  Filled 2012-05-04: qty 1

## 2012-05-04 MED ORDER — FUROSEMIDE 40 MG PO TABS: 40.0000 mg | ORAL_TABLET | Freq: Two times a day (BID) | ORAL | Status: DC

## 2012-05-04 MED ORDER — ATORVASTATIN CALCIUM 20 MG PO TABS
20.0000 mg | ORAL_TABLET | Freq: Every day | ORAL | Status: DC
  Administered 2012-05-04 – 2012-05-18 (×13): 20 mg via ORAL
  Filled 2012-05-04 (×16): qty 1

## 2012-05-04 MED ORDER — POTASSIUM CHLORIDE CRYS ER 20 MEQ PO TBCR
20.0000 meq | EXTENDED_RELEASE_TABLET | Freq: Two times a day (BID) | ORAL | Status: DC
  Administered 2012-05-04 – 2012-05-05 (×4): 20 meq via ORAL
  Filled 2012-05-04 (×6): qty 1

## 2012-05-04 NOTE — Progress Notes (Signed)
PROGRESS NOTE  Subjective:   Pt is an 78 yo admitted with lethergy.  Found to have AF with RVR and systolic CHF - EF 25%.  Hx of COPD.  He has chronic back pain and a compression fx in his spine .  The Fentanyl is not holding him for very long.    Nursing has requested a Fentanyl patch.   Objective:    Vital Signs:   Temp:  [97.5 F (36.4 C)-98.5 F (36.9 C)] 97.9 F (36.6 C) (07/14 0400) Pulse Rate:  [66-81] 66  (07/14 0600) Resp:  [19-28] 24  (07/14 0600) BP: (103-130)/(38-73) 108/43 mmHg (07/14 0500) SpO2:  [88 %-99 %] 93 % (07/14 0600) FiO2 (%):  [2 %] 2 % (07/13 1700) Weight:  [174 lb 9.7 oz (79.2 kg)] 174 lb 9.7 oz (79.2 kg) (07/14 0447)  Last BM Date: 05/03/12   24-hour weight change: Weight change: 3.5 oz (0.1 kg)  Weight trends: Filed Weights   05/02/12 0500 05/03/12 0500 05/04/12 0447  Weight: 173 lb 8 oz (78.7 kg) 174 lb 6.1 oz (79.1 kg) 174 lb 9.7 oz (79.2 kg)    Intake/Output:  07/13 0701 - 07/14 0700 In: 1260 [P.O.:1260] Out: 1725 [Urine:1725]     Physical Exam: BP 108/43  Pulse 66  Temp 97.9 F (36.6 C) (Oral)  Resp 24  Ht 5\' 2"  (1.575 m)  Wt 174 lb 9.7 oz (79.2 kg)  BMI 31.94 kg/m2  SpO2 93%    Currently 05/04/2012 7:48 AM  HR =78, BP = 135/78 General: Vital signs reviewed and noted. Well-developed, well-nourished, in no acute distress; alert, appropriate and cooperative . Hoarse voice  Head: Normocephalic, atraumatic.  Eyes: conjunctivae/corneas clear.  EOM's intact.   Throat: normal  Neck: Supple. Normal carotids. No JVD  Lungs:  Decreased breath sounds. Consolidation in right base. E to A changes, rales in left base.  Heart: Regular rate,  With normal  S1 S2. No murmurs, gallops or rubs  Abdomen:  Soft, non-tender, non-distended with normoactive bowel sounds. No hepatomegaly. No rebound/guarding. No abdominal masses.  Extremities: Distal pedal pulses are 2+ .  No edema.    Neurologic: A&O X3, CN II - XII are grossly intact. Motor  strength is 5/5 in the all 4 extremities.  Psych: Responds to questions appropriately with normal affect.    Labs: BMET:  Basename 05/04/12 0500 05/03/12 0512  NA 136 139  K 3.0* 3.9  CL 98 102  CO2 32 34*  GLUCOSE 203* 118*  BUN 23 23  CREATININE 0.65 0.65  CALCIUM 7.9* 8.4  MG -- --  PHOS -- --    Liver function tests: No results found for this basename: AST:2,ALT:2,ALKPHOS:2,BILITOT:2,PROT:2,ALBUMIN:2 in the last 72 hours No results found for this basename: LIPASE:2,AMYLASE:2 in the last 72 hours  CBC:  Basename 05/02/12 0500  WBC 10.8*  NEUTROABS 8.9*  HGB 9.8*  HCT 30.7*  MCV 78.9  PLT 105*    Cardiac Enzymes: No results found for this basename: CKTOTAL:4,CKMB:4,TROPONINI:4 in the last 72 hours  Coagulation Studies: No results found for this basename: LABPROT:5,INR:5 in the last 72 hours  Other:  Tele:  NSR  Medications:    Infusions:    Scheduled Medications:    . a stronger pump book   Does not apply Once  . albuterol  2.5 mg Nebulization Q4H  . antiseptic oral rinse  15 mL Mouth Rinse BID  . aspirin EC  81 mg Oral Daily  . atorvastatin  20  mg Oral q1800  . calcium-vitamin D  1 tablet Oral BID  . clopidogrel  75 mg Oral Q breakfast  . docusate sodium  100 mg Oral BID  . feeding supplement  1 Container Oral BID BM  . ferrous sulfate  325 mg Oral Q breakfast  . furosemide  40 mg Oral BID  . heparin subcutaneous  5,000 Units Subcutaneous Q8H  . hydrocortisone sodium succinate  25 mg Intravenous Q6H  . ipratropium  0.5 mg Nebulization Q4H  . losartan  25 mg Oral Daily  . pantoprazole  40 mg Oral QHS  . sodium chloride  3 mL Intravenous Q12H    Assessment/ Plan:   1.  Congestive heart failure with left ventricular systolic dysfunction (08/07/2011)  he slowly seems to be improving Creatinine is OK .  Hx of cough with Lisinopril. Will increase Losartan to 50 QD.  His renal insufficiency has resolved.  HR is 59.    I note that he has had  hypotension , bradycardia, and has COPD - it may be difficult to get him on Coreg.  Ischemic heart disease (03/20/2011) No cp  COPD (chronic obstructive pulmonary disease) (10/18/2011) Stable.  Plans per PCCM.  Atrial fibrillation (04/24/2012) He has converted back to NSR  Respiratory failure with hypercapnia (04/24/2012) Plans per PCCM  Hypokalemia:  Will replace.  In lasix BID   Disposition: transfer to tele today.  DC central line, peripheral IV,  Length of Stay: 11  Vesta Mixer, Montez Hageman., MD, [facility] 05/04/2012, 7:48 AM Office [phone] Pager [phone]

## 2012-05-04 NOTE — Progress Notes (Addendum)
Clinical Social Work Department CLINICAL SOCIAL WORK PLACEMENT NOTE 05/04/2012  Patient:  Trevor [name], Trevor [name]  Account Number:  [id number] Admit date:  04/23/2012  Clinical Social Worker:  ,  Date/time:  05/04/2012 12:07 PM  Clinical Social Work is seeking post-discharge placement for this patient at the following level of care:   SKILLED NURSING   (*CSW will update this form in Epic as items are completed)   05/04/2012  Patient/family provided with [facility] Department of Clinical Social Work's list of facilities offering this level of care within the geographic area requested by the patient (or if unable, by the patient's family).  05/04/2012  Patient/family informed of their freedom to choose among providers that offer the needed level of care, that participate in Medicare, Medicaid or managed care program needed by the patient, have an available bed and are willing to accept the patient.  05/04/2012  Patient/family informed of [facility]' ownership interest in [facility], as well as of the fact that they are under no obligation to receive care at this facility.  08/16/11 PASARR submitted to EDS on  08/16/11 PASARR number received from EDS on   05/04/12 FL2 transmitted to all facilities in geographic area requested by pt/family on   FL2 transmitted to all facilities within larger geographic area on   Patient informed that his/her managed care company has contracts with or will negotiate with  certain facilities, including the following:     Patient/family informed of bed offers received:   Patient chooses bed at  Physician recommends and patient chooses bed at    Patient to be transferred to  on   Patient to be transferred to facility by   The following physician request were entered in Epic:   Additional Comments:

## 2012-05-04 NOTE — Progress Notes (Signed)
Clinical Social Work Department BRIEF PSYCHOSOCIAL ASSESSMENT 05/04/2012  Patient:  Trevor [name], Trevor [name]     Account Number:  [id number]     Admit date:  04/23/2012  Clinical Social Worker: Lia Foyer, MSW, [place]  ,  Date/Time:  05/04/2012 11:53 AM  Referred by:  RN  Date Referred:  05/04/2012 Referred for  SNF Placement   Other Referral:   Interview type:  Family Other interview type:    PSYCHOSOCIAL DATA Living Status:  WIFE Admitted from facility:  Lived at home with spouse prior to admission. Level of care:   Primary support name:  Trevor [name] Primary support relationship to patient:  SPOUSE Degree of support available:   Strong and vested in treatment plan.    CURRENT CONCERNS  Other Concerns:    SOCIAL WORK ASSESSMENT / PLAN CSW asked to consult with patient per RN. CSW provided education on SNF placements in [place] to patient and spouse. Patient identified his number one facility would be [facility] (previously a resident), and he was willing to send his information to all of  [facility] as a back up. Patient and spouse were agreeable to the discharge plan.   Assessment/plan status:  Information/Referral to [facility] Other assessment/ plan:   Information/referral to community resources:    PATIENT'S/FAMILY'S RESPONSE TO PLAN OF CARE: Patient and spouse thanked Child psychotherapist for starting SNF placement, and agree with the discharge plan.    Lia Foyer, LCSWA [facility] Clinical Social Worker Contact #: [phone] (weekend)

## 2012-05-05 DIAGNOSIS — I2589 Other forms of chronic ischemic heart disease: Secondary | ICD-10-CM

## 2012-05-05 LAB — BASIC METABOLIC PANEL
BUN: 21 mg/dL (ref 6–23)
Creatinine, Ser: 0.7 mg/dL (ref 0.50–1.35)
GFR calc non Af Amer: 84 mL/min — ABNORMAL LOW (ref >90)
Glucose, Bld: 139 mg/dL — ABNORMAL HIGH (ref 70–99)
Potassium: 3.7 mEq/L (ref 3.5–5.1)

## 2012-05-05 NOTE — Progress Notes (Signed)
Pts. Heart rate brady down to 39 not sustained.  Pt. Asleep and asymptomatic.  Will continue to monitor patient.

## 2012-05-05 NOTE — Progress Notes (Signed)
Physical Therapy Treatment Patient Details Name: Trevor [name] MRN: [id number] DOB: 06-29-1925 Today's Date: 05/05/2012 Time: 4696-2952 PT Time Calculation (min): 16 min  PT Assessment / Plan / Recommendation Comments on Treatment Session  Patient s/p Afib with RVR, COPD and compression fx. with decr mobility secondary to decr endurance and decr balance.  Will benefit from short term NHP.      Follow Up Recommendations  Skilled nursing facility;Supervision/Assistance - 24 hour    Barriers to Discharge        Equipment Recommendations  Defer to next venue    Recommendations for Other Services    Frequency Min 3X/week   Plan Discharge plan remains appropriate;Frequency remains appropriate    Precautions / Restrictions Precautions Precautions: Fall Restrictions Weight Bearing Restrictions: No   Pertinent Vitals/Pain VSS, Some pain    Mobility  Bed Mobility Bed Mobility: Not assessed Transfers Transfers: Sit to Stand;Stand to Sit Sit to Stand: 4: Min guard;With upper extremity assist;From bed Stand to Sit: 4: Min guard;With upper extremity assist;To chair/3-in-1;With armrests Details for Transfer Assistance: cues for hand placement. Ambulation/Gait Ambulation/Gait Assistance: 4: Min assist Ambulation Distance (Feet): 120 Feet Assistive device: Rolling walker Ambulation/Gait Assistance Details: cues to stand tall and look up for obstacles in environment.  Verbal cues for safe use of RW.   Gait Pattern: Step-to pattern;Decreased stride length;Shuffle;Trunk flexed;Wide base of support Stairs: No Wheelchair Mobility Wheelchair Mobility: No    PT Goals Acute Rehab PT Goals PT Goal: Supine/Side to Sit - Progress: Progressing toward goal PT Goal: Sit to Supine/Side - Progress: Progressing toward goal PT Goal: Sit to Stand - Progress: Progressing toward goal PT Goal: Stand to Sit - Progress: Progressing toward goal PT Goal: Ambulate - Progress: Progressing toward  goal  Visit Information  Last PT Received On: 05/05/12 Assistance Needed: +2    Subjective Data  Subjective: "I would like to walk."   Cognition  Overall Cognitive Status: Appears within functional limits for tasks assessed/performed Arousal/Alertness: Awake/alert Orientation Level: Appears intact for tasks assessed Behavior During Session: [facility] for tasks performed    Balance  Balance Balance Assessed: Yes Static Standing Balance Static Standing - Balance Support: Bilateral upper extremity supported;During functional activity Static Standing - Level of Assistance: 5: Stand by assistance Static Standing - Comment/# of Minutes: 2 minutes.  Placed patient's shoes on per his request  End of Session PT - End of Session Equipment Utilized During Treatment: Gait belt Activity Tolerance: Patient tolerated treatment well Patient left: in chair;with call bell/phone within reach Nurse Communication: Mobility status       INGOLD,Kaliah Haddaway 05/05/2012, 3:04 PM  [facility] Acute Rehabilitation [phone] (pager)

## 2012-05-05 NOTE — Progress Notes (Signed)
Clinical Social Worker followed up with patient regarding the bed offers received. Patient's preferred facility, [facility] did not have any male beds available. CSW encouraged patient to choose an alternative facility. Patient asked about possibly going home with home health services, since his preferred facility did not have any availability. CSW left a message with patient's wife regarding the bed offers received and asked for wife to return call.   Rozetta Nunnery MSW, [employer] [phone]

## 2012-05-05 NOTE — Progress Notes (Signed)
Patients BP is 84/48 manually. Patient assessed. Asymptomatic. Currently sitting up, eating dinner. PA on-call notified. Orders to hold evening Coreg and remove nitroglycerin patch. Also ordered to move Lasix to later time. If SBP goes above 100 can give later this evening. Will continue to monitor. Natina Wiginton, Melida Quitter

## 2012-05-05 NOTE — Progress Notes (Addendum)
PROGRESS NOTE  Subjective:   Pt is an 78 yo admitted with lethergy.  Found to have AF with RVR and systolic CHF - EF 25%.  Hx of COPD.  He has chronic back pain and a compression fx in his spine . Today, his pain is improved.  He denies CP or SOB.  No new concerns.  Objective:    Vital Signs:   Temp:  [97.1 F (36.2 C)-97.8 F (36.6 C)] 97.8 F (36.6 C) (07/15 0703) Pulse Rate:  [65-71] 67  (07/15 0703) Resp:  [18-19] 18  (07/15 0703) BP: (99-131)/(46-81) 122/65 mmHg (07/15 0703) SpO2:  [90 %-100 %] 90 % (07/15 0703) Weight:  [171 lb 15.3 oz (78 kg)-173 lb 9.6 oz (78.744 kg)] 173 lb 9.6 oz (78.744 kg) (07/15 0703)  Last BM Date: 05/04/12   24-hour weight change: Weight change: -2 lb 10.3 oz (-1.2 kg)  Weight trends: Filed Weights   05/04/12 0447 05/04/12 1010 05/05/12 0703  Weight: 174 lb 9.7 oz (79.2 kg) 171 lb 15.3 oz (78 kg) 173 lb 9.6 oz (78.744 kg)    Intake/Output:  07/14 0701 - 07/15 0700 In: 843 [P.O.:840; I.V.:3] Out: 675 [Urine:675] Total I/O In: -  Out: 475 [Urine:475]   Physical Exam: BP 122/65  Pulse 67  Temp 97.8 F (36.6 C) (Oral)  Resp 18  Ht 5\' 1"  (1.549 m)  Wt 173 lb 9.6 oz (78.744 kg)  BMI 32.80 kg/m2  SpO2 90%    Currently 05/05/2012 7:49 AM  HR =78, BP = 135/78 General: Vital signs reviewed and noted. Elderly, no acute distress; alert, appropriate and cooperative .   Head: Normocephalic, atraumatic.  Eyes: conjunctivae/corneas clear.  EOM's intact.   Throat: normal  Neck: Supple. Normal carotids. No JVD  Lungs:   Decreased BS at the bases, no wheezes  Heart: Regular rate and rhythm  Abdomen:  Soft, non-tender, non-distended with normoactive bowel sounds. No hepatomegaly. No rebound/guarding. No abdominal masses.  Extremities: Distal pedal pulses are 2+ .  No edema.    Neurologic: Strength/ sensation are intact  Psych: Responds to questions appropriately with normal affect.    Labs: BMET:  Basename 05/05/12 0555 05/04/12 0500    NA 140 136  K 3.7 3.0*  CL 99 98  CO2 38* 32  GLUCOSE 139* 203*  BUN 21 23  CREATININE 0.70 0.65  CALCIUM 8.0* 7.9*  MG -- --  PHOS -- --     Other:  Tele:  NSR., nocturnal bradycardia noted  Medications:    Infusions:    Scheduled Medications:    . a stronger pump book   Does not apply Once  . albuterol  2.5 mg Nebulization Q6H  . amitriptyline  25 mg Oral QHS  . antiseptic oral rinse  15 mL Mouth Rinse BID  . aspirin EC  81 mg Oral Daily  . atorvastatin  20 mg Oral q1800  . calcium-vitamin D  1 tablet Oral BID  . carvedilol  6.25 mg Oral BID WC  . clopidogrel  75 mg Oral Q breakfast  . docusate sodium  100 mg Oral BID  . feeding supplement  1 Container Oral BID BM  . fentaNYL  75 mcg Transdermal Q72H  . ferrous sulfate  325 mg Oral Q breakfast  . furosemide  40 mg Oral BID  . heparin subcutaneous  5,000 Units Subcutaneous Q8H  . hydrocortisone sodium succinate  25 mg Intravenous Q6H  . ipratropium  0.5 mg Nebulization Q6H  .  losartan  50 mg Oral Daily  . multivitamin with minerals  1 tablet Oral Daily  . nitroGLYCERIN  0.4 mg Transdermal Daily  . pantoprazole  40 mg Oral QHS  . potassium chloride  20 mEq Oral BID  . sodium chloride  3 mL Intravenous Q12H  . spironolactone  12.5 mg Oral Daily  . tiotropium  18 mcg Inhalation Daily  . DISCONTD: albuterol  2.5 mg Nebulization Q4H  . DISCONTD: aspirin  325 mg Oral Daily  . DISCONTD: atorvastatin  20 mg Oral q1800  . DISCONTD: atorvastatin  20 mg Oral Daily  . DISCONTD: calcium carbonate  600 mg Oral BID WC  . DISCONTD: chlordiazePOXIDE-amitriptyline  1 tablet Oral QHS  . DISCONTD: clopidogrel  75 mg Oral Daily  . DISCONTD: ferrous gluconate  325 mg Oral Q breakfast  . DISCONTD: furosemide  40 mg Oral BID  . DISCONTD: ipratropium  0.5 mg Nebulization Q4H  . DISCONTD: losartan  25 mg Oral Daily  . DISCONTD: potassium chloride SA  20 mEq Oral BID  . DISCONTD: spironolactone  12.5 mg Oral UD    Assessment/  Plan:   1.  Congestive heart failure with left ventricular systolic dysfunction (08/07/2011) Continue Losartan and Coreg.   Keep Is and Os about even  Ischemic heart disease (03/20/2011) No chest pain. On ASA and Plavix  COPD (chronic obstructive pulmonary disease) (10/18/2011) Stable.  Plans per PCCM.  Atrial fibrillation (04/24/2012) Maintaining sinus rhythm Will defer decisions about anticoagulation to Dr Patty Sermons. As he is on both ASA and Plavix, I will not add coumadin at this time.  Respiratory failure with hypercapnia (04/24/2012) Plans per PCCM   Plans for SNF noted.  I anticipate that he may be able to go as early as tomorrow. Continue PT  Length of Stay: 12  Cailin Gebel,MD

## 2012-05-05 NOTE — Progress Notes (Signed)
Pt. Has had several missed beats with a pause of 2.5 seconds.  Pt. Asymptomatic and asleep.  VSS.  [name], PA made aware.  Stated to hold coreg this morning until rounding team has seen patient.  Day shift nurse made aware to hold coreg for now.  Will continue to monitor patient.

## 2012-05-06 ENCOUNTER — Inpatient Hospital Stay (HOSPITAL_COMMUNITY): Payer: Medicare Other

## 2012-05-06 LAB — CARDIAC PANEL(CRET KIN+CKTOT+MB+TROPI)
Relative Index: INVALID (ref 0.0–2.5)
Troponin I: 0.34 ng/mL (ref <0.30)

## 2012-05-06 LAB — COMPREHENSIVE METABOLIC PANEL
ALT: 30 U/L (ref 0–53)
Albumin: 2.3 g/dL — ABNORMAL LOW (ref 3.5–5.2)
Alkaline Phosphatase: 90 U/L (ref 39–117)
Chloride: 99 mEq/L (ref 96–112)
GFR calc Af Amer: 87 mL/min — ABNORMAL LOW (ref >90)
Glucose, Bld: 99 mg/dL (ref 70–99)
Potassium: 4.4 mEq/L (ref 3.5–5.1)
Sodium: 139 mEq/L (ref 135–145)
Total Protein: 6.6 g/dL (ref 6.0–8.3)

## 2012-05-06 LAB — BASIC METABOLIC PANEL
CO2: 36 mEq/L — ABNORMAL HIGH (ref 19–32)
Calcium: 8.4 mg/dL (ref 8.4–10.5)
GFR calc Af Amer: 70 mL/min — ABNORMAL LOW (ref >90)
GFR calc non Af Amer: 60 mL/min — ABNORMAL LOW (ref >90)
Sodium: 139 mEq/L (ref 135–145)

## 2012-05-06 LAB — BLOOD GAS, ARTERIAL
Acid-Base Excess: 9.3 mmol/L — ABNORMAL HIGH (ref 0.0–2.0)
Drawn by: 222511
FIO2: 0.5 %
O2 Content: 15 L/min
O2 Saturation: 89.4 %
Patient temperature: 98.6
TCO2: 39.2 mmol/L (ref 0–100)

## 2012-05-06 LAB — POCT I-STAT 3, ART BLOOD GAS (G3+)
Acid-Base Excess: 10 mmol/L — ABNORMAL HIGH (ref 0.0–2.0)
Bicarbonate: 36.3 mEq/L — ABNORMAL HIGH (ref 20.0–24.0)
O2 Saturation: 99 %
Patient temperature: 98.6
pO2, Arterial: 159 mmHg — ABNORMAL HIGH (ref 80.0–100.0)

## 2012-05-06 LAB — CBC
Hemoglobin: 11.4 g/dL — ABNORMAL LOW (ref 13.0–17.0)
MCHC: 33 g/dL (ref 30.0–36.0)
WBC: 9.6 10*3/uL (ref 4.0–10.5)

## 2012-05-06 LAB — CK TOTAL AND CKMB (NOT AT ARMC)
CK, MB: 11.9 ng/mL (ref 0.3–4.0)
Total CK: 83 U/L (ref 7–232)

## 2012-05-06 MED ORDER — FENTANYL CITRATE 0.05 MG/ML IJ SOLN
INTRAMUSCULAR | Status: AC
  Filled 2012-05-06: qty 4

## 2012-05-06 MED ORDER — POTASSIUM CHLORIDE 20 MEQ/15ML (10%) PO LIQD
20.0000 meq | Freq: Two times a day (BID) | ORAL | Status: DC
  Administered 2012-05-06: 20 meq
  Filled 2012-05-06 (×3): qty 15

## 2012-05-06 MED ORDER — NALOXONE HCL 0.4 MG/ML IJ SOLN
INTRAMUSCULAR | Status: AC
  Administered 2012-05-06: 0.4 mg
  Filled 2012-05-06: qty 1

## 2012-05-06 MED ORDER — ADULT MULTIVITAMIN LIQUID CH
5.0000 mL | Freq: Every day | ORAL | Status: DC
  Administered 2012-05-07: 5 mL
  Filled 2012-05-06 (×2): qty 5

## 2012-05-06 MED ORDER — FENTANYL CITRATE 0.05 MG/ML IJ SOLN
25.0000 ug | INTRAMUSCULAR | Status: DC | PRN
  Administered 2012-05-07 (×2): 25 ug via INTRAVENOUS
  Filled 2012-05-06: qty 2

## 2012-05-06 MED ORDER — ETOMIDATE 2 MG/ML IV SOLN
20.0000 mg | Freq: Once | INTRAVENOUS | Status: AC
  Administered 2012-05-06: 20 mg via INTRAVENOUS

## 2012-05-06 MED ORDER — MIDAZOLAM HCL 2 MG/2ML IJ SOLN
1.0000 mg | Freq: Once | INTRAMUSCULAR | Status: AC
  Administered 2012-05-06: 2 mg via INTRAVENOUS

## 2012-05-06 MED ORDER — FERROUS SULFATE 300 (60 FE) MG/5ML PO SYRP
300.0000 mg | ORAL_SOLUTION | Freq: Every day | ORAL | Status: DC
  Administered 2012-05-07: 300 mg
  Filled 2012-05-06 (×3): qty 5

## 2012-05-06 MED ORDER — MIDAZOLAM HCL 2 MG/2ML IJ SOLN
INTRAMUSCULAR | Status: AC
  Filled 2012-05-06: qty 2

## 2012-05-06 MED ORDER — SODIUM CHLORIDE 0.9 % IV BOLUS (SEPSIS)
250.0000 mL | Freq: Once | INTRAVENOUS | Status: AC
  Administered 2012-05-06: 250 mL via INTRAVENOUS

## 2012-05-06 MED ORDER — MIDAZOLAM HCL 2 MG/2ML IJ SOLN
INTRAMUSCULAR | Status: AC
  Administered 2012-05-06: 2 mg via INTRAVENOUS
  Filled 2012-05-06: qty 2

## 2012-05-06 MED ORDER — ACETAMINOPHEN 160 MG/5ML PO SOLN
650.0000 mg | ORAL | Status: DC | PRN
  Filled 2012-05-06: qty 20.3

## 2012-05-06 MED ORDER — ASPIRIN 81 MG PO CHEW
81.0000 mg | CHEWABLE_TABLET | Freq: Every day | ORAL | Status: DC
  Administered 2012-05-07: 81 mg
  Filled 2012-05-06: qty 1

## 2012-05-06 MED ORDER — PANTOPRAZOLE SODIUM 40 MG PO PACK
40.0000 mg | PACK | Freq: Every day | ORAL | Status: DC
  Administered 2012-05-06 – 2012-05-07 (×2): 40 mg
  Filled 2012-05-06 (×3): qty 20

## 2012-05-06 MED ORDER — LIDOCAINE HCL (CARDIAC) 20 MG/ML IV SOLN
INTRAVENOUS | Status: AC
  Filled 2012-05-06: qty 5

## 2012-05-06 MED ORDER — DEXMEDETOMIDINE HCL IN NACL 200 MCG/50ML IV SOLN
0.4000 ug/kg/h | INTRAVENOUS | Status: DC
  Administered 2012-05-06: 0.5 ug/kg/h via INTRAVENOUS
  Filled 2012-05-06 (×2): qty 50

## 2012-05-06 MED ORDER — NALOXONE HCL 0.4 MG/ML IJ SOLN
0.2500 mg/h | INTRAVENOUS | Status: DC
  Administered 2012-05-06: 0.25 mg/h via INTRAVENOUS
  Filled 2012-05-06: qty 2.5

## 2012-05-06 MED ORDER — ETOMIDATE 2 MG/ML IV SOLN
INTRAVENOUS | Status: AC
  Administered 2012-05-06: 20 mg via INTRAVENOUS
  Filled 2012-05-06: qty 20

## 2012-05-06 MED ORDER — ROCURONIUM BROMIDE 50 MG/5ML IV SOLN
INTRAVENOUS | Status: AC
  Administered 2012-05-06: 30 mg via INTRAVENOUS
  Filled 2012-05-06: qty 2

## 2012-05-06 MED ORDER — ROCURONIUM BROMIDE 50 MG/5ML IV SOLN
30.0000 mg | Freq: Once | INTRAVENOUS | Status: AC
  Administered 2012-05-06: 30 mg via INTRAVENOUS
  Filled 2012-05-06: qty 3

## 2012-05-06 MED ORDER — MIDAZOLAM HCL 2 MG/2ML IJ SOLN: 1.0000 mg | INTRAMUSCULAR | Status: DC | PRN

## 2012-05-06 MED ORDER — PHENYLEPHRINE HCL 10 MG/ML IJ SOLN
30.0000 ug/min | INTRAVENOUS | Status: DC
  Filled 2012-05-06: qty 1

## 2012-05-06 MED ORDER — DOCUSATE SODIUM 50 MG/5ML PO LIQD
100.0000 mg | Freq: Two times a day (BID) | ORAL | Status: DC
  Administered 2012-05-06 – 2012-05-07 (×3): 100 mg
  Filled 2012-05-06 (×6): qty 10

## 2012-05-06 MED ORDER — CALCIUM CARBONATE 1250 MG/5ML PO SUSP
500.0000 mg | Freq: Two times a day (BID) | ORAL | Status: DC
  Administered 2012-05-06 – 2012-05-07 (×3): 500 mg
  Filled 2012-05-06 (×5): qty 5

## 2012-05-06 MED ORDER — NALOXONE HCL 0.4 MG/ML IJ SOLN
0.4000 mg | INTRAMUSCULAR | Status: DC | PRN
  Administered 2012-05-06: 0.2 mg via INTRAVENOUS
  Filled 2012-05-06: qty 1

## 2012-05-06 MED ORDER — SUCCINYLCHOLINE CHLORIDE 20 MG/ML IJ SOLN
INTRAMUSCULAR | Status: AC
  Filled 2012-05-06: qty 10

## 2012-05-06 MED ORDER — IPRATROPIUM-ALBUTEROL 18-103 MCG/ACT IN AERO
6.0000 | INHALATION_SPRAY | Freq: Four times a day (QID) | RESPIRATORY_TRACT | Status: DC
  Administered 2012-05-07 (×3): 6 via RESPIRATORY_TRACT
  Filled 2012-05-06: qty 14.7

## 2012-05-06 MED ORDER — DOPAMINE-DEXTROSE 3.2-5 MG/ML-% IV SOLN
5.0000 ug/kg/min | INTRAVENOUS | Status: DC
  Administered 2012-05-06: 5 ug/kg/min via INTRAVENOUS
  Administered 2012-05-08: 3 ug/kg/min via INTRAVENOUS
  Filled 2012-05-06 (×2): qty 250

## 2012-05-06 NOTE — Progress Notes (Signed)
Around 5am called to evaluate patient who became unresponsive and developed increased O2 needs. Upon arrival, patient back in bed, only responsive to deep oral suctioning, HR 60-70s SBP 80-90s, RR 10 97% on 100% NRB. Patient DNR, MD and family notified. Advised bedside RN to call with further issues, will continue to monitor

## 2012-05-06 NOTE — Progress Notes (Signed)
   SUBJECTIVE:  Called to see the patient with decreased LOC and falling respirations. I reviewed the entire chart.  He had been making slow progress from shock and hypercarbic/hypoxic respiratory arrest.  He had a Fentanyl patch applied 7/14.  This was discontinued at 0500 this am.  He has received no further sedating medications.   PHYSICAL EXAM Filed Vitals:   05/05/12 2116 05/06/12 0338 05/06/12 0447 05/06/12 0449  BP:   89/39 89/45  Pulse:   70   Temp:      TempSrc:      Resp:   18   Height:      Weight:      SpO2: 96% 95% 93%    General:  Unarousable Lungs:  Decreased breath sounds Heart:  RRR Abdomen:  Decreased breath sounds Extremities:  Diffuse mild edema  LABS:  Results for orders placed during the hospital encounter of 04/23/12 (from the past 24 hour(s))  BASIC METABOLIC PANEL     Status: Abnormal   Collection Time   05/05/12  5:55 AM      Component Value Range   Sodium 140  135 - 145 mEq/L   Potassium 3.7  3.5 - 5.1 mEq/L   Chloride 99  96 - 112 mEq/L   CO2 38 (*) 19 - 32 mEq/L   Glucose, Bld 139 (*) 70 - 99 mg/dL   BUN 21  6 - 23 mg/dL   Creatinine, Ser 1.61  0.50 - 1.35 mg/dL   Calcium 8.0 (*) 8.4 - 10.5 mg/dL   GFR calc non Af Amer 84 (*) >90 mL/min   GFR calc Af Amer >90  >90 mL/min    Intake/Output Summary (Last 24 hours) at 05/06/12 0531 Last data filed at 05/05/12 2140  Gross per 24 hour  Intake    966 ml  Output    675 ml  Net    291 ml    ASSESSMENT AND PLAN:   Lethargy:  Abrupt change in status.  Question secondary to oversedation.  I will give Narcan.  Avoid all narcotics  Atrial fibrillation:  Maintaining NSR  Acute respiratory failure with hypoxia:  I have reviewed the chart.  He is a DNR/DNI     Rollene Rotunda 05/06/2012 5:31 AM

## 2012-05-06 NOTE — Progress Notes (Signed)
Name: Trevor [name] MRN: [id number] DOB: October 25, 1924    LOS: 13  PULMONARY / CRITICAL CARE MEDICINE   HPI:   78 year old male with PMH relevant for CAD, systolic CHF, Vfib, HTN, COPD Admitted initially to the Cardiology service for management of acute decompensated CHF. He was also started on heparin drip for new A. Fib.  code blue was called. He developed Vtach requiring CPR for a brief period of time with return to spontaneous circulation. He was intubated. 7/16 PCCM called bact to evaluate or lethargy post Tx with amitriptyline and Iv fentanyl.   Vital Signs: Temp:  [97 F (36.1 C)] 97 F (36.1 C) (07/15 2111) Pulse Rate:  [59-72] 68  (07/16 1001) Resp:  [10-18] 14  (07/16 1001) BP: (84-123)/(34-58) 94/39 mmHg (07/16 0835) SpO2:  [86 %-100 %] 98 % (07/16 1001)  Physical Examination: Gen: Lethargic but rouses to voice. HEENT: NCAT, old lt ij site noted PULM: CTA anterior, bs thru out CV: RRR, no mgr, no JVD AB: BS+, soft, nontender, no hsm Ext: warm, scd's Neuro: Lethargic, moves all four ext, follows commands after given narcan   Principal Problem:  *Lethargy Active Problems:  Ischemic heart disease  Congestive heart failure with left ventricular systolic dysfunction  COPD (chronic obstructive pulmonary disease)  Atrial fibrillation  Respiratory failure with hypercapnia  Acute respiratory failure with hypoxia  Shock   ASSESSMENT AND PLAN 78 y/o male with CHF, CAD and likely COPD admitted 7/3 with lethargy but developed respiratory failure on 7/5 leading to PEA arrest and VTach. He was intubated and found to be markedly hypotensive afterwards requiring vasopressors.  DDx includes septic shock from pneumonia vs. Cardiogenic shock. 7/16 increased lethargy from presumed overdose PULMONARY No results found for this basename: PHART:5,PCO2:5,PCO2ART:5,PO2ART:5,HCO3:5,O2SAT:5 in the last 168 hours Ventilator Settings:      A:   1) Acute hypercarbic and hypoxemic  respiratory failure secondary to fentanyl and amytrip 2) COPD with evidence of chronic hypercarbia. 3)Hx of  Pneumonia: aspiration vs. HCAP  P:   -Tx back to ICU -Narcan drip -Intubate if needed. Family are ok WITH SHORT TERM INTUBATION only so will change code status to LCB with intubation ok. -D/C all sedatives and narcotics  CARDIOVASCULAR No results found for this basename: TROPONINI:5,LATICACIDVEN:5, O2SATVEN:5,PROBNP:5 in the last 168 hours ECG:  Sinus rhythm Lines: Left IJ 04/25/12 >>out  A:  Shock, likely multifactorial but narcs are playing a factor here  Bedside echocardiogram with 25% LVEF. Chest X ray with bilateral diffuse infiltrates P:  Gentle hydration as the patient is already fluid overloaded.  RENAL  Lab 05/06/12 0605 05/05/12 0555 05/04/12 0500 05/03/12 0512 05/02/12 0500  NA 139 140 136 139 136  K 4.9 3.7 -- -- --  CL 100 99 98 102 98  CO2 36* 38* 32 34* 35*  BUN 25* 21 23 23 23   CREATININE 1.09 0.70 0.65 0.65 0.72  CALCIUM 8.4 8.0* 7.9* 8.4 8.3*  MG -- -- -- -- --  PHOS -- -- -- -- --   Intake/Output      07/15 0701 - 07/16 0700 07/16 0701 - 07/17 0700   P.O. 960    I.V. (mL/kg) 6 (0.1)    Total Intake(mL/kg) 966 (12.3)    Urine (mL/kg/hr) 675 (0.4)    Total Output 675    Net +291          Foley:  Placed 04/25/12>>out  A:   Creatinine improved, would need neg balance to continue P:  Lab Results  Component Value Date   CREATININE 1.09 05/06/2012   CREATININE 0.70 05/05/2012   CREATININE 0.65 05/04/2012   GASTROINTESTINAL No results found for this basename: AST:5,ALT:5,ALKPHOS:5,BILITOT:5,PROT:5,ALBUMIN:5 in the last 168 hours  A:  NPO 7/16 P:   - GI prophylaxis - NPO while mental status is so altered.  HEMATOLOGIC  Lab 05/02/12 0500  HGB 9.8*  HCT 30.7*  PLT 105*  INR --  APTT --   A:  1) Anemia dilutional P Lasix, consider cbc follow up   INFECTIOUS  Lab 05/02/12 0500  WBC 10.8*  PROCALCITON --   Cultures: Blood,  tracheal and urine cultures sent.  Antibiotics: - Zosyn (04/25/12)>>>7/9 Ceftriaxone 7/9>>>plan stop 7/11 - Vancomycin (04/25/12)  >> 7/7  A:  1) Shock, cardiogenic vs septic (doubt) 2) Pneumonia: HCAP vs. aspiration P:   Dc ceftriaxone after July 11th dose Allow to dc No fevers  ENDOCRINE No results found for this basename: GLUCAP:5 in the last 168 hours A:   No history of diabetes Controlled on own  NEUROLOGIC  A:   Drug induced lethargy P:   -see pulmonary  BEST PRACTICE / DISPOSITION - Level of Care:  Floor to ICU 7/16 - Primary Service:  Cardiology - Consultants:  CCM - Code Status:  DNR/short term intubation ok  - Diet: npo - DVT Px:  hep - GI Px:  Protonix - Skin Integrity:  Intact. - Social / Family:  Dnr/ dni  Will transfer back to ICU, if narcan drip is not effective will intubate only for a short period.  The physical exam is completely non-focal and patient aroused some with narcan so no need for head CT for now.  Spoke with wife and daughter extensively, short term intubation ok but would not want prolonged support.  CC time 40 min.  Patient seen and examined, agree with above note.  I dictated the care and orders written for this patient under my direction.  Koren Bound, M.D. [phone]

## 2012-05-06 NOTE — Progress Notes (Signed)
PT Cancellation Note  Treatment cancelled today due to medical issues with patient which prohibited therapy.      Verdell Face, [place] [phone] 05/06/2012

## 2012-05-06 NOTE — Progress Notes (Signed)
I received report this morning that the patient had an episode of decreased respirations and low 02 sats in the early AM. When I went in the room the patient was on a nonrebreather and not responding much to stimulation. I called the PA to let her know that the family wanted to see a physician and to find out if any meds needed to be changed since he was not able to take anything orally. She instructed me not to give any meds at that time. Throughout the morning the PA and the critical care physician came to see him. We gave one dose of 0.2 of narcan, and I was asked to get a bed on 2900. We moved the patient to 2903. Fantashia Shupert, Melida Quitter

## 2012-05-06 NOTE — Progress Notes (Signed)
Per chart review, and discussion with pt rn, pt was transferred to ICU. CSW informed Unit CSW of pt transfer and status of pt dc plans.   Please contact [phone] unit csw with further csw needs.   Catha Gosselin, Theresia Majors  [phone] .05/06/2012 11:50am

## 2012-05-06 NOTE — Procedures (Signed)
Intubation Procedure Note Trevor [name] [id number] 1926-01-113 Procedure: Intubation Indications: Airway protection and maintenance  Procedure Details Consent: Risks of procedure as well as the alternatives and risks of each were explained to the (patient/caregiver).  Consent for procedure obtained. and Unable to obtain consent because of emergent medical necessity. Time Out: Verified patient identification, verified procedure, site/side was marked, verified correct patient position, special equipment/implants available, medications/allergies/relevent history reviewed, required imaging and test results available.  Performed  Maximum sterile technique was used including antiseptics, cap, gloves, hand hygiene and mask.  MAC and 4    Evaluation Hemodynamic Status: BP stable throughout; O2 sats: stable throughout Patient's Current Condition: stable Complications: No apparent complications Patient did tolerate procedure well. Chest X-ray ordered to verify placement.  CXR: pending.   BABCOCK,PETE 05/06/2012  Patient seen and examined, agree with above note.  I dictated the care and orders written for this patient under my direction.  Koren Bound, M.D. [phone]

## 2012-05-06 NOTE — Progress Notes (Signed)
Patient hypotensive after intubation possibly due to precedex; precedex, coreg and spironolactone discontinued; fluid bolus 250 cc given; we will place a line for BP assessment and we will start Neo synephrine to maintain MAP>65.

## 2012-05-06 NOTE — Progress Notes (Signed)
Patient: Undrea Shipes Blau Date of Encounter: 05/06/2012, 9:46 AM Admit date: 04/23/2012     Subjective  Events overnight noted. Mr. Bocock with decreased LOC since 5 AM today. His wife is at bedside. All medications currently being held. Fentanyl and amitriptyline discontinued. Narcan was given.    Objective  Physical Exam: Vitals: BP 94/39  Pulse 69  Temp 97 F (36.1 C) (Oral)  Resp 12  Ht 5\' 1"  (1.549 m)  Wt 173 lb 9.6 oz (78.744 kg)  BMI 32.80 kg/m2  SpO2 100% General: Well developed 78 year old male who is lethargic, slow to respond to verbal stimuli. He is on nonrebreather. Neck: Supple. JVD not elevated. Lungs: Few rales at bases bilaterally with auscultation anteriorly. No wheezes or rhonchi. Heart: RRR S1 S2 without murmurs, rubs, or gallops.  Abdomen: Soft, non-distended. Extremities: No clubbing or cyanosis. No edema.  Distal pedal pulses are 2+ and equal bilaterally. Neuro: Lethargic. Opens eyes to verbal stimuli but is not communicative.  Intake/Output: Intake/Output Summary (Last 24 hours) at 05/06/12 0946 Last data filed at 05/06/12 0700  Gross per 24 hour  Intake    486 ml  Output    200 ml  Net    286 ml   Inpatient Medications:  . albuterol  2.5 mg Nebulization Q6H  . amitriptyline  25 mg Oral QHS  . antiseptic oral rinse  15 mL Mouth Rinse BID  . aspirin EC  81 mg Oral Daily  . atorvastatin  20 mg Oral q1800  . calcium-vitamin D  1 tablet Oral BID  . carvedilol  6.25 mg Oral BID WC  . clopidogrel  75 mg Oral Q breakfast  . docusate sodium  100 mg Oral BID  . feeding supplement  1 Container Oral BID BM  . fentaNYL  75 mcg Transdermal Q72H  . ferrous sulfate  325 mg Oral Q breakfast  . furosemide  40 mg Oral BID  . heparin subcutaneous  5,000 Units Subcutaneous Q8H  . hydrocortisone sodium succinate  25 mg Intravenous Q6H  . ipratropium  0.5 mg Nebulization Q6H  . losartan  50 mg Oral Daily  . multivitamin with minerals  1 tablet Oral Daily    . naloxone      . nitroGLYCERIN  0.4 mg Transdermal Daily  . pantoprazole  40 mg Oral QHS  . potassium chloride  20 mEq Oral BID  . sodium chloride  3 mL Intravenous Q12H  . spironolactone  12.5 mg Oral Daily  . tiotropium  18 mcg Inhalation Daily   Labs:  [facility] 05/06/12 0605 05/05/12 0555  NA 139 140  K 4.9 3.7  CL 100 99  CO2 36* 38*  GLUCOSE 162* 139*  BUN 25* 21  CREATININE 1.09 0.70  CALCIUM 8.4 8.0*  MG -- --  PHOS -- --    Radiology/Studies: 05/01/2012  *RADIOLOGY REPORT*  Clinical Data: Congestive heart failure.  PORTABLE CHEST - 1 VIEW  Comparison: Chest x-rays dated 07/10, 07/09, 07/08, and 04/27/2012  Findings: Chronic cardiomegaly.  Increased elevation of the left hemidiaphragm.  The pulmonary vascularity is now normal.  There is a moderate right pleural effusion with atelectasis at the right lung base, unchanged.  The patient does have a severe  compression fracture in the mid thoracic spine, new since 07/20/2011.  IMPRESSION: Resolving interstitial edema.  Pulmonary vascularity is now normal.  Persistent right effusion and atelectasis.  Compression fracture of the mid thoracic spine is new since 07/20/2011.  Original Report Authenticated By: Gwynn Burly, M.D.   Telemetry: no arrhythmias   Assessment and Plan  1. Decreased LOC - will order stat ABG and pCXR; spoke with Dr. Molli Knock via phone who will see Mr. Mcdowell and make further PCCM recommendations; continue holding all medications; updated Mr. Mcinturff's wife  Signed, EDMISTEN, BROOKE PA-C   I have seen, examined the patient, and reviewed the above assessment and plan.  Pt with progressive lethargy/ unresponsiveness overnight.  He has been evaluated by PCCM and is now in the CCU.  ABG revealed significant elevation of CO2.  At this time, he is more alert and answers my questions.  He is without focal complaints or focal findings on exam. We will follow mental status closely in the CCU today.   Hopefully, as he is waking we will be able to avoid intubation.  Will need to avoid pain medicines/ sedatives going forward which could be difficult with his chronic pain issues.  1. Congestive heart failure with left ventricular systolic dysfunction (08/07/2011)  Resume Losartan and Coreg once BP will allow.  Keep Is and Os about even   Ischemic heart disease (03/20/2011) Contiue ASA and Plavix  COPD (chronic obstructive pulmonary disease) (10/18/2011) Stable. Plans per PCCM.   Atrial fibrillation (04/24/2012) Maintaining sinus rhythm  Will defer decisions about anticoagulation to Dr Patty Sermons.  As he is on both ASA and Plavix, I will not add coumadin at this time.   Respiratory failure with hypercapnia (04/24/2012) Plans per PCCM       Co Sign: [name], MD 05/06/2012 11:31 AM

## 2012-05-06 NOTE — Progress Notes (Signed)
Pt that is a DNR was found unresponsive, and with laboring breathing, pt was picked up put in bed and put on a non-rebreather, called rapid-response sats were in the low 80's before non-rebreather; after pt put on non-rebreather at 15 L O2 with sats in 100's, pt did better with sats but respiratory rate went down to 10/min and very labored, called MD as well, gave order for Narcan, after adminstering Narcan pt started to breath better and more steady at 16-18 resp/min, kept pt at [facility] due to hx of CHF which helped with BP: 89-92systolic/38-42diastolic, after all these interventions, pt was breathing better and more stable, just still lethargic but responding with movement of limbs and mouth, but still not waking up; family discussed with RN that pt does do well with sedatives and narcotics, MD d/c his Fentanyl, will continue to monitor, Thanks, Lavonda Jumbo RN.........[name]

## 2012-05-06 NOTE — Progress Notes (Signed)
CRITICAL VALUE ALERT  Critical value received:  [facility] 11.9. Troponin .49   Date of notification:  05/06/2012   Time of notification:  1811  Critical value read back:yes  Nurse who received alert:  Eliane Decree, RN   Pola Corn MD notified at 1820 of lab values, not new orders.  Plan to cycle enzymes and labs and monitor results.    Eliane Decree, RN

## 2012-05-07 ENCOUNTER — Inpatient Hospital Stay (HOSPITAL_COMMUNITY): Payer: Medicare Other

## 2012-05-07 DIAGNOSIS — R5381 Other malaise: Secondary | ICD-10-CM

## 2012-05-07 DIAGNOSIS — I959 Hypotension, unspecified: Secondary | ICD-10-CM

## 2012-05-07 DIAGNOSIS — I214 Non-ST elevation (NSTEMI) myocardial infarction: Secondary | ICD-10-CM

## 2012-05-07 HISTORY — DX: Non-ST elevation (NSTEMI) myocardial infarction: I21.4

## 2012-05-07 LAB — BASIC METABOLIC PANEL
Chloride: 102 mEq/L (ref 96–112)
GFR calc Af Amer: >90 mL/min (ref >90)
GFR calc non Af Amer: 81 mL/min — ABNORMAL LOW (ref >90)
Potassium: 5.1 mEq/L (ref 3.5–5.1)
Sodium: 139 mEq/L (ref 135–145)

## 2012-05-07 LAB — BLOOD GAS, ARTERIAL
Acid-Base Excess: 11.3 mmol/L — ABNORMAL HIGH (ref 0.0–2.0)
Bicarbonate: 35.9 mEq/L — ABNORMAL HIGH (ref 20.0–24.0)
Bicarbonate: 36 mEq/L — ABNORMAL HIGH (ref 20.0–24.0)
Drawn by: 324701
FIO2: 0.5 %
O2 Saturation: 97.6 %
Patient temperature: 98.6
Patient temperature: 98.6
Pressure support: 5 cmH2O
TCO2: 37.5 mmol/L (ref 0–100)
pH, Arterial: 7.419 (ref 7.350–7.450)
pO2, Arterial: 178 mmHg — ABNORMAL HIGH (ref 80.0–100.0)

## 2012-05-07 LAB — CARDIAC PANEL(CRET KIN+CKTOT+MB+TROPI)
Relative Index: INVALID (ref 0.0–2.5)
Troponin I: 0.31 ng/mL (ref <0.30)

## 2012-05-07 MED ORDER — BIOTENE DRY MOUTH MT LIQD
15.0000 mL | Freq: Four times a day (QID) | OROMUCOSAL | Status: DC
  Administered 2012-05-07 – 2012-05-08 (×5): 15 mL via OROMUCOSAL

## 2012-05-07 MED ORDER — CHLORHEXIDINE GLUCONATE 0.12 % MT SOLN
15.0000 mL | Freq: Two times a day (BID) | OROMUCOSAL | Status: DC
  Administered 2012-05-07: 15 mL via OROMUCOSAL
  Filled 2012-05-07: qty 15

## 2012-05-07 MED ORDER — CLOPIDOGREL BISULFATE 75 MG PO TABS
75.0000 mg | ORAL_TABLET | Freq: Every day | ORAL | Status: DC
  Administered 2012-05-09: 75 mg via ORAL
  Filled 2012-05-07 (×3): qty 1

## 2012-05-07 MED ORDER — IPRATROPIUM-ALBUTEROL 18-103 MCG/ACT IN AERO
2.0000 | INHALATION_SPRAY | Freq: Three times a day (TID) | RESPIRATORY_TRACT | Status: DC
  Administered 2012-05-07 – 2012-05-09 (×7): 2 via RESPIRATORY_TRACT
  Filled 2012-05-07: qty 14.7

## 2012-05-07 MED ORDER — IPRATROPIUM-ALBUTEROL 18-103 MCG/ACT IN AERO
6.0000 | INHALATION_SPRAY | Freq: Three times a day (TID) | RESPIRATORY_TRACT | Status: DC
  Filled 2012-05-07: qty 14.7

## 2012-05-07 NOTE — Progress Notes (Signed)
05/07/12 extubated to 2L [place]  Pt is stable RT will continue to monitor

## 2012-05-07 NOTE — Progress Notes (Signed)
Name: Trevor [name] MRN: [id number] DOB: 1925/02/03    LOS: 14  PULMONARY / CRITICAL CARE MEDICINE   HPI:   78 year old male with PMH relevant for CAD, systolic CHF, Vfib, HTN, COPD Admitted initially to the Cardiology service for management of acute decompensated CHF. He was also started on heparin drip for new A. Fib.  code blue was called. He developed Vtach requiring CPR for a brief period of time with return to spontaneous circulation. He was intubated. 7/16 PCCM called bact to evaluate or lethargy post Tx with amitriptyline and Iv fentanyl.   Vital Signs: Temp:  [98 F (36.7 C)-99.9 F (37.7 C)] 99.2 F (37.3 C) (07/17 0400) Pulse Rate:  [48-105] 77  (07/17 0843) Resp:  [14-24] 20  (07/17 0843) BP: (60-126)/(29-52) 114/38 mmHg (07/17 0700) SpO2:  [82 %-100 %] 100 % (07/17 0843) FiO2 (%):  [40 %-100 %] 40 % (07/17 0843) Weight:  [77.9 kg (171 lb 11.8 oz)] 77.9 kg (171 lb 11.8 oz) (07/17 0500)  Physical Examination: Gen: Lethargic but rouses to voice, intubated. HEENT: NCAT, old lt ij site noted. PULM: Coarse BS diffusely. CV: RRR, no mgr, no JVD AB: BS+, soft, nontender, no hsm Ext: warm, scd's Neuro: Lethargic, moves all four ext, follows commands after given narcan  Intake/Output Summary (Last 24 hours) at 05/07/12 0849 Last data filed at 05/07/12 0700  Gross per 24 hour  Intake 665.22 ml  Output   1350 ml  Net -684.78 ml   Principal Problem:  *Lethargy Active Problems:  Ischemic heart disease  Congestive heart failure with left ventricular systolic dysfunction  COPD (chronic obstructive pulmonary disease)  Atrial fibrillation  Respiratory failure with hypercapnia  Acute respiratory failure with hypoxia  Shock  NSTEMI (non-ST elevated myocardial infarction)  Hypotension  ASSESSMENT AND PLAN 78 y/o male with CHF, CAD and likely COPD admitted 7/3 with lethargy but developed respiratory failure on 7/5 leading to PEA arrest and VTach. He was intubated on  7/16 after an overdose of narcotics and amitriptyline. 7/16 increased lethargy from presumed overdose PULMONARY  Lab 05/07/12 0342 05/06/12 1510 05/06/12 0932  PHART 7.454* 7.402 7.247*  PCO2ART 51.9* 58.3* 86.8*  PO2ART 87.0 159.0* 53.9*  HCO3 35.9* 36.3* 36.5*  O2SAT 97.6 99.0 89.4   Ventilator Settings: Vent Mode:  [-] PSV FiO2 (%):  [40 %-100 %] 40 % Set Rate:  [16 bmp] 16 bmp Vt Set:  [430 mL] 430 mL PEEP:  [5 cmH20] 5 cmH20 Pressure Support:  [5 cmH20] 5 cmH20 Plateau Pressure:  [17 cmH20-26 cmH20] 17 cmH20  ETT 7/16>>>  A:   1) Acute hypercarbic and hypoxemic respiratory failure secondary to fentanyl and amitriptyline. 2) COPD with evidence of chronic hypercarbia. 3) Hx of  Pneumonia: aspiration vs. HCAP completed course of abx.  P:   -PS and hopefully wean to extubate today. -No further sedating drugs. -Will hopefully be able to extubate today.  CARDIOVASCULAR  Lab 05/07/12 0515 05/06/12 2027 05/06/12 1719  TROPONINI 0.31* 0.34* 0.49*  LATICACIDVEN -- -- --  PROBNP -- -- --   ECG:  Sinus rhythm Lines: Left IJ 04/25/12 >>out  A:  Shock, likely multifactorial but narcs are playing a factor here and specially more pronounced after intubation. Bedside echocardiogram with 25% LVEF.  Chest X ray with bilateral diffuse infiltrates but unlikely PNA. P:  Dopamine for BP support. KVO IVF. On hydrocortisone at 25 q6 hours.  RENAL  Lab 05/07/12 0515 05/06/12 2027 05/06/12 0605 05/05/12 0555 05/04/12 0500  NA 139 139 139 140 136  K 5.1 4.4 -- -- --  CL 102 99 100 99 98  CO2 29 31 36* 38* 32  BUN 33* 35* 25* 21 23  CREATININE 0.78 0.92 1.09 0.70 0.65  CALCIUM 8.4 8.7 8.4 8.0* 7.9*  MG -- 2.2 -- -- --  PHOS -- -- -- -- --   Intake/Output      07/16 0701 - 07/17 0700 07/17 0701 - 07/18 0700   P.O.     I.V. (mL/kg) 505.2 (6.5)    NG/GT 160    Total Intake(mL/kg) 665.2 (8.5)    Urine (mL/kg/hr) 1350 (0.7)    Total Output 1350    Net -684.8          Foley:   Placed 04/25/12>>out  A:   Creatinine improved, would need neg balance to continue P:   Lab Results  Component Value Date   CREATININE 0.78 05/07/2012   CREATININE 0.92 05/06/2012   CREATININE 1.09 05/06/2012   GASTROINTESTINAL  Lab 05/06/12 2027  AST 47*  ALT 30  ALKPHOS 90  BILITOT 0.7  PROT 6.6  ALBUMIN 2.3*    A:  NPO until extubated. P:   - GI prophylaxis - NPO but once extubated can restart diet.  HEMATOLOGIC  Lab 05/06/12 2027 05/02/12 0500  HGB 11.4* 9.8*  HCT 34.5* 30.7*  PLT 115* 105*  INR -- --  APTT -- --   A:  1) Anemia dilutional P Hold lasix until BP is better.   INFECTIOUS  Lab 05/06/12 2027 05/02/12 0500  WBC 9.6 10.8*  PROCALCITON -- --   Cultures: Blood, tracheal and urine cultures sent.  Antibiotics: - Zosyn (04/25/12)>>>7/9 Ceftriaxone 7/9>>>plan stop 7/11 - Vancomycin (04/25/12)  >> 7/7  A:  1) Shock, cardiogenic vs septic (doubt) 2) Pneumonia: HCAP vs. aspiration P:   Hold off abx for now. No fevers.  ENDOCRINE No results found for this basename: GLUCAP:5 in the last 168 hours A:   No history of diabetes Controlled on own  NEUROLOGIC  A:   Drug induced lethargy P:   -see pulmonary  BEST PRACTICE / DISPOSITION - Level of Care:  Floor to ICU 7/16 - Primary Service:  Cardiology - Consultants:  CCM - Code Status:  DNR/short term intubation ok  - Diet: npo - DVT Px:  hep - GI Px:  Protonix - Skin Integrity:  Intact. - Social / Family:  Dnr/ dni  CC time 40 min.  Koren Bound, M.D. [phone]

## 2012-05-07 NOTE — Progress Notes (Signed)
PT Cancellation Note  Treatment cancelled today due to medical issues with patient which prohibited therapy.  Just extubated per nursing.  Nursing asked PT to check back tomorrow.  MD:  Please address activity level desired.  Thanks.  INGOLD,[name] 05/07/2012, 10:46 AM  [name] [phone] (pager)

## 2012-05-07 NOTE — Progress Notes (Signed)
Patient: Trevor [name] Date of Encounter: 05/07/2012, 8:24 AM Admit date: 04/23/2012     Subjective   Remains quite somnolent, intubated. BP low with intubation (as with prior intubation) and he has required dopamine overnight.     Objective  Physical Exam: Vitals: BP 114/38  Pulse 76  Temp 99.2 F (37.3 C) (Oral)  Resp 18  Ht 5\' 1"  (1.549 m)  Wt 171 lb 11.8 oz (77.9 kg)  BMI 32.45 kg/m2  SpO2 100% General:  Sleeping but rouses, intubated ETT in place Neck: Supple. JVD not elevated. Lungs: Few rales at bases bilaterally with auscultation anteriorly. No wheezes or rhonchi. Heart: RRR S1 S2 without murmurs, rubs, or gallops.  Abdomen: Soft, non-distended. Extremities: No clubbing or cyanosis. No edema.  Distal pedal pulses are 2+ and equal bilaterally. Neuro:  Sleeping but rouses, per nursing staff, he was able to write on a white board for them overnight  Intake/Output: Intake/Output Summary (Last 24 hours) at 05/06/12 0946 Last data filed at 05/06/12 0700  Gross per 24 hour  Intake    486 ml  Output    200 ml  Net    286 ml   Inpatient Medications:  Reviewed  Labs:  [facility] 05/07/12 0515 05/06/12 2027  NA 139 139  K 5.1 4.4  CL 102 99  CO2 29 31  GLUCOSE 96 99  BUN 33* 35*  CREATININE 0.78 0.92  CALCIUM 8.4 8.7  MG -- 2.2  PHOS -- --    Radiology/Studies: 05/01/2012  *RADIOLOGY REPORT*  Clinical Data: Congestive heart failure.  PORTABLE CHEST - 1 VIEW  Comparison: Chest x-rays dated 07/10, 07/09, 07/08, and 04/27/2012  Findings: Chronic cardiomegaly.  Increased elevation of the left hemidiaphragm.  The pulmonary vascularity is now normal.  There is a moderate right pleural effusion with atelectasis at the right lung base, unchanged.  The patient does have a severe  compression fracture in the mid thoracic spine, new since 07/20/2011.  IMPRESSION: Resolving interstitial edema.  Pulmonary vascularity is now normal.  Persistent right effusion and  atelectasis.  Compression fracture of the mid thoracic spine is new since 07/20/2011.  Original Report Authenticated By: Gwynn Burly, M.D.   Telemetry: sinus   Assessment and Plan   1. AMS- remains lethargic but occasionally interactive Per PCCM, head CT was deferred.  I would have a low threshold to obtain a head CT if any focal changes occur of he does not quickly improve.  He had hypercarbia with the event which likely contributed to AMS.  I would anticipate that his mentation should quickly improve now that his CO2 is coming down. Avoid Narcotics/ sedatives/ amitriptyline going forward This could be difficult with his chronic pain issues.  2. Congestive heart failure with left ventricular systolic dysfunction (08/07/2011)  BP meds are on hold due to hypotension Given ongoing hypotension, I think that we should consider adrenal insufficiency. I will order a cortisol level this am.  He may benefit from a cosyntropin stim test.  I will ask PCCM to make this decision once cortisol level is back.  3. Ischemic heart disease (03/20/2011)/ NSTEMI overnight Contiue ASA and Plavix Mildly elevated cardiac markers are likely due to demand ischemia from hypotension with inbutation Will follow  COPD (chronic obstructive pulmonary disease) (10/18/2011) Intubated. Plans per PCCM.   Atrial fibrillation (04/24/2012) Maintaining sinus rhythm  Will defer decisions about anticoagulation to Dr Patty Sermons.  As he is on both ASA and Plavix, I will not  add coumadin at this time.   Respiratory failure with hypercapnia (04/24/2012) Plans per PCCM   [name], MD 05/07/2012 8:24 AM

## 2012-05-08 ENCOUNTER — Inpatient Hospital Stay (HOSPITAL_COMMUNITY): Payer: Medicare Other

## 2012-05-08 DIAGNOSIS — I959 Hypotension, unspecified: Secondary | ICD-10-CM

## 2012-05-08 LAB — BASIC METABOLIC PANEL
Chloride: 104 mEq/L (ref 96–112)
Creatinine, Ser: 0.64 mg/dL (ref 0.50–1.35)
GFR calc Af Amer: >90 mL/min (ref >90)
GFR calc non Af Amer: 87 mL/min — ABNORMAL LOW (ref >90)

## 2012-05-08 LAB — CBC
HCT: 27.6 % — ABNORMAL LOW (ref 39.0–52.0)
Platelets: 132 10*3/uL — ABNORMAL LOW (ref 150–400)
RDW: 18.9 % — ABNORMAL HIGH (ref 11.5–15.5)
WBC: 7.2 10*3/uL (ref 4.0–10.5)

## 2012-05-08 LAB — MAGNESIUM: Magnesium: 2.1 mg/dL (ref 1.5–2.5)

## 2012-05-08 LAB — CARDIAC PANEL(CRET KIN+CKTOT+MB+TROPI)
Relative Index: INVALID (ref 0.0–2.5)
Total CK: 31 U/L (ref 7–232)

## 2012-05-08 LAB — PHOSPHORUS: Phosphorus: 2.4 mg/dL (ref 2.3–4.6)

## 2012-05-08 MED ORDER — ASPIRIN EC 81 MG PO TBEC
81.0000 mg | DELAYED_RELEASE_TABLET | Freq: Every day | ORAL | Status: DC
  Administered 2012-05-08 – 2012-05-19 (×12): 81 mg via ORAL
  Filled 2012-05-08 (×12): qty 1

## 2012-05-08 MED ORDER — ADULT MULTIVITAMIN W/MINERALS CH
1.0000 | ORAL_TABLET | Freq: Every day | ORAL | Status: DC
  Administered 2012-05-08 – 2012-05-19 (×12): 1 via ORAL
  Filled 2012-05-08 (×12): qty 1

## 2012-05-08 MED ORDER — FERROUS SULFATE 325 (65 FE) MG PO TABS
325.0000 mg | ORAL_TABLET | Freq: Every day | ORAL | Status: DC
  Administered 2012-05-08 – 2012-05-19 (×12): 325 mg via ORAL
  Filled 2012-05-08 (×15): qty 1

## 2012-05-08 MED ORDER — PANTOPRAZOLE SODIUM 40 MG PO TBEC
40.0000 mg | DELAYED_RELEASE_TABLET | Freq: Every day | ORAL | Status: DC
  Administered 2012-05-08 – 2012-05-18 (×11): 40 mg via ORAL
  Filled 2012-05-08 (×12): qty 1

## 2012-05-08 MED ORDER — DOCUSATE SODIUM 100 MG PO CAPS
100.0000 mg | ORAL_CAPSULE | Freq: Two times a day (BID) | ORAL | Status: DC
  Administered 2012-05-08 – 2012-05-19 (×22): 100 mg via ORAL
  Filled 2012-05-08 (×24): qty 1

## 2012-05-08 MED ORDER — CALCIUM CARBONATE 1250 (500 CA) MG PO TABS
1.0000 | ORAL_TABLET | Freq: Two times a day (BID) | ORAL | Status: DC
  Administered 2012-05-08 – 2012-05-19 (×22): 500 mg via ORAL
  Filled 2012-05-08 (×24): qty 1

## 2012-05-08 MED ORDER — FUROSEMIDE 10 MG/ML IJ SOLN
20.0000 mg | Freq: Three times a day (TID) | INTRAMUSCULAR | Status: AC
  Administered 2012-05-08 (×2): 20 mg via INTRAVENOUS
  Filled 2012-05-08 (×2): qty 2

## 2012-05-08 NOTE — Progress Notes (Signed)
Thank you to Dr Jacky Kindle for this referral via the long length of stay meeting.  Chart review complete.  Met with patient and wife at beside to overview services.  They are willing accept services based on his level of function when he completes short term rehabilitation.  We will continue to monitor his disposition and follow up during his SNF rehab course. For any additional questions or new referrals please contact Anibal Henderson BSN RN [facility] Liaison at [phone].

## 2012-05-08 NOTE — Progress Notes (Signed)
Name: Trevor [name] MRN: [id number] DOB: [birth year]    LOS: 15  PULMONARY / CRITICAL CARE MEDICINE   HPI:   78 year old male with PMH relevant for CAD, systolic CHF, Vfib, HTN, COPD Admitted initially to the Cardiology service for management of acute decompensated CHF. He was also started on heparin drip for new A. Fib.  code blue was called. He developed Vtach requiring CPR for a brief period of time with return to spontaneous circulation. He was intubated. 7/16 PCCM called bact to evaluate or lethargy post Tx with amitriptyline and Iv fentanyl.   Vital Signs: Temp:  [98.7 F (37.1 C)-99.1 F (37.3 C)] 99 F (37.2 C) (07/18 0400) Pulse Rate:  [65-88] 78  (07/18 0720) Resp:  [16-29] 25  (07/18 0720) BP: (87-128)/(32-63) 109/53 mmHg (07/18 0700) SpO2:  [94 %-100 %] 100 % (07/18 0720) FiO2 (%):  [40 %] 40 % (07/17 0900) Weight:  [78.5 kg (173 lb 1 oz)] 78.5 kg (173 lb 1 oz) (07/18 0436)  Physical Examination: Gen: Lethargic but rouses to voice, intubated. HEENT: NCAT, old lt ij site noted. PULM: Coarse BS diffusely. CV: RRR, no mgr, no JVD AB: BS+, soft, nontender, no hsm Ext: warm, scd's Neuro: Lethargic, moves all four ext, follows commands after given narcan  Intake/Output Summary (Last 24 hours) at 05/08/12 8469 Last data filed at 05/08/12 0600  Gross per 24 hour  Intake  723.3 ml  Output   1050 ml  Net -326.7 ml   Principal Problem:  *Lethargy Active Problems:  Ischemic heart disease  Congestive heart failure with left ventricular systolic dysfunction  COPD (chronic obstructive pulmonary disease)  Atrial fibrillation  Respiratory failure with hypercapnia  Acute respiratory failure with hypoxia  Shock  NSTEMI (non-ST elevated myocardial infarction)  Hypotension  ASSESSMENT AND PLAN 78 y/o male with CHF, CAD and likely COPD admitted 7/3 with lethargy but developed respiratory failure on 7/5 leading to PEA arrest and VTach. He was intubated on 7/16 after an  overdose of narcotics and amitriptyline. 7/16 increased lethargy from presumed overdose PULMONARY  Lab 05/07/12 0922 05/07/12 0342 05/06/12 1510 05/06/12 0932  PHART 7.419 7.454* 7.402 7.247*  PCO2ART 56.7* 51.9* 58.3* 86.8*  PO2ART 178.0* 87.0 159.0* 53.9*  HCO3 36.0* 35.9* 36.3* 36.5*  O2SAT 100.0 97.6 99.0 89.4   Ventilator Settings: Vent Mode:  [-] PSV FiO2 (%):  [40 %] 40 % PEEP:  [5 cmH20] 5 cmH20 Pressure Support:  [5 cmH20] 5 cmH20  ETT 7/16>>>  A:   1) Acute hypercarbic and hypoxemic respiratory failure secondary to fentanyl and amitriptyline. 2) COPD with evidence of chronic hypercarbia. 3) Hx of  Pneumonia: aspiration vs. HCAP completed course of abx.  P:   - Titrate O2 for sat of 88-92%. - No further sedating drugs. - IS and flutter valve.  CARDIOVASCULAR  Lab 05/07/12 0515 05/06/12 2027 05/06/12 1719  TROPONINI 0.31* 0.34* 0.49*  LATICACIDVEN -- -- --  PROBNP -- -- --   ECG:  Sinus rhythm Lines: Left IJ 04/25/12 >>out  A:  Shock, likely multifactorial but narcs are playing a factor here and specially more pronounced after intubation. Bedside echocardiogram with 25% LVEF.  Chest X ray with bilateral diffuse infiltrates but unlikely PNA. P:  Dopamine for BP support but attempt to get off today. KVO IVF. On hydrocortisone at 25 q6 hours and would begin to taper in AM if remains off pressors.  RENAL  Lab 05/08/12 0500 05/07/12 0515 05/06/12 2027 05/06/12 6295 05/05/12 2841  NA 144 139 139 139 140  K 4.2 5.1 -- -- --  CL 104 102 99 100 99  CO2 38* 29 31 36* 38*  BUN 24* 33* 35* 25* 21  CREATININE 0.64 0.78 0.92 1.09 0.70  CALCIUM 8.3* 8.4 8.7 8.4 8.0*  MG 2.1 -- 2.2 -- --  PHOS 2.4 -- -- -- --   Intake/Output      07/17 0701 - 07/18 0700 07/18 0701 - 07/19 0700   P.O. 220    I.V. (mL/kg) 532.2 (6.8)    NG/GT     Total Intake(mL/kg) 752.2 (9.6)    Urine (mL/kg/hr) 1050 (0.6)    Total Output 1050    Net -297.8          Foley:  Placed  04/25/12>>out  A:   Creatinine improved, would need neg balance to continue P:   Lab Results  Component Value Date   CREATININE 0.64 05/08/2012   CREATININE 0.78 05/07/2012   CREATININE 0.92 05/06/2012  - Will give very low dose lasix today given dopamine need.  GASTROINTESTINAL  Lab 05/06/12 2027  AST 47*  ALT 30  ALKPHOS 90  BILITOT 0.7  PROT 6.6  ALBUMIN 2.3*    A:  Protein calorie malnutrition. P:   - GI prophylaxis - Cardiac diet.  HEMATOLOGIC  Lab 05/08/12 0500 05/06/12 2027 05/02/12 0500  HGB 9.1* 11.4* 9.8*  HCT 27.6* 34.5* 30.7*  PLT 132* 115* 105*  INR -- -- --  APTT -- -- --   A:  1) Anemia dilutional P Low dose lasix today given hemodynamics.   INFECTIOUS  Lab 05/08/12 0500 05/06/12 2027 05/02/12 0500  WBC 7.2 9.6 10.8*  PROCALCITON -- -- --   Cultures: Blood, tracheal and urine cultures sent.  Antibiotics: - Zosyn (04/25/12)>>>7/9 - Ceftriaxone 7/9>>>plan stop 7/11 - Vancomycin (04/25/12)  >> 7/7  A:  1) Shock, cardiogenic vs septic (doubt) 2) Pneumonia: HCAP vs. aspiration P:   Low dose lasix today given hemodynamics. No fevers and WBC stable.  ENDOCRINE No results found for this basename: GLUCAP:5 in the last 168 hours A:   No history of diabetes Controlled on own  NEUROLOGIC  A:   Drug induced lethargy that is much improved now. P:   -See pulmonary  BEST PRACTICE / DISPOSITION - Level of Care:  Floor to ICU 7/16. - Primary Service:  Cardiology. - Consultants:  CCM. - Code Status:  DNR/short term intubation ok . - Diet: npo - DVT Px:  hep - GI Px:  Protonix - Skin Integrity:  Intact.  Koren Bound, M.D. [phone]

## 2012-05-08 NOTE — Progress Notes (Signed)
Physical Therapy Treatment Patient Details Name: Trevor [name] MRN: [id number] DOB: [birth year] Today's Date: 05/08/2012 Time: 4098-1191 PT Time Calculation (min): 19 min  PT Assessment / Plan / Recommendation Comments on Treatment Session  Patient s/p afib with RVR, COPD, compression fx., and VDRF.  Will benefit from PT to address balance and endurance.  Needs Short term NHP.      Follow Up Recommendations  Skilled nursing facility;Supervision/Assistance - 24 hour    Barriers to Discharge        Equipment Recommendations  Defer to next venue    Recommendations for Other Services    Frequency Min 3X/week   Plan Discharge plan remains appropriate;Frequency remains appropriate    Precautions / Restrictions Precautions Precautions: Fall Restrictions Weight Bearing Restrictions: No   Pertinent Vitals/Pain VSS, No pain    Mobility  Bed Mobility Bed Mobility: Rolling Right;Right Sidelying to Sit;Sitting - Scoot to [place] of Bed Rolling Right: 4: Min assist Right Sidelying to Sit: 4: Min assist Sitting - Scoot to [place] of Bed: 4: Min assist Sit to Sidelying Right: Not Tested (comment) Details for Bed Mobility Assistance: cues for technique and moving LEs to EOB Transfers Transfers: Sit to Stand;Stand to Sit Sit to Stand: 1: +2 Total assist;From elevated surface;With upper extremity assist;From bed Sit to Stand: Patient Percentage: 70% Stand to Sit: 1: +2 Total assist;With upper extremity assist;With armrests;To chair/3-in-1 Stand to Sit: Patient Percentage: 70% Details for Transfer Assistance: Patient needed cues for hand placement and assist to achieve upright posture.  Patient with slight posterior lean initially needing steadying assist. Ambulation/Gait Ambulation/Gait Assistance: 1: +2 Total assist Ambulation/Gait: Patient Percentage: 70% Ambulation Distance (Feet): 5 Feet Assistive device: Rolling walker Ambulation/Gait Assistance Details: Patient with flexed posture and  needs cues to stand tall and look up for obstacles.  Verbal cues also for safe use of RW.   Gait Pattern: Step-to pattern;Decreased stride length;Shuffle;Trunk flexed;Wide base of support Gait velocity: decreased Stairs: No Wheelchair Mobility Wheelchair Mobility: No    PT Goals Acute Rehab PT Goals PT Goal: Supine/Side to Sit - Progress: Progressing toward goal PT Goal: Sit to Stand - Progress: Progressing toward goal PT Goal: Stand to Sit - Progress: Progressing toward goal PT Goal: Ambulate - Progress: Progressing toward goal  Visit Information  Last PT Received On: 05/08/12 Assistance Needed: +2    Subjective Data  Subjective: "I am happy my breathing is better."   Cognition  Overall Cognitive Status: Appears within functional limits for tasks assessed/performed Arousal/Alertness: Awake/alert Orientation Level: Appears intact for tasks assessed Behavior During Session: [facility] for tasks performed    Balance  Static Standing Balance Static Standing - Balance Support: Bilateral upper extremity supported;During functional activity Static Standing - Level of Assistance: 1: +2 Total assist (pt =80%) Static Standing - Comment/# of Minutes: 2 minutes  End of Session PT - End of Session Equipment Utilized During Treatment: Gait belt Activity Tolerance: Patient tolerated treatment well Patient left: in chair;with call bell/phone within reach;with nursing in room Nurse Communication: Mobility status        INGOLD,Iyania Denne 05/08/2012, 10:19 AM  05/08/2012  [name] [phone] (pager)

## 2012-05-08 NOTE — Progress Notes (Signed)
I agree with the following treatment note after reviewing documentation.   Johnston, Amilio Zehnder Brynn   OTR/L Pager: [phone] Office: [phone] .   

## 2012-05-08 NOTE — Progress Notes (Signed)
Occupational Therapy Evaluation Patient Details Name: Trevor [name] MRN: [id number] DOB: 12-29-1924 Today's Date: 05/08/2012 Time: 4742-5956 OT Time Calculation (min): 25 min  OT Assessment / Plan / Recommendation Clinical Impression  Patient s/p afib with RVR, COPD, compression fx., and VDRF. Pt. will benefit from OT to increase independence and safety in ADLs prior to d/c.OT to follow acutely.    OT Assessment  Patient needs continued OT Services    Follow Up Recommendations  Skilled nursing facility    Barriers to Discharge      Equipment Recommendations  Defer to next venue    Recommendations for Other Services    Frequency  Min 2X/week    Precautions / Restrictions Precautions Precautions: Fall Restrictions Weight Bearing Restrictions: No   Pertinent Vitals/Pain RR reached 40 while sitting, but trended down. No pain reported.     ADL  Lower Body Bathing: Simulated;+1 Total assistance Where Assessed - Lower Body Bathing: Supported sitting Lower Body Dressing: Simulated;+1 Total assistance Where Assessed - Lower Body Dressing: Supported sitting Toilet Transfer: Simulated;Maximal assistance Toilet Transfer Method: Sit to stand Toilet Transfer Equipment: Raised toilet seat with arms (or 3-in-1 over toilet) Equipment Used: Gait belt;Rolling walker ADL Comments: Pt. required Max  A  for sit to stand. Required +1 total A for LB dressing.     OT Diagnosis: Generalized weakness  OT Problem List: Decreased strength;Decreased activity tolerance;Impaired balance (sitting and/or standing);Cardiopulmonary status limiting activity OT Treatment Interventions: Self-care/ADL training;Energy conservation;DME and/or AE instruction;Therapeutic activities;Patient/family education;Balance training   OT Goals Acute Rehab OT Goals OT Goal Formulation: With patient Time For Goal Achievement: 05/22/12 Potential to Achieve Goals: Fair ADL Goals Pt Will Perform Grooming: with  set-up;Sitting at sink ADL Goal: Grooming - Progress: Goal set today Pt Will Perform Upper Body Bathing: with set-up;Sitting at sink ADL Goal: Upper Body Bathing - Progress: Goal set today Pt Will Perform Lower Body Bathing: with min assist;Sit to stand from chair ADL Goal: Lower Body Bathing - Progress: Goal set today Pt Will Perform Upper Body Dressing: with set-up;Sitting, bed;Sitting, chair ADL Goal: Upper Body Dressing - Progress: Goal set today Pt Will Perform Lower Body Dressing: with min assist;Sit to stand from bed;Sit to stand from chair ADL Goal: Lower Body Dressing - Progress: Goal set today Pt Will Transfer to Toilet: with min assist;Ambulation ADL Goal: Toilet Transfer - Progress: Goal set today Pt Will Perform Toileting - Clothing Manipulation: Sitting on 3-in-1 or toilet;with supervision ADL Goal: Toileting - Clothing Manipulation - Progress: Goal set today Pt Will Perform Toileting - Hygiene: Sit to stand from 3-in-1/toilet;with min assist ADL Goal: Toileting - Hygiene - Progress: Goal set today  Visit Information  Last OT Received On: 05/08/12 Assistance Needed: +1    Subjective Data  Subjective: Pt. pleasant in session   Prior Functioning  Vision/Perception  Home Living Lives With: Spouse Available Help at Discharge: Family;Available PRN/intermittently Type of Home: House Home Access: Level entry Home Layout: Two level;Able to live on main level with bedroom/bathroom Bathroom Shower/Tub: Tub/shower unit;Walk-in shower;Door (door on walk in shower) Bathroom Toilet: Standard Bathroom Accessibility: Yes How Accessible: Accessible via walker Home Adaptive Equipment: Straight cane;Shower chair with back Prior Function Level of Independence: Independent with assistive device(s) Driving: Yes Vocation: Retired Musician: No difficulties Dominant Hand: Right      Cognition  Overall Cognitive Status: Appears within functional limits for tasks  assessed/performed Arousal/Alertness: Awake/alert Orientation Level: Appears intact for tasks assessed Behavior During Session: [facility] for tasks performed  Extremity/Trunk Assessment Right Upper Extremity Assessment RUE ROM/Strength/Tone: [facility] for tasks assessed Left Upper Extremity Assessment LUE ROM/Strength/Tone: WFL for tasks assessed   Mobility Bed Mobility Bed Mobility: Not assessed Transfers Transfers: Sit to Stand;Stand to Sit Sit to Stand: 2: Max assist;From chair/3-in-1 Stand to Sit: 3: Mod assist;To chair/3-in-1 Details for Transfer Assistance: vc's for hand placement and physical assist to stand. Required rocking a few times for momentum           End of Session OT - End of Session Activity Tolerance: Patient tolerated treatment well Patient left: in chair;with call bell/phone within reach  GO     Jenell Milliner 05/08/2012, 3:20 PM

## 2012-05-08 NOTE — Progress Notes (Signed)
Patient Name: Trevor [name]      SUBJECTIVE: admitted 7/3 for lethargy/weakness   Hx of  CHF with variable EF ( echo 55/ Myoview 44% 2011>>25% 2013 with MR and BAE))  in setting of ischemic cardiomypathy Hx COPD. Found to be in AFib  ON day3 Cardiac arrest 2/2 VT assoc with hypercarbia and hypotension>>intubation   Extubated and problems with chronic low BP  Was on fentanyl for compression fracture pain  He had hypoventilation/hyopercarbia>>arrest (7/16)requiring short term reintubation.  Tn vbumped a little bit but o/w neg.  Last few days notable for low bp--afterload had been added last week, now stopped  BP the first two weeks were fine   Past Medical History  Diagnosis Date  . CAD (coronary artery disease)     Remote inferior MI in 1984, complicated by VFIB requiring multiple shocks, cardiogenic shock and CHF  . CHF (congestive heart failure)     Last echo in 2005 with EF 50 to 55%  . Arrhythmia     Hx of VFIB in 1984  . Abnormal nuclear cardiac imaging test     Lexiscan in Jan 2011 with EF 44% and large area of infarct involving the inferoseptal basal, inferior and inferolateral wall.   . Iron deficiency anemia   . HTN (hypertension)   . Bladder cancer   . Hip fracture   . Chronic back pain   . Right bundle branch block     Chronic right bundle-branch block.   . History of tobacco abuse   . Cellulitis      Chronic cellulitis right ankle.  . Anemia   . CAD (coronary artery disease)     With remote large inferior MI dating back to 1984 with associated VFib arrest. Was not cathed.   . Dyslipidemia   . Nonhealing nonsurgical wound      Infected, chronic nonhealing wound, right ankle  . Postoperative ileus   . Hypercholesterolemia   . Hyponatremia   . Inguinal hernia     Strangulated recurrent right inguinal hernia  . Depression   . Osteoarthritis     severe  . Chronic constipation   . Palpitations   . Systolic heart failure     EF is 25% per echo in October 2012  with global hypokinesis and posterior and inferior akinesis, as well as grade 2 diastolic dysfunction, moderate TR and mild pulmonary HTN    PHYSICAL EXAM Filed Vitals:   05/08/12 0500 05/08/12 0600 05/08/12 0700 05/08/12 0720  BP: 93/43 87/58 109/53   Pulse: 67 76 79 78  Temp:      TempSrc:      Resp: 24 19 18 25   Height:      Weight:      SpO2: 100% 97% 99% 100%    General appearance: alert, cooperative and mild distress Neck: JVD - 8 cm above sternal notch, no adenopathy, no carotid bruit and supple, symmetrical, trachea midline Lungs: decreased BS on L wheeses tachypnea Heart: regular rate and rhythm and S2: normal, 3/6 sys m to >>apex Extremities: edema 2+ Pulses: not palpable Cap refill 3sec  Skin: Skin color, texture, turgor  As above Neurologic: Alert and oriented X 3, normal strength and tone. Normal symmetric reflexes. Normal coordination and gait  TELEMETRY: Reviewed telemetry pt in NSR:    Intake/Output Summary (Last 24 hours) at 05/08/12 0811 Last data filed at 05/08/12 0600  Gross per 24 hour  Intake  723.3 ml  Output   1050 ml  Net -326.7 ml    LABS: Basic Metabolic Panel:  Lab 05/08/12 1610 05/07/12 0515 05/06/12 2027 05/06/12 0605 05/05/12 0555 05/04/12 0500 05/03/12 0512  NA 144 139 139 139 140 136 139  K 4.2 5.1 4.4 4.9 3.7 3.0* 3.9  CL 104 102 99 100 99 98 102  CO2 38* 29 31 36* 38* 32 34*  GLUCOSE 137* 96 99 162* 139* 203* 118*  BUN 24* 33* 35* 25* 21 23 23   CREATININE 0.64 0.78 0.92 1.09 0.70 0.65 0.65  CALCIUM 8.3* 8.4 -- -- -- -- --  MG 2.1 -- 2.2 -- -- -- --  PHOS 2.4 -- -- -- -- -- --   Cardiac Enzymes:  Basename 05/07/12 0515 05/06/12 2027 05/06/12 1719  CKTOTAL 54 71 83  CKMB 5.6* 12.7* 11.9*  CKMBINDEX -- -- --  TROPONINI 0.31* 0.34* 0.49*   CBC:  Lab 05/08/12 0500 05/06/12 2027 05/02/12 0500  WBC 7.2 9.6 10.8*  NEUTROABS -- -- 8.9*  HGB 9.1* 11.4* 9.8*  HCT 27.6* 34.5* 30.7*  MCV 79.3 81.6 78.9  PLT 132* 115* 105*    PROTIME: No results found for this basename: LABPROT:3,INR:3 in the last 72 hours Liver Function Tests:  Basename 05/06/12 2027  AST 47*  ALT 30  ALKPHOS 90  BILITOT 0.7  PROT 6.6  ALBUMIN 2.3*      ASSESSMENT AND PLAN:  Patient Active Hospital Problem List:   Ischemic heart disease (03/20/2011)   Congestive heart failure with left ventricular systolic dysfunction (08/07/2011)   COPD (chronic obstructive pulmonary disease) (10/18/2011)   Atrial fibrillation (04/24/2012)   Respiratory failure with hypercapnia (04/24/2012)       NSTEMI (non-ST elevated myocardial infarction)   Hypotension (05/07/2012)   Pt is hypotnesive and this is new.  I dont understand, but worry with ongoing CP that this could be ischemia Hgb down t2o grams in last two days; BUN elevated is this a clue?  Will check enzymes CXR Repeat echo guaic stool Follow HgB    Signed, Sherryl Manges MD  05/08/2012

## 2012-05-09 DIAGNOSIS — R079 Chest pain, unspecified: Secondary | ICD-10-CM

## 2012-05-09 DIAGNOSIS — I2510 Atherosclerotic heart disease of native coronary artery without angina pectoris: Secondary | ICD-10-CM

## 2012-05-09 LAB — BASIC METABOLIC PANEL
BUN: 20 mg/dL (ref 6–23)
Calcium: 8 mg/dL — ABNORMAL LOW (ref 8.4–10.5)
GFR calc non Af Amer: >90 mL/min (ref >90)
Glucose, Bld: 131 mg/dL — ABNORMAL HIGH (ref 70–99)

## 2012-05-09 LAB — CBC
MCH: 25.5 pg — ABNORMAL LOW (ref 26.0–34.0)
MCHC: 32.6 g/dL (ref 30.0–36.0)
Platelets: 134 10*3/uL — ABNORMAL LOW (ref 150–400)

## 2012-05-09 LAB — MAGNESIUM: Magnesium: 2 mg/dL (ref 1.5–2.5)

## 2012-05-09 MED ORDER — K PHOS MONO-SOD PHOS DI & MONO 155-852-130 MG PO TABS
250.0000 mg | ORAL_TABLET | Freq: Three times a day (TID) | ORAL | Status: AC
  Administered 2012-05-09 – 2012-05-11 (×6): 250 mg via ORAL
  Filled 2012-05-09 (×6): qty 1

## 2012-05-09 MED ORDER — IPRATROPIUM-ALBUTEROL 18-103 MCG/ACT IN AERO
2.0000 | INHALATION_SPRAY | RESPIRATORY_TRACT | Status: DC | PRN
  Filled 2012-05-09: qty 14.7

## 2012-05-09 MED ORDER — HYDROCORTISONE SOD SUCCINATE 100 MG IJ SOLR
25.0000 mg | Freq: Two times a day (BID) | INTRAMUSCULAR | Status: DC
  Administered 2012-05-09: 25 mg via INTRAVENOUS
  Filled 2012-05-09 (×4): qty 0.5

## 2012-05-09 MED ORDER — POTASSIUM PHOSPHATE DIBASIC 3 MMOLE/ML IV SOLN
20.0000 mmol | Freq: Once | INTRAVENOUS | Status: DC
  Filled 2012-05-09: qty 6.67

## 2012-05-09 MED ORDER — POTASSIUM CHLORIDE CRYS ER 20 MEQ PO TBCR
40.0000 meq | EXTENDED_RELEASE_TABLET | Freq: Once | ORAL | Status: AC
  Administered 2012-05-09: 40 meq via ORAL
  Filled 2012-05-09: qty 2

## 2012-05-09 NOTE — Progress Notes (Signed)
PROGRESS NOTE  Subjective:   Pt is an 78 yo admitted with lethergy.  Found to have AF with RVR and systolic CHF - EF 25%.  Hx of COPD.  He has chronic back pain and a compression fx in his spine .  The Fentanyl is not holding him for very long.    Nursing has requested a Fentanyl patch.   Unfortunately, he had a respiratory arrest.  There was some thought that it was due to the Fentanyl patch but he did not improve with Narcan and required intubation overnight.  He is feeling better this am.  The Fentanyl patch has been DCd.   Objective:    Vital Signs:   Temp:  [98.1 F (36.7 C)-98.6 F (37 C)] 98.5 F (36.9 C) (07/19 0400) Pulse Rate:  [59-80] 69  (07/19 0709) Resp:  [14-37] 14  (07/19 0709) BP: (102-134)/(41-63) 132/63 mmHg (07/19 0700) SpO2:  [94 %-100 %] 98 % (07/19 0709) Weight:  [174 lb 2.6 oz (79 kg)] 174 lb 2.6 oz (79 kg) (07/19 0451)  Last BM Date: 05/05/12   24-hour weight change: Weight change: 1 lb 1.6 oz (0.5 kg)  Weight trends: Filed Weights   05/07/12 0500 05/08/12 0436 05/09/12 0451  Weight: 171 lb 11.8 oz (77.9 kg) 173 lb 1 oz (78.5 kg) 174 lb 2.6 oz (79 kg)    Intake/Output:  07/18 0701 - 07/19 0700 In: 399.4 [P.O.:370; I.V.:27.4; IV Piggyback:2] Out: 1785 [Urine:1785]     Physical Exam: BP 132/63  Pulse 69  Temp 98.5 F (36.9 C) (Oral)  Resp 14  Ht 5\' 1"  (1.549 m)  Wt 174 lb 2.6 oz (79 kg)  BMI 32.91 kg/m2  SpO2 98%    Currently 05/09/2012 7:55 AM  HR =78, BP = 135/78 General: Vital signs reviewed and noted. Well-developed, well-nourished, in no acute distress; alert, appropriate and cooperative . Hoarse voice  Head: Normocephalic, atraumatic.  Eyes: conjunctivae/corneas clear.  EOM's intact.   Throat: normal  Neck: Supple. Normal carotids. No JVD  Lungs:  Decreased breath sounds. Consolidation in right base. E to A changes, rales in left base.  Heart: Regular rate,  With normal  S1 S2. No murmurs, gallops or rubs  Abdomen:  Soft,  non-tender, non-distended with normoactive bowel sounds. No hepatomegaly. No rebound/guarding. No abdominal masses.  Extremities: Distal pedal pulses are 2+ .  No edema.    Neurologic: A&O X3, CN II - XII are grossly intact. Motor strength is 5/5 in the all 4 extremities.  Psych: Responds to questions appropriately with normal affect.    Labs: BMET:  Basename 05/09/12 0622 05/08/12 0500  NA 142 144  K 3.3* 4.2  CL 101 104  CO2 36* 38*  GLUCOSE 131* 137*  BUN 20 24*  CREATININE 0.58 0.64  CALCIUM 8.0* 8.3*  MG 2.0 2.1  PHOS 1.8* 2.4    Liver function tests:  [facility] 05/06/12 2027  AST 47*  ALT 30  ALKPHOS 90  BILITOT 0.7  PROT 6.6  ALBUMIN 2.3*   No results found for this basename: LIPASE:2,AMYLASE:2 in the last 72 hours  CBC:  Basename 05/09/12 0622 05/08/12 0500  WBC 7.3 7.2  NEUTROABS -- --  HGB 9.5* 9.1*  HCT 29.1* 27.6*  MCV 78.2 79.3  PLT 134* 132*    Cardiac Enzymes:  Basename 05/08/12 0855 05/07/12 0515 05/06/12 2027 05/06/12 1719  CKTOTAL 31 54 71 83  CKMB 4.7* 5.6* 12.7* 11.9*  TROPONINI <0.30 0.31* 0.34* 0.49*  Coagulation Studies: No results found for this basename: LABPROT:5,INR:5 in the last 72 hours  Other:  Tele: 05/09/2012  NSR  Medications:    Infusions:    . DOPamine Stopped (05/08/12 0805)    Scheduled Medications:    . a stronger pump book   Does not apply Once  . albuterol-ipratropium  2 puff Inhalation TID  . aspirin EC  81 mg Oral Daily  . atorvastatin  20 mg Oral q1800  . calcium carbonate  1 tablet Oral BID  . clopidogrel  75 mg Oral Q breakfast  . docusate sodium  100 mg Oral BID  . feeding supplement  1 Container Oral BID BM  . ferrous sulfate  325 mg Oral Q breakfast  . furosemide  20 mg Intravenous Q8H  . hydrocortisone sodium succinate  25 mg Intravenous Q6H  . multivitamin with minerals  1 tablet Oral Daily  . pantoprazole  40 mg Oral QHS  . sodium chloride  3 mL Intravenous Q12H  . tiotropium  18  mcg Inhalation Daily  . DISCONTD: antiseptic oral rinse  15 mL Mouth Rinse QID  . DISCONTD: aspirin  81 mg Per Tube Daily  . DISCONTD: calcium carbonate (dosed in mg elemental calcium)  500 mg of elemental calcium Per Tube BID  . DISCONTD: docusate  100 mg Per Tube BID  . DISCONTD: heparin subcutaneous  5,000 Units Subcutaneous Q8H  . DISCONTD: multivitamin  5 mL Per Tube Daily  . DISCONTD: pantoprazole sodium  40 mg Per Tube QHS    Assessment/ Plan:   1.  Congestive heart failure with left ventricular systolic dysfunction (08/07/2011)  he slowly seems to be improving  Ischemic heart disease (03/20/2011) No cp  COPD (chronic obstructive pulmonary disease) (10/18/2011) He has very little respiratory reserve.  Plans per PCCM.  Atrial fibrillation (04/24/2012) He has converted back to NSR  Respiratory failure with hypercapnia (04/24/2012) Plans per PCCM  Hypokalemia:  Will replace.    Disposition: transfer to step down today. Length of Stay: 43  Vesta Mixer, Montez Hageman., MD, [facility] 05/09/2012, 7:55 AM Office [phone] Pager [phone]

## 2012-05-09 NOTE — Progress Notes (Signed)
Physical Therapy Treatment Patient Details Name: Trevor [name] MRN: [id number] DOB: [birth year] Today's Date: 05/09/2012 Time: 4098-1191 PT Time Calculation (min): 35 min  PT Assessment / Plan / Recommendation Comments on Treatment Session  Patient s/p afib with RVR, COPD, compression fx, and VDRF.  Will benefit from continued PT to address balance and endurance.  Did progress ambulation today.     Follow Up Recommendations  Skilled nursing facility;Supervision/Assistance - 24 hour    Barriers to Discharge        Equipment Recommendations  Defer to next venue    Recommendations for Other Services    Frequency Min 3X/week   Plan Discharge plan remains appropriate;Frequency remains appropriate    Precautions / Restrictions Precautions Precautions: Fall Restrictions Weight Bearing Restrictions: No   Pertinent Vitals/Pain VSS, No pain    Mobility  Bed Mobility Bed Mobility: Rolling Right;Right Sidelying to Sit;Sitting - Scoot to [place] of Bed Rolling Right: 4: Min assist Right Sidelying to Sit: 4: Min assist;With rails;HOB elevated Sitting - Scoot to Edge of Bed: 4: Min assist Sit to Sidelying Right: Not Tested (comment) Details for Bed Mobility Assistance: cues for technique and moving LEs off bed. Transfers Transfers: Sit to Stand;Stand to Sit;Stand Pivot Transfers Sit to Stand: 4: Min assist;From elevated surface;With upper extremity assist;From bed Stand to Sit: 4: Min assist;With upper extremity assist;To chair/3-in-1;With armrests Stand Pivot Transfers: 4: Min assist;With armrests Details for Transfer Assistance: Patient needed cues for hand placement.  Patient needed steadying assist to stand.  Patient stood and turned to 3N1 with steadying assist and had BM.  Needed tot A to be cleaned but held balance in static stance for 4 minutes.   Ambulation/Gait Ambulation/Gait Assistance: 4: Min guard Ambulation Distance (Feet): 65 Feet Assistive device: Rolling  walker Ambulation/Gait Assistance Details: Continues with flexed posture and needs cues to stand as tall as he can.  Verbal cues and manual assist to steer RW at times.   Gait Pattern: Step-to pattern;Decreased stride length;Shuffle;Trunk flexed;Wide base of support Gait velocity: decreased Stairs: No Wheelchair Mobility Wheelchair Mobility: No      PT Goals Acute Rehab PT Goals PT Goal: Supine/Side to Sit - Progress: Progressing toward goal PT Goal: Sit to Stand - Progress: Progressing toward goal PT Goal: Stand to Sit - Progress: Progressing toward goal PT Goal: Ambulate - Progress: Progressing toward goal  Visit Information  Last PT Received On: 05/09/12 Assistance Needed: +1    Subjective Data  Subjective: "I will try to get to the potty chair."   Cognition  Overall Cognitive Status: Appears within functional limits for tasks assessed/performed Arousal/Alertness: Awake/alert Orientation Level: Appears intact for tasks assessed Behavior During Session: [facility] for tasks performed    Balance  Static Standing Balance Static Standing - Balance Support: Bilateral upper extremity supported;During functional activity Static Standing - Level of Assistance: 5: Stand by assistance Static Standing - Comment/# of Minutes: 4 minutes  End of Session PT - End of Session Equipment Utilized During Treatment: Gait belt Activity Tolerance: Patient tolerated treatment well Patient left: in chair;with call bell/phone within reach Nurse Communication: Mobility status       Trevor [name],Trevor [name] 05/09/2012, 10:26 AM  [name] [phone] (pager)

## 2012-05-09 NOTE — Progress Notes (Signed)
Nutrition Follow-up  Intervention:   1. Continue Resource Breeze BID 2. RD will continue to follow    Assessment:   Pt required re-intubation on 7/16, with extubation on 7/17. Pt doing well after extubation, likely to transfer to stepdown today.  PO intake good, 60-95 % when documented.   Diet Order:  Heart Supplements: Resource Breeze BID  Meds: Scheduled Meds:   . a stronger pump book   Does not apply Once  . albuterol-ipratropium  2 puff Inhalation TID  . aspirin EC  81 mg Oral Daily  . atorvastatin  20 mg Oral q1800  . calcium carbonate  1 tablet Oral BID  . clopidogrel  75 mg Oral Q breakfast  . docusate sodium  100 mg Oral BID  . feeding supplement  1 Container Oral BID BM  . ferrous sulfate  325 mg Oral Q breakfast  . furosemide  20 mg Intravenous Q8H  . hydrocortisone sodium succinate  25 mg Intravenous Q6H  . multivitamin with minerals  1 tablet Oral Daily  . pantoprazole  40 mg Oral QHS  . potassium chloride  40 mEq Oral Once  . sodium chloride  3 mL Intravenous Q12H  . tiotropium  18 mcg Inhalation Daily  . DISCONTD: antiseptic oral rinse  15 mL Mouth Rinse QID   Continuous Infusions:   . DOPamine Stopped (05/08/12 0805)   PRN Meds:.sodium chloride, acetaminophen (TYLENOL) oral liquid 160 mg/5 mL, albuterol, nitroGLYCERIN, ondansetron (ZOFRAN) IV, polyethylene glycol, sodium chloride  Labs:  CMP     Component Value Date/Time   NA 142 05/09/2012 0622   K 3.3* 05/09/2012 0622   CL 101 05/09/2012 0622   CO2 36* 05/09/2012 0622   GLUCOSE 131* 05/09/2012 0622   BUN 20 05/09/2012 0622   CREATININE 0.58 05/09/2012 0622   CALCIUM 8.0* 05/09/2012 0622   PROT 6.6 05/06/2012 2027   ALBUMIN 2.3* 05/06/2012 2027   AST 47* 05/06/2012 2027   ALT 30 05/06/2012 2027   ALKPHOS 90 05/06/2012 2027   BILITOT 0.7 05/06/2012 2027   GFRNONAA >90 05/09/2012 0622   GFRAA >90 05/09/2012 0622     Intake/Output Summary (Last 24 hours) at 05/09/12 1114 Last data filed at 05/09/12 1045  Gross per 24 hour  Intake    730 ml  Output   1618 ml  Net   -888 ml    Weight Status:  174 lbs, stable x 6 days   Re-estimated needs:  1600-1800 kcal, 80-90 gm protein   Nutrition Dx:  Inadequate oral intake, resolving   Goal:  PO intake of meals and supplements to meet >90% of estimated nutrition needs, likely being met  Monitor:  PO intake, weight, labs   Clarene Duke RD, LDN Pager [phone] After Hours pager [phone]

## 2012-05-09 NOTE — Progress Notes (Signed)
Name: Trevor [name] MRN: [id number] DOB: 1925-07-24    LOS: 16  PULMONARY / CRITICAL CARE MEDICINE   HPI:   78 year old male with PMH relevant for CAD, systolic CHF, Vfib, HTN, COPD Admitted initially to the Cardiology service for management of acute decompensated CHF. He was also started on heparin drip for new A. Fib.  code blue was called. He developed Vtach requiring CPR for a brief period of time with return to spontaneous circulation. He was intubated. 7/16 PCCM called bact to evaluate or lethargy post Tx with amitriptyline and Iv fentanyl.  remainss extubated, no distress  Vital Signs: Temp:  [98.2 F (36.8 C)-98.6 F (37 C)] 98.5 F (36.9 C) (07/19 0400) Pulse Rate:  [57-80] 72  (07/19 1200) Resp:  [14-37] 32  (07/19 1200) BP: (102-153)/(15-63) 120/50 mmHg (07/19 1200) SpO2:  [94 %-100 %] 99 % (07/19 1200) Weight:  [79 kg (174 lb 2.6 oz)] 79 kg (174 lb 2.6 oz) (07/19 0451)  Physical Examination: Gen: no distress HEENT: NCAT, old lt ij site noted. PULM: Coarse BS diffusely unchanged CV: RRR, no mgr AB: BS+, soft, nontender, no hsm Ext: warm, scd's Neuro:awake in chair eating  Intake/Output Summary (Last 24 hours) at 05/09/12 1244 Last data filed at 05/09/12 1045  Gross per 24 hour  Intake    730 ml  Output   1618 ml  Net   -888 ml   Principal Problem:  *Lethargy Active Problems:  Ischemic heart disease  Congestive heart failure with left ventricular systolic dysfunction  COPD (chronic obstructive pulmonary disease)  Atrial fibrillation  Respiratory failure with hypercapnia  Acute respiratory failure with hypoxia  Shock  NSTEMI (non-ST elevated myocardial infarction)  Hypotension  ASSESSMENT AND PLAN 78 y/o male with CHF, CAD and likely COPD admitted 7/3 with lethargy but developed respiratory failure on 7/5 leading to PEA arrest and VTach. He was intubated on 7/16 after an overdose of narcotics and amitriptyline. 7/16 increased lethargy from presumed  overdose, resolved  PULMONARY  Lab 05/07/12 0922 05/07/12 0342 05/06/12 1510 05/06/12 0932  PHART 7.419 7.454* 7.402 7.247*  PCO2ART 56.7* 51.9* 58.3* 86.8*  PO2ART 178.0* 87.0 159.0* 53.9*  HCO3 36.0* 35.9* 36.3* 36.5*  O2SAT 100.0 97.6 99.0 89.4   Ventilator Settings:    ETT 7/16>>>7/17  A:   1) Acute hypercarbic and hypoxemic respiratory failure secondary to fentanyl and amitriptyline. 2) COPD with evidence of chronic hypercarbia. 3) Hx of  Pneumonia: aspiration vs. HCAP completed course of abx.  P:   - Titrate O2 for sat of 88-92%. - No further sedating drugs. - IS and flutter valve important with such small lung volumes Neg balance per cards  CARDIOVASCULAR  Lab 05/08/12 0855 05/07/12 0515 05/06/12 2027 05/06/12 1719  TROPONINI <0.30 0.31* 0.34* 0.49*  LATICACIDVEN -- -- -- --  PROBNP -- -- -- --   ECG:  Sinus rhythm Lines: Left IJ 04/25/12 >>out  A:  Shock, likely multifactorial but narcs are playing a factor here and specially more pronounced after intubation. Bedside echocardiogram with 25% LVEF.  Chest X ray with bilateral diffuse infiltrates but unlikely PNA. P:  Dopamine off KVO IVF. On hydrocortisone tapper as off pressors, taper over next 2-3 days to off  RENAL  Lab 05/09/12 0622 05/08/12 0500 05/07/12 0515 05/06/12 2027 05/06/12 0605  NA 142 144 139 139 139  K 3.3* 4.2 -- -- --  CL 101 104 102 99 100  CO2 36* 38* 29 31 36*  BUN 20  24* 33* 35* 25*  CREATININE 0.58 0.64 0.78 0.92 1.09  CALCIUM 8.0* 8.3* 8.4 8.7 8.4  MG 2.0 2.1 -- 2.2 --  PHOS 1.8* 2.4 -- -- --   Intake/Output      07/18 0701 - 07/19 0700 07/19 0701 - 07/20 0700   P.O. 370 360   I.V. (mL/kg) 27.4 (0.3)    IV Piggyback 2    Total Intake(mL/kg) 399.4 (5.1) 360 (4.6)   Urine (mL/kg/hr) 1785 (0.9) 182 (0.4)   Stool  1   Total Output 1785 183   Net -1385.6 +177         Foley:  Placed 04/25/12>>out  A:   Creatinine improved, would need neg balance to continue if able  P:     Lab Results  Component Value Date   CREATININE 0.58 05/09/2012   CREATININE 0.64 05/08/2012   CREATININE 0.78 05/07/2012  - lasix per cards reaplce K , phos  GASTROINTESTINAL  Lab 05/06/12 2027  AST 47*  ALT 30  ALKPHOS 90  BILITOT 0.7  PROT 6.6  ALBUMIN 2.3*    A:  Protein calorie malnutrition. P:   - GI prophylaxis - Cardiac diet.  HEMATOLOGIC  Lab 05/09/12 0622 05/08/12 0500 05/06/12 2027  HGB 9.5* 9.1* 11.4*  HCT 29.1* 27.6* 34.5*  PLT 134* 132* 115*  INR -- -- --  APTT -- -- --   A:  1) Anemia dilutional No role Tx Cbc per cards   INFECTIOUS  Lab 05/09/12 0622 05/08/12 0500 05/06/12 2027  WBC 7.3 7.2 9.6  PROCALCITON -- -- --   Cultures: Blood, tracheal and urine cultures sent.  Antibiotics: - Zosyn (04/25/12)>>>7/9 - Ceftriaxone 7/9>>>plan stop 7/11 - Vancomycin (04/25/12)  >> 7/7  A:  1) Shock, cardiogenic vs septic (doubt) 2) Pneumonia: HCAP vs. aspiration P:   Low dose lasix per cards No fevers and WBC stable.  ENDOCRINE No results found for this basename: GLUCAP:5 in the last 168 hours A:   No history of diabetes Controlled on own  NEUROLOGIC  A:   Drug induced lethargy that is much improved now. P:   -See pulmonary Needs pt, did today  BEST PRACTICE / DISPOSITION - Level of Care:  Floor to ICU 7/16. - Primary Service:  Cardiology. - Consultants:  CCM. - Code Status:  DNR/short term intubation ok . - Diet: npo - DVT Px:  hep - GI Px:  Protonix - Skin Integrity:  Intact.  Will sign off call if needed  Nelda Bucks., M.D. [phone]

## 2012-05-10 LAB — BASIC METABOLIC PANEL
BUN: 19 mg/dL (ref 6–23)
BUN: 20 mg/dL (ref 6–23)
CO2: 34 mEq/L — ABNORMAL HIGH (ref 19–32)
Chloride: 104 mEq/L (ref 96–112)
Creatinine, Ser: 0.62 mg/dL (ref 0.50–1.35)
GFR calc Af Amer: >90 mL/min (ref >90)
GFR calc non Af Amer: 89 mL/min — ABNORMAL LOW (ref >90)
GFR calc non Af Amer: 89 mL/min — ABNORMAL LOW (ref >90)
Glucose, Bld: 117 mg/dL — ABNORMAL HIGH (ref 70–99)
Glucose, Bld: 183 mg/dL — ABNORMAL HIGH (ref 70–99)
Potassium: 3 mEq/L — ABNORMAL LOW (ref 3.5–5.1)

## 2012-05-10 MED ORDER — HYDROCORTISONE SOD SUCCINATE 100 MG IJ SOLR
12.5000 mg | Freq: Two times a day (BID) | INTRAMUSCULAR | Status: DC
  Administered 2012-05-10 – 2012-05-18 (×18): 12.5 mg via INTRAVENOUS
  Administered 2012-05-19: 11:00:00 via INTRAVENOUS
  Filled 2012-05-10 (×21): qty 0.25

## 2012-05-10 MED ORDER — POTASSIUM CHLORIDE CRYS ER 20 MEQ PO TBCR
40.0000 meq | EXTENDED_RELEASE_TABLET | Freq: Once | ORAL | Status: AC
  Administered 2012-05-10: 40 meq via ORAL
  Filled 2012-05-10: qty 2

## 2012-05-10 MED ORDER — POTASSIUM CHLORIDE CRYS ER 20 MEQ PO TBCR: 40.0000 meq | EXTENDED_RELEASE_TABLET | Freq: Once | ORAL | Status: AC

## 2012-05-10 MED ORDER — FUROSEMIDE 20 MG PO TABS
20.0000 mg | ORAL_TABLET | Freq: Every day | ORAL | Status: DC
  Administered 2012-05-10 – 2012-05-11 (×2): 20 mg via ORAL
  Filled 2012-05-10 (×3): qty 1

## 2012-05-10 MED ORDER — CLOPIDOGREL BISULFATE 75 MG PO TABS
75.0000 mg | ORAL_TABLET | Freq: Every day | ORAL | Status: DC
  Administered 2012-05-10 – 2012-05-19 (×10): 75 mg via ORAL
  Filled 2012-05-10 (×12): qty 1

## 2012-05-10 MED ORDER — POTASSIUM CHLORIDE CRYS ER 20 MEQ PO TBCR
20.0000 meq | EXTENDED_RELEASE_TABLET | Freq: Once | ORAL | Status: AC
  Administered 2012-05-10: 20 meq via ORAL
  Filled 2012-05-10: qty 1

## 2012-05-10 MED ORDER — LOSARTAN POTASSIUM 25 MG PO TABS
12.5000 mg | ORAL_TABLET | Freq: Every day | ORAL | Status: DC
  Administered 2012-05-10 – 2012-05-17 (×8): 12.5 mg via ORAL
  Administered 2012-05-18 – 2012-05-19 (×2): via ORAL
  Filled 2012-05-10 (×10): qty 0.5

## 2012-05-10 NOTE — Progress Notes (Signed)
Patient ID: Trevor [name], male   DOB: [birth year], 78 y.o.   MRN: [id number]    SUBJECTIVE: Awake/alert, doing well this morning.  No complaints.  He is in NSR.     Filed Vitals:   05/10/12 0400 05/10/12 0500 05/10/12 0600 05/10/12 0715  BP: 107/50 116/55 113/63   Pulse: 64 67 63   Temp:      TempSrc:      Resp: 21 27 23    Height:      Weight:  173 lb 1 oz (78.5 kg)    SpO2: 100% 100% 100% 100%    Intake/Output Summary (Last 24 hours) at 05/10/12 0728 Last data filed at 05/10/12 0500  Gross per 24 hour  Intake    840 ml  Output    556 ml  Net    284 ml    LABS: Basic Metabolic Panel:  Basename 05/10/12 0535 05/09/12 0622  NA 145 142  K 3.2* 3.3*  CL 104 101  CO2 38* 36*  GLUCOSE 117* 131*  BUN 19 20  CREATININE 0.62 0.58  CALCIUM 7.9* 8.0*  MG 2.1 2.0  PHOS 1.8* 1.8*   Liver Function Tests: No results found for this basename: AST:2,ALT:2,ALKPHOS:2,BILITOT:2,PROT:2,ALBUMIN:2 in the last 72 hours No results found for this basename: LIPASE:2,AMYLASE:2 in the last 72 hours CBC:  Basename 05/09/12 0622 05/08/12 0500  WBC 7.3 7.2  NEUTROABS -- --  HGB 9.5* 9.1*  HCT 29.1* 27.6*  MCV 78.2 79.3  PLT 134* 132*   Cardiac Enzymes:  Basename 05/08/12 0855  CKTOTAL 31  CKMB 4.7*  CKMBINDEX --  TROPONINI <0.30   BNP: No components found with this basename: POCBNP:3 D-Dimer: No results found for this basename: DDIMER:2 in the last 72 hours Hemoglobin A1C: No results found for this basename: HGBA1C in the last 72 hours Fasting Lipid Panel: No results found for this basename: CHOL,HDL,LDLCALC,TRIG,CHOLHDL,LDLDIRECT in the last 72 hours Thyroid Function Tests: No results found for this basename: TSH,T4TOTAL,FREET3,T3FREE,THYROIDAB in the last 72 hours Anemia Panel: No results found for this basename: VITAMINB12,FOLATE,FERRITIN,TIBC,IRON,RETICCTPCT in the last 72 hours  RADIOLOGY:  Dg Chest Port 1v Same Day  05/08/2012  *RADIOLOGY REPORT*  Clinical Data:  COPD.  Respiratory failure.  CHEST - 1 VIEW  Comparison: 05/07/2012  Findings: Exam is technically suboptimal due to low lung volumes and patient habitus.  Increased gastric distention is seen. Further decrease in lung volumes is seen with increased bibasilar atelectasis.  Cardiomegaly stable.  Central pulmonary vascular congestion or mild interstitial edema again noted.  Position of patient and underpenetration make evaluation of the mediastinum difficult, but no definite endotracheal tube or nasogastric tube seen on this exam.  IMPRESSION:  1.  Further decreased lung volumes with increased bibasilar atelectasis. 2.  Increased gastric distention. 3.  No significant change in cardiomegaly and central pulmonary vascular congestion versus mild interstitial edema.  Original Report Authenticated By: Danae Orleans, M.D.    PHYSICAL EXAM General: NAD Neck: JVP 10cm, no thyromegaly or thyroid nodule.  Lungs: Clear to auscultation bilaterally with normal respiratory effort. CV: Nondisplaced PMI.  Heart regular S1/S2, no S3/S4, no murmur.  1+ ankle edema.  No carotid bruit.  Normal pedal pulses.  Abdomen: Soft, nontender, no hepatosplenomegaly, no distention.  Neurologic: Alert and oriented x 3.  Psych: Normal affect. Extremities: No clubbing or cyanosis.   TELEMETRY: Reviewed telemetry pt in NSR  ASSESSMENT AND PLAN:  78 yo with h/o CAD, COPD admitted with lethargy and new-onset atrial fibrillation.  Course complicated by cardiac arrest/VT and intubation.  Now extubated and doing better.  1. Altered mental status: Initially due to narcotics and amitriptyline most likely.  Would avoid in the future.  Clear mentation now.  2. Atrial fibrillation: Has been back in sinus rhythm.  He is on ASA and Plavix.  Will defer decision on anticoagulation to Dr. Patty Sermons => likely will need to see how stable he is going to be.  3. CHF: Acute/chronic systolic CHF.  He is volume overloaded but comfortable.  EF 25%.  Add  low dose ACEI and po Lasix.  4. CAD: Stable.  5. Hypotension: Resolved.  He is still on steroids for ? Component of adrenal insufficiency.  Taper down steroids.  6. Needs PT/OT, will transfer to telemetry.   [name] 05/10/2012 7:37 AM

## 2012-05-11 LAB — BASIC METABOLIC PANEL
BUN: 20 mg/dL (ref 6–23)
Chloride: 102 mEq/L (ref 96–112)
Creatinine, Ser: 0.63 mg/dL (ref 0.50–1.35)
GFR calc Af Amer: >90 mL/min (ref >90)
Glucose, Bld: 137 mg/dL — ABNORMAL HIGH (ref 70–99)

## 2012-05-11 NOTE — Progress Notes (Signed)
   Subjective: Up in chair. Feels better. No chest pain, but some soreness.   Objective: Blood pressure 130/85, pulse 86, temperature 97.3 F (36.3 C), temperature source Oral, resp. rate 24, height 5\' 1"  (1.549 m), weight 176 lb 9.4 oz (80.1 kg), SpO2 94.00%.  Intake/Output Summary (Last 24 hours) at 05/11/12 0925 Last data filed at 05/11/12 0835  Gross per 24 hour  Intake   1303 ml  Output   1105 ml  Net    198 ml    Telemetry - Sinus with limited episode bradycardia overnight.  Exam -   General - Elderly, NAD.  Lungs - Decreased, course BS.  Cardiac - RRR.  Extremities - Trace edema.  Lab Results -  Basic Metabolic Panel:  Lab 05/11/12 1324 05/10/12 1518 05/10/12 0535 05/09/12 0622 05/08/12 0500  NA 141 138 145 -- --  K 3.8 3.0* 3.2* -- --  CL 102 98 104 -- --  CO2 33* 34* 38* -- --  GLUCOSE 137* 183* 117* -- --  BUN 20 20 19  -- --  CREATININE 0.63 0.61 0.62 -- --  CALCIUM 8.0* 7.6* 7.9* -- --  MG -- -- 2.1 2.0 2.1    Liver Function Tests:  Lab 05/06/12 2027  AST 47*  ALT 30  ALKPHOS 90  BILITOT 0.7  PROT 6.6  ALBUMIN 2.3*    CBC:  Lab 05/09/12 0622 05/08/12 0500 05/06/12 2027  WBC 7.3 7.2 9.6  HGB 9.5* 9.1* 11.4*  HCT 29.1* 27.6* 34.5*  MCV 78.2 79.3 81.6  PLT 134* 132* 115*    Cardiac Enzymes:  Lab 05/08/12 0855 05/07/12 0515 05/06/12 2027  CKTOTAL 31 54 71  CKMB 4.7* 5.6* 12.7*  CKMBINDEX -- -- --  TROPONINI <0.30 0.31* 0.34*    Current Medications    . a stronger pump book   Does not apply Once  . aspirin EC  81 mg Oral Daily  . atorvastatin  20 mg Oral q1800  . calcium carbonate  1 tablet Oral BID  . clopidogrel  75 mg Oral Q breakfast  . docusate sodium  100 mg Oral BID  . feeding supplement  1 Container Oral BID BM  . ferrous sulfate  325 mg Oral Q breakfast  . furosemide  20 mg Oral Daily  . hydrocortisone sodium succinate  12.5 mg Intravenous Q12H  . losartan  12.5 mg Oral Daily  . multivitamin with minerals  1  tablet Oral Daily  . pantoprazole  40 mg Oral QHS  . phosphorus  250 mg Oral TID  . potassium chloride  20 mEq Oral Once  . sodium chloride  3 mL Intravenous Q12H  . tiotropium  18 mcg Inhalation Daily    Assessment:  1. Atrial fibrillation - back in sinus rhythm. He is on ASA and Plavix for now. Will defer decision on anticoagulation to Dr. Patty Sermons - likely will need to see how stable he is going to be.  Some bradycardia overnight, not on rate lowering agent.  3. Acute/chronic systolic CHF. He is somewhat volume overloaded but comfortable. EF 25%. On ARB and Lasix resumed.  4. CAD: Stable.   5. Deconditioning. Needs PT/OT.   Plan:  ASA, Lipitor, Plavix, Lasix, Cozaar. Work with PT/OT. Insentive spirometer. Plan to taper steroids.  Jonelle Sidle, M.D., F.A.C.C.

## 2012-05-11 NOTE — Progress Notes (Signed)
Pts. Heart rate down to 33 not sustained.  Pt. Asleep and asymptomatic in bed.  VSS.  HR sustaining NSR 60s-70s.  Will continue to monitor.

## 2012-05-12 DIAGNOSIS — I214 Non-ST elevation (NSTEMI) myocardial infarction: Secondary | ICD-10-CM

## 2012-05-12 LAB — BASIC METABOLIC PANEL
BUN: 17 mg/dL (ref 6–23)
CO2: 27 mEq/L (ref 19–32)
Calcium: 7.8 mg/dL — ABNORMAL LOW (ref 8.4–10.5)
Chloride: 103 mEq/L (ref 96–112)
Creatinine, Ser: 0.56 mg/dL (ref 0.50–1.35)
Glucose, Bld: 114 mg/dL — ABNORMAL HIGH (ref 70–99)

## 2012-05-12 LAB — CBC
HCT: 30.7 % — ABNORMAL LOW (ref 39.0–52.0)
Hemoglobin: 10 g/dL — ABNORMAL LOW (ref 13.0–17.0)
MCHC: 32.6 g/dL (ref 30.0–36.0)
RBC: 3.87 MIL/uL — ABNORMAL LOW (ref 4.22–5.81)

## 2012-05-12 MED ORDER — ALBUTEROL SULFATE HFA 108 (90 BASE) MCG/ACT IN AERS: 2.0000 | INHALATION_SPRAY | RESPIRATORY_TRACT | Status: DC

## 2012-05-12 MED ORDER — FUROSEMIDE 10 MG/ML IJ SOLN
40.0000 mg | Freq: Every day | INTRAMUSCULAR | Status: DC
  Administered 2012-05-12: 40 mg via INTRAVENOUS
  Filled 2012-05-12 (×2): qty 4

## 2012-05-12 MED ORDER — ALBUTEROL SULFATE HFA 108 (90 BASE) MCG/ACT IN AERS
2.0000 | INHALATION_SPRAY | RESPIRATORY_TRACT | Status: DC | PRN
  Filled 2012-05-12: qty 6.7

## 2012-05-12 NOTE — Progress Notes (Addendum)
Clinical Social Worker reviewed chart and followed up with patient and family regarding decision for SNF placement. Patient reports that he was told of anticipated discharge of Wednesday or Thursday. Patient reported that his preference is [facility] and CSW called admissions coordinator, Wille Celeste and she stated that at the moment she has some availability. Janie requested CSW follow up with her tomorrow for anticipated discharge of Wednesday or Thursday. CSW will continue to follow.   Rozetta Nunnery MSW, [employer] [phone]

## 2012-05-12 NOTE — Progress Notes (Signed)
Physical Therapy Treatment Patient Details Name: Trevor [name] MRN: [id number] DOB: December 14, 1924 Today's Date: 05/12/2012 Time: 4098-1191 PT Time Calculation (min): 15 min  PT Assessment / Plan / Recommendation Comments on Treatment Session  Patient s/p afib with RVR, COPD, compression fx, and VDRF.  Progressing with ambulation increasing distance and decr support daily.      Follow Up Recommendations  Skilled nursing facility;Supervision/Assistance - 24 hour    Barriers to Discharge        Equipment Recommendations  Defer to next venue    Recommendations for Other Services    Frequency Min 3X/week   Plan Discharge plan remains appropriate;Frequency remains appropriate    Precautions / Restrictions Precautions Precautions: Fall Restrictions Weight Bearing Restrictions: No   Pertinent Vitals/Pain VSS, No pain    Mobility  Bed Mobility Bed Mobility: Not assessed Transfers Transfers: Sit to Stand;Stand to Sit Sit to Stand: 5: Supervision;With upper extremity assist;From chair/3-in-1;With armrests Stand to Sit: 5: Supervision;To chair/3-in-1;With upper extremity assist;With armrests Stand Pivot Transfers: Not tested (comment) Details for Transfer Assistance: Needed cues for hand placement.  Needed incr time for sit to stand as well.  Ambulation/Gait Ambulation/Gait Assistance: 4: Min guard Ambulation Distance (Feet): 150 Feet Assistive device: Rolling walker Ambulation/Gait Assistance Details: PAtient needed cues for flexed posture to stand tall.  Verbal cues and manual assist to steer RW at all times.  Gait Pattern: Step-to pattern;Decreased stride length;Shuffle;Trunk flexed;Wide base of support Gait velocity: decreased Stairs: No Wheelchair Mobility Wheelchair Mobility: No    PT Goals Acute Rehab PT Goals PT Goal: Sit to Stand - Progress: Progressing toward goal PT Goal: Stand to Sit - Progress: Progressing toward goal PT Goal: Ambulate - Progress: Progressing  toward goal  Visit Information  Last PT Received On: 05/12/12 Assistance Needed: +1    Subjective Data  Subjective: "I have to stay three more days becausee of fluid on my lungs."   Cognition  Overall Cognitive Status: Appears within functional limits for tasks assessed/performed Arousal/Alertness: Awake/alert Orientation Level: Appears intact for tasks assessed Behavior During Session: [facility] for tasks performed    Balance  Static Standing Balance Static Standing - Balance Support: Bilateral upper extremity supported;During functional activity Static Standing - Level of Assistance: 5: Stand by assistance Static Standing - Comment/# of Minutes: 3  End of Session PT - End of Session Equipment Utilized During Treatment: Gait belt Activity Tolerance: Patient tolerated treatment well Patient left: in chair;with call bell/phone within reach Nurse Communication: Mobility status      INGOLD,Nora Sabey 05/12/2012, 3:13 PM  [facility] Acute Rehabilitation [phone] (pager)

## 2012-05-12 NOTE — Progress Notes (Signed)
Pt. Alarmed heart rate down to 26 sustaining for approximately 1 minute.  After waking patient up, heart rate back into 70s-80s.  Pt. Asymptomatic.  BP 114/69.  After looking at heart rhythm strip, the patient has gone into 3rd degree heart block when heart rate dropped.  Pt. Had EKG ordered for this morning which showed a-fib heart rate 80.  Dr. Maryelizabeth Kaufmann on call for [facility] cardiology made aware.  Will continue to monitor patient.

## 2012-05-12 NOTE — Progress Notes (Signed)
Patient Name: Trevor [name] Date of Encounter: 05/12/2012     Principal Problem:  *Lethargy Active Problems:  Ischemic heart disease  Congestive heart failure with left ventricular systolic dysfunction  COPD (chronic obstructive pulmonary disease)  Atrial fibrillation  Respiratory failure with hypercapnia  Acute respiratory failure with hypoxia  Shock  NSTEMI (non-ST elevated myocardial infarction)  Hypotension    SUBJECTIVE: Feels well this AM. States shortness of breath is improved. Denies palpitations, chest pain or lightheadedness.    OBJECTIVE  Filed Vitals:   05/12/12 0401 05/12/12 0500 05/12/12 0503 05/12/12 0840  BP: 114/69     Pulse: 94     Temp: 96.7 F (35.9 C) 98 F (36.7 C)    TempSrc: Axillary Oral    Resp: 16     Height:      Weight:   79.878 kg (176 lb 1.6 oz)   SpO2: 94%   92%    Intake/Output Summary (Last 24 hours) at 05/12/12 6962 Last data filed at 05/12/12 9528  Gross per 24 hour  Intake   1083 ml  Output    901 ml  Net    182 ml   Weight change: -0.222 kg (-7.8 oz)  PHYSICAL EXAM General: Elderly, frail, well developed, in no acute distress. Head: Normocephalic, atraumatic, sclera non-icteric, no xanthomas, nares are without discharge.  Neck: Supple without bruits or JVD to mandibular angle Lungs:  Tachypneic, bibasilar rales noted. Resp unlabored. No wheezes appreciated. RLL rhonchi appreciated.  Heart: Distant heart sounds. Regular/irregular at times. Rate WNL. II/VI systolic murmur LUSB no s3, s4 Abdomen: Distended, + hepatojugular reflux, non-tender, BS + x 4.  Msk:  Strength and tone appears normal for age. Extremities:  2+ pitting pretibial edema bilaterally. No clubbing and cyanosis. DP/PT/Radials 2+ and equal bilaterally. Neuro: Alert and oriented X 3. Moves all extremities spontaneously. Psych: Normal affect.  LABS:  Recent Labs  Basename 05/12/12 0630   WBC 8.2   HGB 10.0*   HCT 30.7*   MCV 79.3   PLT PENDING    Lab 05/12/12 0630 05/11/12 0500 05/10/12 1518 05/06/12 2027  NA 137 141 138 --  K 4.6 3.8 3.0* --  CL 103 102 98 --  CO2 27 33* 34* --  BUN 17 20 20  --  CREATININE 0.56 0.63 0.61 --  CALCIUM 7.8* 8.0* 7.6* --  PROT -- -- -- 6.6  BILITOT -- -- -- 0.7  ALKPHOS -- -- -- 90  ALT -- -- -- 30  AST -- -- -- 47*  AMYLASE -- -- -- --  LIPASE -- -- -- --  GLUCOSE 114* 137* 183* --    TELE: NSR/paroxysmal conversion to atrial fibrillation, evidence of Mobitz type 2 and 3rd degree HB ECG: NSR (P waves low voltage, difficult to appreciate, rhythm regular), RBBB, Q waves II, III, aVF, occasional PVCs, no ST-T wave changes   Radiology/Studies:  Dg Chest 2 View  04/24/2012  *RADIOLOGY REPORT*  Clinical Data: Weakness, shortness of breath, CHF.  CHEST - 2 VIEW  Comparison: 08/10/2011  Findings: Cardiomegaly with vascular congestion.  Low lung volumes with bibasilar atelectasis and small effusions.  No overt edema. No acute bony abnormality.  IMPRESSION: Cardiomegaly, vascular congestion.  Low lung volumes, bibasilar atelectasis.  Small effusions.  Original Report Authenticated By: Cyndie Chime, M.D.   Ct Head Wo Contrast  04/23/2012  *RADIOLOGY REPORT*  Clinical Data: Lethargic.  Questionable head trauma.  Rule out bleed.  CT HEAD WITHOUT CONTRAST  Technique:  Contiguous axial images were obtained from the base of the skull through the vertex without contrast.  Comparison: None.  Findings: Motion degraded exam.  No intracranial hemorrhage.  No CT evidence of large acute infarct.  Small vessel disease type changes.  Global atrophy without hydrocephalus.  Vascular calcifications.  No intracranial mass lesion detected on this unenhanced exam.  Minimal mucosal thickening maxillary sinuses.  IMPRESSION: No intracranial hemorrhage.  Please see above.  Original Report Authenticated By: Fuller Canada, M.D.   Dg Chest Port 1 View  05/07/2012  *RADIOLOGY REPORT*  Clinical Data: Assess endotracheal tube   PORTABLE CHEST - 1 VIEW  Comparison: Portable exam 0519 hours compared to 05/06/2012  Findings: Tip of endotracheal tube 2.3 cm above carina. Nasogastric tube extends into stomach. Enlargement of cardiac silhouette with pulmonary vascular congestion. Bibasilar opacities likely representing atelectasis and accompanying effusions. Mild persistent perihilar edema. Bones demineralized.  IMPRESSION: Mild persistent pulmonary edema with bibasilar atelectasis and pleural effusions.  Original Report Authenticated By: Lollie Marrow, M.D.   Dg Chest Port 1 View  05/06/2012  *RADIOLOGY REPORT*  Clinical Data: Respiratory failure.  Status post retraction of endotracheal tube.  PORTABLE CHEST - 1 VIEW  Comparison: 1510 hours  Findings: Film at 1703 hours demonstrates retraction of the endotracheal tube with the tip now approximately 1 cm above the carina.  Degree of residual pulmonary edema and bilateral pleural effusions is stable.  Stable cardiomegaly.  IMPRESSION: Endotracheal tube tip now approximately 1 cm above the carina. Stable pulmonary edema and pleural effusions.  Original Report Authenticated By: Reola Calkins, M.D.   Dg Chest Port 1 View  05/06/2012  *RADIOLOGY REPORT*  Clinical Data: Post endotracheal tube placement.  PORTABLE CHEST - 1 VIEW  Comparison: 05/06/2012 10:00 a.m.  Findings: Endotracheal tube tip enters the right mainstem bronchus. Recommend retracting by 3.5 cm.  Nasogastric tube courses below the diaphragm.  The tip is not included on this exam.  Rotation to the left.  Cardiomegaly.  Pulmonary vascular congestion/pulmonary edema.  Opacification mid to lower lung zones may represent pleural fluid and atelectasis, infiltrate not excluded.  No gross pneumothorax.  Calcified aorta.  IMPRESSION: Endotracheal tube tip enters the right mainstem bronchus. Recommend retracting by 3.5 cm.  Cardiomegaly.  Pulmonary vascular congestion/pulmonary edema.  Opacification mid to lower lung zones may  represent pleural fluid and atelectasis, infiltrate not excluded.  Critical Value/emergent results were called by telephone at the time of interpretation on 05/06/2012 at 3:28 pm to [facility] patient's nurse., who verbally acknowledged these results.  Original Report Authenticated By: Fuller Canada, M.D.   Dg Chest Port 1 View  05/06/2012  *RADIOLOGY REPORT*  Clinical Data: Low O2 saturation.  Lethargy.  PORTABLE CHEST - 1 VIEW  Comparison: 05/01/2012  Findings: Tip of the central venous catheter is not apparent on this image.  Pulmonary vascularity is now normal.  There is elevation of the left hemidiaphragm.  Right pleural effusion and right base atelectasis persists.  Left base atelectasis persists.  IMPRESSION: Improved pulmonary vascularity.  No other change.  Original Report Authenticated By: Gwynn Burly, M.D.   Dg Chest Port 1 View  05/01/2012  *RADIOLOGY REPORT*  Clinical Data: Congestive heart failure.  PORTABLE CHEST - 1 VIEW  Comparison: Chest x-rays dated 07/10, 07/09, 07/08, and 04/27/2012  Findings: Chronic cardiomegaly.  Increased elevation of the left hemidiaphragm.  The pulmonary vascularity is now normal.  There is a moderate right pleural effusion with atelectasis at the right lung  base, unchanged.  The patient does have a severe  compression fracture in the mid thoracic spine, new since 07/20/2011.  IMPRESSION: Resolving interstitial edema.  Pulmonary vascularity is now normal.  Persistent right effusion and atelectasis.  Compression fracture of the mid thoracic spine is new since 07/20/2011.  Original Report Authenticated By: Gwynn Burly, M.D.   Dg Chest Port 1 View  04/30/2012  *RADIOLOGY REPORT*  Clinical Data: Edema.  Pleural effusions.  PORTABLE CHEST - 1 VIEW  Comparison: Plain film chest 04/29/2012 and 04/28/2012.  Findings: Extensive bilateral airspace disease and pleural effusions appear worsened.  There is cardiomegaly.  Left IJ catheter remains in place.  IMPRESSION:  Worsened bilateral effusions and airspace disease.  Original Report Authenticated By: Bernadene Bell. Maricela Curet, M.D.   Dg Chest Port 1 View  04/29/2012  *RADIOLOGY REPORT*  Clinical Data: Short of breath.  PORTABLE CHEST - 1 VIEW  Comparison: 04/28/2012.  Findings: Low volume chest.  Left IJ central line remains present. The tip is at the confluence of the brachiocephalic veins.  Patient is kyphotic, producing a lordotic projection.  Bilateral left greater than right pleural effusion with basilar atelectasis. Cardiomegaly.  Mediastinal contours unchanged allowing for technique.  Compared to prior, the enteric tube and endotracheal tube has been removed.  IMPRESSION:  1.  Interval removal of endotracheal tube and enteric tube. 2.  Left IJ central line persists. 3.  Unchanged pulmonary aeration with left greater than right basilar atelectasis, airspace disease and effusion.  Original Report Authenticated By: Andreas Newport, M.D.   Dg Chest Port 1 View  04/28/2012  *RADIOLOGY REPORT*  Clinical Data: Bilateral bronchi.  PORTABLE CHEST - 1 VIEW  Comparison: 04/27/2012  Findings: Reverse lordotic positioning noted with endotracheal tube thought to be about 5 mm above the carina.  Consider retraction of 2 cm.  Nasogastric tube noted entering the stomach.  Low lung volumes present.  Stable bibasilar airspace opacities obscure the hemidiaphragms. Underlying cardiomegaly noted.  Left IJ line tip projects over the SVC.  IMPRESSION:  1.  Endotracheal tube tip is just above the carina.  Retraction of 2 cm is recommended. 2.  Cardiomegaly with bibasilar airspace opacities suspicious for pleural effusions and atelectasis.  Original Report Authenticated By: Dellia Cloud, M.D.   Dg Chest Port 1 View  04/27/2012  *RADIOLOGY REPORT*  Clinical Data: Evaluate endotracheal tube positioning  PORTABLE CHEST - 1 VIEW  Comparison: 04/26/2012; 04/25/2012; 04/24/2012  Findings: Grossly unchanged enlarged cardiac silhouette and  mediastinal contours.  Stable positioning of support apparatus. Lung volumes remain persistently reduced with small bilateral effusions.  Pulmonary vasculature is indistinct with cephalization of flow.  No definite pneumothorax.  Unchanged bones.  IMPRESSION: 1.  Stable positioning of support apparatus.  No pneumothorax. 2.  Persistently reduced lung volumes with stable findings of pulmonary edema, bilateral effusions and bibasilar opacities, atelectasis versus infiltrate.  Original Report Authenticated By: Waynard Reeds, M.D.   Dg Chest Port 1 View  04/26/2012  *RADIOLOGY REPORT*  Clinical Data: Evaluate fluid status  PORTABLE CHEST - 1 VIEW  Comparison: 04/25/2012  Findings: There is an ET tube with tip above the carina.  Left IJ catheter is identified with tip in the SVC.  Cardiac enlargement is stable.  There are low lung volumes, bilateral pleural effusions and pulmonary edema.  Compared with previous exam the interstitial edema has improved.  Similar appearance of the left rib fractures.  IMPRESSION:  1.  Slight improvement in interstitial edema pattern.  Original  Report Authenticated By: Rosealee Albee, M.D.   Dg Chest Port 1 View  04/25/2012  *RADIOLOGY REPORT*  Clinical Data: Intubation and central venous catheter placement.  PORTABLE CHEST - 1 VIEW 04/25/2012 2148 hours:  Comparison: Portable chest x-ray earlier same date 2039 hours.  Findings: Endotracheal tube tip still somewhat low, approximately 2 cm above the carina.  New left jugular central venous catheter tip in the SVC.  No evidence of pneumothorax or mediastinal hematoma. Cardiac silhouette markedly enlarged but stable.  Interstitial and airspace pulmonary edema and bilateral pleural effusions, unchanged.  Associated passive atelectasis in the lower lobes, left greater than right, unchanged.  No new pulmonary parenchymal abnormality.  IMPRESSION:  1.  Left jugular central venous catheter tip in the SVC.  No acute complicating features. 2.   Endotracheal tube tip still low, approximately 2 cm above the carina.  This should be withdrawn at least another 2-3 cm. 3.  Stable CHF and bilateral pleural effusions with associated passive atelectasis in the lower lobes.  Original Report Authenticated By: Arnell Sieving, M.D.   Dg Chest Port 1 View  04/25/2012  *RADIOLOGY REPORT*  Clinical Data: Respiratory arrest  PORTABLE CHEST - 1 VIEW  Comparison: Chest radiograph 04/24/2012  Findings: Patient rotated rightward.  The endotracheal tube appears low measuring 11 mm from carina.  Stable enlarged cardiac silhouette.  There is bilateral perihilar air space disease which increased from prior.  Persistent left basilar atelectasis.  There is small bilateral pleural effusions.  IMPRESSION:  1.  Endotracheal tube appears low although somewhat difficult to define the carina on this rotated exam.  Consider retraction by 3 to 4 cm. 2.  Perihilar air space disease is increased. 3.  Bilateral pleural effusions basilar atelectasis.  Findings discussed with respiratory therapist Pat on 04/25/2012 and 20 45  hours  Original Report Authenticated By: Genevive Bi, M.D.   Dg Chest Port 1v Same Day  05/08/2012  *RADIOLOGY REPORT*  Clinical Data: COPD.  Respiratory failure.  CHEST - 1 VIEW  Comparison: 05/07/2012  Findings: Exam is technically suboptimal due to low lung volumes and patient habitus.  Increased gastric distention is seen. Further decrease in lung volumes is seen with increased bibasilar atelectasis.  Cardiomegaly stable.  Central pulmonary vascular congestion or mild interstitial edema again noted.  Position of patient and underpenetration make evaluation of the mediastinum difficult, but no definite endotracheal tube or nasogastric tube seen on this exam.  IMPRESSION:  1.  Further decreased lung volumes with increased bibasilar atelectasis. 2.  Increased gastric distention. 3.  No significant change in cardiomegaly and central pulmonary vascular  congestion versus mild interstitial edema.  Original Report Authenticated By: Danae Orleans, M.D.    Current Medications:     . a stronger pump book   Does not apply Once  . aspirin EC  81 mg Oral Daily  . atorvastatin  20 mg Oral q1800  . calcium carbonate  1 tablet Oral BID  . clopidogrel  75 mg Oral Q breakfast  . docusate sodium  100 mg Oral BID  . feeding supplement  1 Container Oral BID BM  . ferrous sulfate  325 mg Oral Q breakfast  . furosemide  20 mg Oral Daily  . hydrocortisone sodium succinate  12.5 mg Intravenous Q12H  . losartan  12.5 mg Oral Daily  . multivitamin with minerals  1 tablet Oral Daily  . pantoprazole  40 mg Oral QHS  . phosphorus  250 mg Oral TID  .  sodium chloride  3 mL Intravenous Q12H  . tiotropium  18 mcg Inhalation Daily    ASSESSMENT AND PLAN:  1. New onset atrial fibrillation- patient has been converting from NSR, atrial fibrillation and evidence of Mobitz type 2 and 3rd degree heart block. Patient rates have been acceptable, if not slow overnight. He is not on rate-control agents. Patient has been deemed a poor Coumadin candidate on admission. On exam, he appears frail, he is deconditioned and a fall risk. Would hold off on Coumadin. He remains on ASA/Plavix.   2. Mobitz type 2/3rd degree heart block- evidence earlier this AM. Patient's HR dropped to 20s transiently while the patient was asleep. On awakening, HR increased to 70-80s. Bradycardic event was reviewed and consistent with 3rd degree heart block. There does appear to be evidence of Mobtiz type 2 (P waves very low voltage) second-degree heart block. Will continue to monitor today. If persists, will likely need EP evaluation. Do not think he would be a candidate for an ICD (primary prevention as well given low EF) this admission. Possibly in the future if functional status returns to prior baseline. Hold off on BB or rate-control agents.   2. Acute on chronic systolic CHF- patient is quite  volume overloaded. He is up 20 lbs from admission weight. I/Os trending toward the positive. 2+ pitting pretibial edema, JVD apparent, tachypneic, bibasilar rales noted. He is on a low-dose of Lasix given his reduced EF on echo (25-30%). This is likely ischemia-mediated given evidence of inferior and inferolateral thinning/akinesis on echo. As above, not sure patient would be a good candidate for ICD. He does need further diuresis. Discussed with Dr. Clifton James, will give Lasix 40mg  IV today, monitor I/O, daily weights, adhere to heart healthy diet and he may benefit from compression stockings as well. Favor LE elevation and further diuresis initially. Review BMET tomorrow. K level looks good today, and patient is on Cozaar. Will reassess need for supplementation tomorrow.    3. NSTEMI/CAD- stable. Did have mildly elevated cardiac biomarkers earlier this admission believed to be secondary to demand ischemia during code blue/hypotension/cardiogenic shock. Will continue ASA/Plavix/ARB/statin/NTG SL PRN.  4. Deconditioning- PT/OT recommend SNF placement at discharge. This would be appropriate given patient's level of deconditioning.    5. Acute respiratory failure- earlier this admission attributing to patient's lethargy (hypercarbic encephalopathy) requiring intubation. This was weaned and he was successfully extubated.   7. COPD- baseline pulmonary dysfunction. Continue will switch Combivent to albuterol PRN as patient is on scheduled Spiriva.   6. Cardiogenic shock/hypotension- evidence of prior inferior infarct, inferolateral akinesis on echo, narcotics contributing. He required pressors shortly after code blue earlier this admission. Adrenal insufficiency suspected, steroids tapering. BP has improved. Normotensive today.   8. VT- sustained with HD collapse s/p ROSC with CPR. Patient requiring intubation and pressors thereafter, which were successfully weaned. NSVT on telemetry, no further episodes of  sustained VT.   9. Altered mental status- in the setting of narcotics and amitryptilline. Lucid today and able to hold a conversation on my interview. Hold off on potentially sedating meds given patient's advanced age and evidence of AMS with these this admission.   10. Disposition- patient's admission has been complicated by poor pulmonary function/respiratory status, code blue, AMS and slow diuresis. Likely will be in the hospital for at least a few more days for further diuresis +/- EP consult. Will need to keep an eye on rhythm and heart failure issues at this point. Eventually, discharge to SNF would  be the best option for this gentleman given his lengthy admission, evidence of deconditioning and fall risk.   Signed, R. Hurman Horn, PA-C 05/12/2012, 9:24 AM  I have personally seen and examined this patient with Hurman Horn, PA-C.  I agree with the assessment and plan as outlined above. Will diurese today for volume overload. Will continue PT/OT. He is not ready for d/c. Probably several more days hospitalization. Monitor on telemetry. If further evidence of high grade AV block, will have to consider PPM in this elderly male.   Marsa Matteo 10:55 AM 05/12/2012

## 2012-05-13 LAB — BASIC METABOLIC PANEL
BUN: 18 mg/dL (ref 6–23)
CO2: 35 mEq/L — ABNORMAL HIGH (ref 19–32)
Chloride: 103 mEq/L (ref 96–112)
GFR calc non Af Amer: 90 mL/min — ABNORMAL LOW (ref >90)
Glucose, Bld: 106 mg/dL — ABNORMAL HIGH (ref 70–99)
Potassium: 3.4 mEq/L — ABNORMAL LOW (ref 3.5–5.1)
Sodium: 141 mEq/L (ref 135–145)

## 2012-05-13 MED ORDER — FUROSEMIDE 10 MG/ML IJ SOLN
40.0000 mg | Freq: Two times a day (BID) | INTRAMUSCULAR | Status: DC
  Administered 2012-05-13 – 2012-05-19 (×13): 40 mg via INTRAVENOUS
  Filled 2012-05-13 (×15): qty 4

## 2012-05-13 MED ORDER — POTASSIUM CHLORIDE CRYS ER 20 MEQ PO TBCR
60.0000 meq | EXTENDED_RELEASE_TABLET | Freq: Once | ORAL | Status: AC
  Administered 2012-05-13: 60 meq via ORAL
  Filled 2012-05-13: qty 3

## 2012-05-13 NOTE — Progress Notes (Signed)
Occupational Therapy Treatment Patient Details Name: Trevor [name] MRN: [id number] DOB: 19-Oct-1925 Today's Date: 05/13/2012 Time: 4098-1191 OT Time Calculation (min): 17 min  OT Assessment / Plan / Recommendation Comments on Treatment Session Pt. did well and is progressing towards goals.     Follow Up Recommendations  Skilled nursing facility    Barriers to Discharge       Equipment Recommendations  Defer to next venue    Recommendations for Other Services    Frequency Min 2X/week   Plan Discharge plan remains appropriate    Precautions / Restrictions Precautions Precautions: Fall Restrictions Weight Bearing Restrictions: No   Pertinent Vitals/Pain When lying in bed, pt. C/o tightness in chest, but subsided. He demonstrated DOE.    ADL  Toilet Transfer: Performed;Minimal assistance Toilet Transfer Method: Sit to Barista: Bedside commode Toileting - Clothing Manipulation and Hygiene: Maximal assistance Where Assessed - Engineer, mining and Hygiene: Standing Equipment Used: Gait belt ADL Comments: Pt. was sitting on bedside commode when OT entered room. Pt. performed toilet transfer with Min assist to maintain balance. He attempted to perform hygiene, but had difficulty reaching behind. OT performed hygiene to clean more thoroughly.       OT Goals Acute Rehab OT Goals OT Goal Formulation: With patient Time For Goal Achievement: 05/22/12 Potential to Achieve Goals: Fair ADL Goals Pt Will Perform Grooming: with set-up;Sitting at sink Pt Will Perform Upper Body Bathing: with set-up;Sitting at sink Pt Will Perform Lower Body Bathing: with min assist;Sit to stand from chair Pt Will Perform Upper Body Dressing: with set-up;Sitting, bed;Sitting, chair Pt Will Perform Lower Body Dressing: with min assist;Sit to stand from bed;Sit to stand from chair Pt Will Transfer to Toilet: with min assist;Ambulation ADL Goal: Toilet Transfer -  Progress: Met Pt Will Perform Toileting - Clothing Manipulation: Sitting on 3-in-1 or toilet;with supervision Pt Will Perform Toileting - Hygiene: Sit to stand from 3-in-1/toilet;with min assist ADL Goal: Toileting - Hygiene - Progress: Progressing toward goals  Visit Information  Last OT Received On: 05/13/12 Assistance Needed: +1    Subjective Data   Pt. Pleasant and thankful for assistance.    Prior Functioning       Cognition  Overall Cognitive Status: Appears within functional limits for tasks assessed/performed Arousal/Alertness: Awake/alert Orientation Level: Appears intact for tasks assessed Behavior During Session: [facility] for tasks performed    Mobility Bed Mobility Bed Mobility: Sit to Supine;Scooting to HOB Sit to Supine: 3: Mod assist Scooting to HOB: 1: +1 Total assist Details for Bed Mobility Assistance: assist to lift legs onto bed when going from sit to supine. Transfers Transfers: Sit to Stand;Stand to Sit Sit to Stand: With upper extremity assist;From chair/3-in-1;4: Min assist Stand to Sit: 4: Min guard;With upper extremity assist;To bed           End of Session OT - End of Session Activity Tolerance: Patient tolerated treatment well Patient left: in bed;with call bell/phone within reach  GO     Jenell Milliner 05/13/2012, 2:28 PM

## 2012-05-13 NOTE — Progress Notes (Signed)
SUBJECTIVE: Still having SOB. NO chest pain. NSVT overnight. Also bradycardia overnight.   BP 118/53  Pulse 69  Temp 97.6 F (36.4 C) (Oral)  Resp 17  Ht 5\' 1"  (1.549 m)  Wt 176 lb 2.4 oz (79.9 kg)  BMI 33.28 kg/m2  SpO2 95%  Intake/Output Summary (Last 24 hours) at 05/13/12 0801 Last data filed at 05/12/12 2100  Gross per 24 hour  Intake    843 ml  Output    952 ml  Net   -109 ml    PHYSICAL EXAM General: Well developed, well nourished, in no acute distress. Alert and oriented x 3.  Psych:  Good affect, responds appropriately Neck: 10cm JVD. No masses noted.  Lungs: Crackles bilateral bases.   Heart: RRR  Abdomen: Bowel sounds are present. Soft, non-tender.  Extremities: 2+ bilateral lower ext edema.   LABS: Basic Metabolic Panel:  Basename 05/13/12 0517 05/12/12 0630  NA 141 137  K 3.4* 4.6  CL 103 103  CO2 35* 27  GLUCOSE 106* 114*  BUN 18 17  CREATININE 0.60 0.56  CALCIUM 7.9* 7.8*  MG -- --  PHOS -- --   CBC:  Basename 05/12/12 0630  WBC 8.2  NEUTROABS --  HGB 10.0*  HCT 30.7*  MCV 79.3  PLT 107*    Current Meds:    . a stronger pump book   Does not apply Once  . aspirin EC  81 mg Oral Daily  . atorvastatin  20 mg Oral q1800  . calcium carbonate  1 tablet Oral BID  . clopidogrel  75 mg Oral Q breakfast  . docusate sodium  100 mg Oral BID  . feeding supplement  1 Container Oral BID BM  . ferrous sulfate  325 mg Oral Q breakfast  . furosemide  40 mg Intravenous Daily  . hydrocortisone sodium succinate  12.5 mg Intravenous Q12H  . losartan  12.5 mg Oral Daily  . multivitamin with minerals  1 tablet Oral Daily  . pantoprazole  40 mg Oral QHS  . sodium chloride  3 mL Intravenous Q12H  . tiotropium  18 mcg Inhalation Daily  . DISCONTD: albuterol  2 puff Inhalation Q4H  . DISCONTD: furosemide  20 mg Oral Daily     ASSESSMENT AND PLAN:  1.Atrial fibrillation- Paroxysms of atrial fibrillation and evidence of Mobitz type 2 and 3rd  degree heart block at night. Rate controlled when in atrial fibrillation. Patient has been deemed a poor Coumadin candidate on admission. On exam, he appears frail, he is deconditioned and a fall risk. Would hold off on Coumadin for now. He remains on ASA/Plavix.   2. Mobitz type 2/3rd degree heart block- He has had bradycardia the last two nights while sleeping with evidence of high grade HB.  On awakening, HR increased to 70-80s. Bradycardic event was reviewed and consistent with 3rd degree heart block. There does appear to be evidence of Mobtiz type 2 (P waves very low voltage) second-degree heart block. Will ask EP to see him in regards to potential PPM placement. Hold off on BB or rate-control agents.   2. Acute on chronic systolic CHF- patient is still volume overloaded. He is up 20 lbs from admission weight. I/Os trending toward the positive. 2+ pitting pretibial edema with  JVD. We restarted IV Lasix yesterday. Will increase to BID today for better diuresis.    3. NSTEMI/CAD- stable. Did have mildly elevated cardiac biomarkers earlier this admission believed to be secondary to  demand ischemia during code blue/hypotension/cardiogenic shock. Will continue ASA/Plavix/ARB/statin/NTG SL PRN.   4. Ischemic Cardiomyopathy: LVEF of 25-30%. ? ICD candidate given his age and overall functional level.   5. Deconditioning- PT/OT recommend SNF placement at discharge. This would be appropriate given patient's level of deconditioning. SW involved.   6. Acute respiratory failure- earlier this admission attributing to patient's lethargy (hypercarbic encephalopathy) requiring intubation. This was weaned and he was successfully extubated. Doing much better.   7. COPD- baseline pulmonary dysfunction. Continue will switch Combivent to albuterol PRN as patient is on scheduled Spiriva.   6. Cardiogenic shock/hypotension- evidence of prior inferior infarct, inferolateral akinesis on echo, narcotics contributing. He  required pressors shortly after code blue earlier this admission. Adrenal insufficiency suspected, steroids tapering. BP has improved. Normotensive today.   8. VT- sustained with HD collapse s/p ROSC with CPR. Patient requiring intubation and pressors thereafter, which were successfully weaned. NSVT on telemetry, no further episodes of sustained VT.   9. Altered mental status- in the setting of narcotics and amitryptilline. Lucid today.  Hold off on potentially sedating meds given patient's advanced age and evidence of AMS with these this admission.   10. Hypokalemia: Will replace potassium this am.   11. Disposition- patient's admission has been complicated by poor pulmonary function/respiratory status, code blue, AMS and slow diuresis. Likely will be in the hospital for at least a few more days for further diuresis and  EP consult. Will need to keep an eye on rhythm and heart failure issues at this point. Eventually, discharge to SNF would be the best option for this gentleman given his lengthy admission, evidence of deconditioning and fall risk. May also consider palliative care.     Selicia Windom  7/23/20138:01 AM

## 2012-05-13 NOTE — Progress Notes (Signed)
Patient had 10 beat run of V-tach. Patient up talking with nurse. Patient asymptomatic and states that he feels fine. BP: 118/53, HR: 69. Also patient Heart rate has been dropping into the 30's throughout the night while sleeping. After waking patient, heart rate goes back into the 60's-70's.  Brion Aliment, NP for [facility] cardiology notified. No new orders given. Will continue to monitor.

## 2012-05-13 NOTE — Progress Notes (Signed)
Patient's IV is out of date, night shift nurse was unable to find access; assessed patient but was also unable to find access._______________________________________________________________________D. Manson Passey RN

## 2012-05-13 NOTE — Progress Notes (Signed)
I agree with the following treatment note after reviewing documentation.   Johnston, Sabrie Moritz Brynn   OTR/L Pager: [phone] Office: [phone] .   

## 2012-05-13 NOTE — Progress Notes (Signed)
Physical Therapy Treatment Patient Details Name: Trevor [name] MRN: [id number] DOB: [birth year] Today's Date: 05/13/2012 Time: 4098-1191 PT Time Calculation (min): 12 min  PT Assessment / Plan / Recommendation Comments on Treatment Session  Patient s/p afib with RVR, COPD, compression fx, and VDRF.  Progressing with ambulation.    Follow Up Recommendations  Skilled nursing facility;Supervision/Assistance - 24 hour    Barriers to Discharge        Equipment Recommendations  Defer to next venue    Recommendations for Other Services    Frequency Min 3X/week   Plan Discharge plan remains appropriate;Frequency remains appropriate    Precautions / Restrictions Precautions Precautions: Fall Restrictions Weight Bearing Restrictions: No   Pertinent Vitals/Pain VSS, No pain    Mobility  Bed Mobility Bed Mobility: Not assessed Transfers Transfers: Sit to Stand;Stand to Sit Sit to Stand: 5: Supervision;With upper extremity assist;From chair/3-in-1 Stand to Sit: 5: Supervision;With upper extremity assist;To chair/3-in-1;With armrests Stand Pivot Transfers: Not tested (comment) Details for Transfer Assistance: Continues to need occasional cues for hand placement.  Patient rocks and uses momentum to get up as well.  Needs incr time to shift weight anteriorly to obtain balance.   Ambulation/Gait Ambulation/Gait Assistance: 4: Min guard Ambulation Distance (Feet): 125 Feet Assistive device: Rolling walker Ambulation/Gait Assistance Details: PAtient continues to need cues for postural control and upright stance.  Verbal cues and manual assist to steer RW at times. Gait Pattern: Step-to pattern;Decreased stride length;Trunk flexed;Shuffle;Wide base of support Gait velocity: decreased Stairs: No Wheelchair Mobility Wheelchair Mobility: No    PT Goals Acute Rehab PT Goals PT Goal: Sit to Stand - Progress: Progressing toward goal PT Goal: Stand to Sit - Progress: Progressing toward  goal PT Goal: Ambulate - Progress: Progressing toward goal  Visit Information  Last PT Received On: 05/13/12 Assistance Needed: +1    Subjective Data  Subjective: "I have a feeling I may wet myself because of all this fluid."   Cognition  Overall Cognitive Status: Appears within functional limits for tasks assessed/performed Arousal/Alertness: Awake/alert Orientation Level: Appears intact for tasks assessed Behavior During Session: [facility] for tasks performed    Balance  Static Standing Balance Static Standing - Balance Support: Bilateral upper extremity supported;During functional activity Static Standing - Level of Assistance: 5: Stand by assistance Static Standing - Comment/# of Minutes: 2 minutes while putting on housecoat - needed min a to don housecoat.  End of Session PT - End of Session Equipment Utilized During Treatment: Gait belt Activity Tolerance: Patient tolerated treatment well Patient left: in chair;with call bell/phone within reach Nurse Communication: Mobility status       INGOLD,Mikaeel Petrow 05/13/2012, 10:19 AM  [name] [phone] (pager)

## 2012-05-14 DIAGNOSIS — I259 Chronic ischemic heart disease, unspecified: Secondary | ICD-10-CM

## 2012-05-14 LAB — BASIC METABOLIC PANEL
BUN: 19 mg/dL (ref 6–23)
CO2: 33 mEq/L — ABNORMAL HIGH (ref 19–32)
Chloride: 100 mEq/L (ref 96–112)
Creatinine, Ser: 0.76 mg/dL (ref 0.50–1.35)
GFR calc Af Amer: >90 mL/min (ref >90)
Glucose, Bld: 124 mg/dL — ABNORMAL HIGH (ref 70–99)
Potassium: 3.9 mEq/L (ref 3.5–5.1)

## 2012-05-14 LAB — CBC
HCT: 31.4 % — ABNORMAL LOW (ref 39.0–52.0)
Hemoglobin: 10.4 g/dL — ABNORMAL LOW (ref 13.0–17.0)
MCH: 26.2 pg (ref 26.0–34.0)
MCHC: 33.1 g/dL (ref 30.0–36.0)
MCV: 79.1 fL (ref 78.0–100.0)
RDW: 19.1 % — ABNORMAL HIGH (ref 11.5–15.5)

## 2012-05-14 MED FILL — Docusate Sodium Cap 100 MG: ORAL | Qty: 1 | Status: AC

## 2012-05-14 MED FILL — Pantoprazole Sodium EC Tab 40 MG (Base Equiv): ORAL | Qty: 1 | Status: AC

## 2012-05-14 MED FILL — Sodium Chloride Flush IV Soln 0.9%: INTRAVENOUS | Qty: 3 | Status: AC

## 2012-05-14 MED FILL — Calcium Carbonate Tab 1250 MG (500 MG Elemental Ca): ORAL | Qty: 1 | Status: AC

## 2012-05-14 MED FILL — Atorvastatin Calcium Tab 20 MG (Base Equivalent): ORAL | Qty: 1 | Status: AC

## 2012-05-14 MED FILL — Hydrocortisone Sodium Succinate PF For Inj 100 MG: INTRAMUSCULAR | Qty: 1 | Status: AC

## 2012-05-14 NOTE — Progress Notes (Signed)
Patient Name: Trevor [name] Date of Encounter: 05/14/2012  Principal Problem:  *Lethargy Active Problems:  Atrial fibrillation  Ischemic heart disease  Congestive heart failure with left ventricular systolic dysfunction  COPD (chronic obstructive pulmonary disease)  Respiratory failure with hypercapnia  Acute respiratory failure with hypoxia  Shock  NSTEMI (non-ST elevated myocardial infarction)  Hypotension    SUBJECTIVE: No chest pain, very weak, still with DOE.  He is slowly improving.  OBJECTIVE Filed Vitals:   05/14/12 0532 05/14/12 0734 05/14/12 0824 05/14/12 1016  BP: 124/64 123/68  119/71  Pulse: 78 78 67 73  Temp: 97.7 F (36.5 C) 98.7 F (37.1 C)  98.6 F (37 C)  TempSrc: Oral Oral  Oral  Resp: 18 20 18 18   Height:      Weight: 171 lb 4.8 oz (77.7 kg)     SpO2: 94% 97% 97% 92%    Intake/Output Summary (Last 24 hours) at 05/14/12 1119 Last data filed at 05/14/12 1027  Gross per 24 hour  Intake   1317 ml  Output   3228 ml  Net  -1911 ml   Weight change: -4 lb 13.6 oz (-2.2 kg) Filed Weights   05/12/12 0503 05/13/12 0502 05/14/12 0532  Weight: 176 lb 1.6 oz (79.878 kg) 176 lb 2.4 oz (79.9 kg) 171 lb 4.8 oz (77.7 kg)   Tele- predominantly sinus rhythm, nocturnal 2:1 AV block is observed  PHYSICAL EXAM  Physical Exam: Filed Vitals:   05/14/12 0532 05/14/12 0734 05/14/12 0824 05/14/12 1016  BP: 124/64 123/68  119/71  Pulse: 78 78 67 73  Temp: 97.7 F (36.5 C) 98.7 F (37.1 C)  98.6 F (37 C)  TempSrc: Oral Oral  Oral  Resp: 18 20 18 18   Height:      Weight: 171 lb 4.8 oz (77.7 kg)     SpO2: 94% 97% 97% 92%    GEN- The patient is chronically ill appearing, alert and oriented x 3 today.   Head- normocephalic, atraumatic Eyes-  Sclera clear, conjunctiva pink Ears- hearing intact Oropharynx- clear Neck- supple,   Lungs- Clear to ausculation bilaterally, normal work of breathing Heart- Regular rate and rhythm  GI- soft, NT, ND, +  BS Extremities- no clubbing, cyanosis, 1+ edema MS- diffuse muscle atrophy Skin- no rash or lesion Psych- euthymic mood, full affect Neuro- strength and sensation are intact  LABS: CBC: Basename 05/14/12 0550 05/12/12 0630  WBC 8.4 8.2  NEUTROABS -- --  HGB 10.4* 10.0*  HCT 31.4* 30.7*  MCV 79.1 79.3  PLT 117* 107*   Basic Metabolic Panel: Basename 05/14/12 0550 05/13/12 0517  NA 139 141  K 3.9 3.4*  CL 100 103  CO2 33* 35*  GLUCOSE 124* 106*  BUN 19 18  CREATININE 0.76 0.60  CALCIUM 8.2* 7.9*  MG -- --  PHOS -- --   Cardiac Enzymes:  Lab Results  Component Value Date   CKTOTAL 31 05/08/2012   CKMB 4.7* 05/08/2012   TROPONINI <0.30 05/08/2012    BNP: Pro B Natriuretic peptide (BNP)  Date/Time Value Range Status  05/14/2012  5:50 AM 2190.0* 0 - 450 pg/mL Final  04/25/2012 10:48 PM 14991.0* 0 - 450 pg/mL Final    Radiology/Studies: Dg Chest Port 1 View 05/07/2012  *RADIOLOGY REPORT*  Clinical Data: Assess endotracheal tube  PORTABLE CHEST - 1 VIEW  Comparison: Portable exam 0519 hours compared to 05/06/2012  Findings: Tip of endotracheal tube 2.3 cm above carina. Nasogastric tube extends into stomach.  Enlargement of cardiac silhouette with pulmonary vascular congestion. Bibasilar opacities likely representing atelectasis and accompanying effusions. Mild persistent perihilar edema. Bones demineralized.  IMPRESSION: Mild persistent pulmonary edema with bibasilar atelectasis and pleural effusions.  Original Report Authenticated By: Lollie Marrow, M.D.    Current Medications:     . a stronger pump book   Does not apply Once  . aspirin EC  81 mg Oral Daily  . atorvastatin  20 mg Oral q1800  . calcium carbonate  1 tablet Oral BID  . clopidogrel  75 mg Oral Q breakfast  . docusate sodium  100 mg Oral BID  . feeding supplement  1 Container Oral BID BM  . ferrous sulfate  325 mg Oral Q breakfast  . furosemide  40 mg Intravenous BID  . hydrocortisone sodium succinate  12.5  mg Intravenous Q12H  . losartan  12.5 mg Oral Daily  . multivitamin with minerals  1 tablet Oral Daily  . pantoprazole  40 mg Oral QHS  . sodium chloride  3 mL Intravenous Q12H  . tiotropium  18 mcg Inhalation Daily      . DOPamine Stopped (05/08/12 0805)    ASSESSMENT AND PLAN:  1.Atrial fibrillation- Paroxysms of atrial fibrillation.  Patient has been deemed a poor Coumadin candidate on admission. On exam, he appears frail, he is deconditioned and a fall risk. Would hold off on Coumadin for now. He remains on ASA/Plavix.   2. Nocturnal bradycardia- He has had bradycardia during the night while sleeping with evidence of 2:1 AV block.  On awakening, HR increased to 70-80s.  He has had no symptoms of bradycardia.  I have spoken at length with the patient and also his daughter/ spouse (by phone).  As he has no symptoms of AV block and all episodes are nocturnal, I would not recommend a PPM at this point.  The patient and his family would like to avoid procedures also.  2. Acute on chronic systolic CHF- patient is still volume overloaded.  Continue diursis  3. NSTEMI/CAD- stable. Did have mildly elevated cardiac biomarkers earlier this admission believed to be secondary to demand ischemia during code blue/hypotension/cardiogenic shock. Will continue ASA/Plavix/ARB/statin/NTG SL PRN.   4. Ischemic Cardiomyopathy:  Continue medical therapy long term,  He is not a candidate for an ICD given his advanced age and comorbidities.  5. Deconditioning- PT/OT recommend SNF placement at discharge. This would be appropriate given patient's level of deconditioning. SW involved.   6. Acute respiratory failure- earlier this admission attributing to patient's lethargy (hypercarbic encephalopathy) requiring intubation. This was weaned and he was successfully extubated. Doing much better.   7. COPD- baseline pulmonary dysfunction. Continue will switch Combivent to albuterol PRN as patient is on scheduled  Spiriva.   8. Cardiogenic shock/hypotension- evidence of prior inferior infarct, inferolateral akinesis on echo, narcotics contributing. He required pressors shortly after code blue earlier this admission. Adrenal insufficiency suspected, steroids tapering. BP has improved. Normotensive today.   9. Altered mental status- resolved, avoid narcotics/ sedatives  10. Disposition- patient's admission has been complicated by poor pulmonary function/respiratory status, code blue, AMS and slow diuresis. Likely will be in the hospital for at least a few more days. Eventually, discharge to SNF would be the best option for this gentleman given his lengthy admission, evidence of deconditioning and fall risk. May also consider palliative care.  I would anticipate SNF likely on Monday.  Fayrene Fearing Raylynn Hersh,MD

## 2012-05-15 LAB — BASIC METABOLIC PANEL
BUN: 17 mg/dL (ref 6–23)
CO2: 32 mEq/L (ref 19–32)
Calcium: 8.5 mg/dL (ref 8.4–10.5)
GFR calc non Af Amer: >90 mL/min (ref >90)
Glucose, Bld: 105 mg/dL — ABNORMAL HIGH (ref 70–99)
Sodium: 136 mEq/L (ref 135–145)

## 2012-05-15 MED ORDER — POTASSIUM CHLORIDE CRYS ER 20 MEQ PO TBCR
40.0000 meq | EXTENDED_RELEASE_TABLET | Freq: Once | ORAL | Status: AC
  Administered 2012-05-15: 40 meq via ORAL
  Filled 2012-05-15: qty 2

## 2012-05-15 NOTE — Progress Notes (Signed)
Physical Therapy Treatment Patient Details Name: Trevor [name] MRN: [id number] DOB: [birth year] Today's Date: 05/15/2012 Time: 7829-5621 PT Time Calculation (min): 22 min  PT Assessment / Plan / Recommendation Comments on Treatment Session  Focus of session was increasing activity tolerance & strengthening.  Pt very motivated.     Follow Up Recommendations  Skilled nursing facility;Supervision/Assistance - 24 hour    Barriers to Discharge        Equipment Recommendations  Defer to next venue    Recommendations for Other Services    Frequency Min 3X/week   Plan Discharge plan remains appropriate;Frequency remains appropriate    Precautions / Restrictions Precautions Precautions: Fall Restrictions Weight Bearing Restrictions: No       Mobility  Bed Mobility Bed Mobility: Not assessed Transfers Transfers: Sit to Stand;Stand to Sit Sit to Stand: 4: Min guard;With armrests;From chair/3-in-1;With upper extremity assist Stand to Sit: 5: Supervision;With upper extremity assist;With armrests;To chair/3-in-1 Details for Transfer Assistance: Performed 7x's for strengthening, activity tolerance, & safe technique.   Ambulation/Gait Ambulation/Gait Assistance: 5: Supervision Ambulation Distance (Feet): 120 Feet Assistive device: Rolling walker Ambulation/Gait Assistance Details: Cues for tall posture & to stay inside RW.  DOE noted but 02 sats remained WNL's.  Gait Pattern: Step-through pattern;Decreased stride length;Trunk flexed;Wide base of support (decreased step height) Stairs: No Wheelchair Mobility Wheelchair Mobility: No    Exercises General Exercises - Lower Extremity Ankle Circles/Pumps: AROM;20 reps;Both Long Arc Quad: AROM;Both;15 reps Heel Slides: AROM;Both;15 reps Hip ABduction/ADduction: AROM;Both;15 reps Straight Leg Raises: AROM;Both;15 reps Hip Flexion/Marching: AROM;Both;15 reps     PT Goals Acute Rehab PT Goals Time For Goal Achievement:  05/10/12 Potential to Achieve Goals: Good PT Goal: Sit to Stand - Progress: Progressing toward goal PT Goal: Stand to Sit - Progress: Met PT Goal: Ambulate - Progress: Met  Visit Information  Last PT Received On: 05/15/12 Assistance Needed: +1    Subjective Data      Cognition  Overall Cognitive Status: Appears within functional limits for tasks assessed/performed Arousal/Alertness: Awake/alert Orientation Level: Appears intact for tasks assessed Behavior During Session: [facility] for tasks performed    Balance  Balance Balance Assessed: No  End of Session PT - End of Session Equipment Utilized During Treatment: Gait belt Activity Tolerance: Patient tolerated treatment well Patient left: in chair;with call bell/phone within reach   [place], [place] [phone] 05/15/2012

## 2012-05-15 NOTE — Progress Notes (Signed)
I cosign for Karen Wall's assessment, med administration, notes, I/O, and care plan education. 

## 2012-05-15 NOTE — Progress Notes (Signed)
SUBJECTIVE: Breathing better. No pain.   BP 131/79  Pulse 88  Temp 98 F (36.7 C) (Oral)  Resp 20  Ht 5\' 1"  (1.549 m)  Wt 165 lb 12.8 oz (75.206 kg)  BMI 31.33 kg/m2  SpO2 91%  Intake/Output Summary (Last 24 hours) at 05/15/12 0905 Last data filed at 05/15/12 0859  Gross per 24 hour  Intake   1660 ml  Output   4027 ml  Net  -2367 ml    PHYSICAL EXAM General: Well developed, well nourished, in no acute distress. Alert and oriented x 3.  Psych: Good affect, responds appropriately  Neck: 8cm JVD. No masses noted.  Lungs: Crackles bilateral bases.  Heart: RRR  Abdomen: Bowel sounds are present. Soft, non-tender.  Extremities: 1-2+ bilateral lower ext edema.    LABS: Basic Metabolic Panel:  Basename 05/15/12 0611 05/14/12 0550  NA 136 139  K 3.7 3.9  CL 96 100  CO2 32 33*  GLUCOSE 105* 124*  BUN 17 19  CREATININE 0.56 0.76  CALCIUM 8.5 8.2*  MG -- --  PHOS -- --   CBC:  Basename 05/14/12 0550  WBC 8.4  NEUTROABS --  HGB 10.4*  HCT 31.4*  MCV 79.1  PLT 117*   Current Meds:    . a stronger pump book   Does not apply Once  . aspirin EC  81 mg Oral Daily  . atorvastatin  20 mg Oral q1800  . calcium carbonate  1 tablet Oral BID  . clopidogrel  75 mg Oral Q breakfast  . docusate sodium  100 mg Oral BID  . feeding supplement  1 Container Oral BID BM  . ferrous sulfate  325 mg Oral Q breakfast  . furosemide  40 mg Intravenous BID  . hydrocortisone sodium succinate  12.5 mg Intravenous Q12H  . losartan  12.5 mg Oral Daily  . multivitamin with minerals  1 tablet Oral Daily  . pantoprazole  40 mg Oral QHS  . sodium chloride  3 mL Intravenous Q12H  . tiotropium  18 mcg Inhalation Daily     ASSESSMENT AND PLAN:  1.Atrial fibrillation- Paroxysms of atrial fibrillation. Rate controlled this am.  Patient has been deemed a poor Coumadin candidate on admission. On exam, he appears frail, he is deconditioned and a fall risk. Would hold off on Coumadin for  now. He remains on ASA/Plavix.   2. Nocturnal bradycardia- He has had bradycardia during the night while sleeping with evidence of 2:1 AV block. On awakening, HR increased to 70-80s. Seen by Dr. Johney Frame with EP yesterday and since he has no symptoms of AV block and all episodes are nocturnal, he would not recommend a PPM at this point. The patient and his family would like to avoid procedures also.   2. Acute on chronic systolic CHF- patient is still volume overloaded but diuresing well. Will continue IV Lasix today.   3. NSTEMI/CAD- stable. Did have mildly elevated cardiac biomarkers earlier this admission believed to be secondary to demand ischemia during code blue/hypotension/cardiogenic shock. Will continue ASA/Plavix/ARB/statin/NTG SL PRN.   4. Ischemic Cardiomyopathy: Continue medical therapy long term, He is not a candidate for an ICD given his advanced age and comorbidities.   5. Deconditioning- PT/OT recommend SNF placement at discharge. This would be appropriate given patient's level of deconditioning. SW involved.   6. Acute respiratory failure- earlier this admission attributing to patient's lethargy (hypercarbic encephalopathy) requiring intubation. This was weaned and he was successfully extubated. Doing  much better.   7. COPD- baseline pulmonary dysfunction.  8. Cardiogenic shock/hypotension- evidence of prior inferior infarct, inferolateral akinesis on echo, narcotics contributing. He required pressors shortly after code blue earlier this admission. Adrenal insufficiency suspected, steroids tapering. BP has improved. BP is normal today.    9. Altered mental status- resolved, avoid narcotics/ sedatives   10. Disposition- patient's admission has been complicated by poor pulmonary function/respiratory status, code blue, AMS and slow diuresis. Planning for d/c to SNF  Monday if he continues to improve.     MCALHANY,CHRISTOPHER  7/25/20139:05 AM

## 2012-05-15 NOTE — Progress Notes (Addendum)
Clinical Social Worker followed up with Wille Celeste at [facility] regarding a possible weekend admission if patient was medically stable. Per Wille Celeste, they can do a Saturday/Sunday admission. CSW will notify MD of weekend option for discharge to SNF, if patient is medically stable. CSW will continue to follow.   9:13am CSW spoke with patient's wife regarding the admission paperwork for [facility] and she stated she will call the facility to schedule a time for completing admission paperwork, if there is a discharge over the weekend.   Rozetta Nunnery MSW, [employer] [phone]

## 2012-05-15 NOTE — Progress Notes (Signed)
Nutrition Follow-up  Intervention:   1. Continue Resource Breeze BID 2. RD will continue to follow    Assessment:   PO intake remains good, >75% mostly. Continues to diurese. Per notes, likely to d/c to SNF on Monday. Pt denies any additional needs at this time. Would like to continue [place].   Diet Order:  Heart Supplements: Resource Breeze BID  Meds: Scheduled Meds:    . a stronger pump book   Does not apply Once  . aspirin EC  81 mg Oral Daily  . atorvastatin  20 mg Oral q1800  . calcium carbonate  1 tablet Oral BID  . clopidogrel  75 mg Oral Q breakfast  . docusate sodium  100 mg Oral BID  . feeding supplement  1 Container Oral BID BM  . ferrous sulfate  325 mg Oral Q breakfast  . furosemide  40 mg Intravenous BID  . hydrocortisone sodium succinate  12.5 mg Intravenous Q12H  . losartan  12.5 mg Oral Daily  . multivitamin with minerals  1 tablet Oral Daily  . pantoprazole  40 mg Oral QHS  . potassium chloride  40 mEq Oral Once  . sodium chloride  3 mL Intravenous Q12H  . tiotropium  18 mcg Inhalation Daily   Continuous Infusions:    . DOPamine Stopped (05/08/12 0805)   PRN Meds:.sodium chloride, acetaminophen (TYLENOL) oral liquid 160 mg/5 mL, albuterol, nitroGLYCERIN, ondansetron (ZOFRAN) IV, polyethylene glycol, sodium chloride  Labs:  CMP     Component Value Date/Time   NA 136 05/15/2012 0611   K 3.7 05/15/2012 0611   CL 96 05/15/2012 0611   CO2 32 05/15/2012 0611   GLUCOSE 105* 05/15/2012 0611   BUN 17 05/15/2012 0611   CREATININE 0.56 05/15/2012 0611   CALCIUM 8.5 05/15/2012 0611   PROT 6.6 05/06/2012 2027   ALBUMIN 2.3* 05/06/2012 2027   AST 47* 05/06/2012 2027   ALT 30 05/06/2012 2027   ALKPHOS 90 05/06/2012 2027   BILITOT 0.7 05/06/2012 2027   GFRNONAA >90 05/15/2012 0611   GFRAA >90 05/15/2012 0611     Intake/Output Summary (Last 24 hours) at 05/15/12 1054 Last data filed at 05/15/12 1040  Gross per 24 hour  Intake   1423 ml  Output   3677 ml  Net  -2254  ml    Weight Status:  165 lbs, trending down   Re-estimated needs:  1600-1800 kcal, 80-90 gm protein   Nutrition Dx:  Inadequate oral intake, resolving   Goal:  PO intake of meals and supplements to meet >90% of estimated nutrition needs, likely being met  Monitor:  PO intake, weight, labs   Clarene Duke RD, LDN Pager [phone] After Hours pager [phone]

## 2012-05-16 ENCOUNTER — Inpatient Hospital Stay (HOSPITAL_COMMUNITY): Payer: Medicare Other

## 2012-05-16 LAB — BASIC METABOLIC PANEL
BUN: 19 mg/dL (ref 6–23)
Creatinine, Ser: 0.63 mg/dL (ref 0.50–1.35)
GFR calc non Af Amer: 88 mL/min — ABNORMAL LOW (ref >90)
Glucose, Bld: 102 mg/dL — ABNORMAL HIGH (ref 70–99)
Potassium: 3.6 mEq/L (ref 3.5–5.1)

## 2012-05-16 MED ORDER — POTASSIUM CHLORIDE CRYS ER 20 MEQ PO TBCR
20.0000 meq | EXTENDED_RELEASE_TABLET | Freq: Every day | ORAL | Status: DC
  Administered 2012-05-16 – 2012-05-19 (×4): 20 meq via ORAL
  Filled 2012-05-16 (×4): qty 1
  Filled 2012-05-16: qty 2

## 2012-05-16 MED ORDER — ENSURE PUDDING PO PUDG
1.0000 | Freq: Two times a day (BID) | ORAL | Status: DC
  Administered 2012-05-16 – 2012-05-19 (×6): 1 via ORAL

## 2012-05-16 NOTE — Progress Notes (Signed)
Patient Name: Trevor [name] Date of Encounter: 05/16/2012  Principal Problem:  *Lethargy Active Problems:  Atrial fibrillation  Ischemic heart disease  Congestive heart failure with left ventricular systolic dysfunction  COPD (chronic obstructive pulmonary disease)  Respiratory failure with hypercapnia  Acute respiratory failure with hypoxia  Shock  NSTEMI (non-ST elevated myocardial infarction)  Hypotension    SUBJECTIVE: Still SOB with minimal exertion but feels he is starting to get stronger. Using incentive spirometry.  OBJECTIVE Filed Vitals:   05/16/12 0523 05/16/12 0751 05/16/12 0903 05/16/12 0957  BP: 95/54 113/55  100/62  Pulse: 77 87  74  Temp: 98.3 F (36.8 C)   98.2 F (36.8 C)  TempSrc: Oral   Oral  Resp: 20 18  20   Height:      Weight: 163 lb 8 oz (74.163 kg)     SpO2: 97%  96% 92%    Intake/Output Summary (Last 24 hours) at 05/16/12 1255 Last data filed at 05/16/12 1002  Gross per 24 hour  Intake    963 ml  Output   1101 ml  Net   -138 ml   Weight change: -2 lb 4.8 oz (-1.043 kg) Filed Weights   05/14/12 0532 05/15/12 0552 05/16/12 0523  Weight: 171 lb 4.8 oz (77.7 kg) 165 lb 12.8 oz (75.206 kg) 163 lb 8 oz (74.163 kg)     PHYSICAL EXAM General: Well developed, well nourished, elderly male in no acute distress. Head: Normocephalic, atraumatic.  Neck: Supple without bruits, JVD elevated 10 cm. Lungs:  Resp regular and unlabored, bibasilar rales with some crackles. Heart: RRR, S1, S2, no S3, S4, 2+ murmur. Abdomen: Soft, non-tender, non-distended, BS + x 4.  Extremities: No clubbing, cyanosis, 2+ edema.  Neuro: Alert and oriented X 3. Moves all extremities spontaneously. Psych: Normal affect.  LABS: CBC: Basename 05/14/12 0550  WBC 8.4  NEUTROABS --  HGB 10.4*  HCT 31.4*  MCV 79.1  PLT 117*   Basic Metabolic Panel: Basename 05/16/12 0650 05/15/12 0611  NA 139 136  K 3.6 3.7  CL 101 96  CO2 33* 32  GLUCOSE 102* 105*  BUN  19 17  CREATININE 0.63 0.56  CALCIUM 7.7* 8.5  MG -- --  PHOS -- --   BNP: Pro B Natriuretic peptide (BNP)  Date/Time Value Range Status  05/14/2012  5:50 AM 2190.0* 0 - 450 pg/mL Final  04/25/2012 10:48 PM 14991.0* 0 - 450 pg/mL Final   TELE:  SR with occ PVCs and pairs  Radiology/Studies: No results found. 48 hours  Current Medications:     . a stronger pump book   Does not apply Once  . aspirin EC  81 mg Oral Daily  . atorvastatin  20 mg Oral q1800  . calcium carbonate  1 tablet Oral BID  . clopidogrel  75 mg Oral Q breakfast  . docusate sodium  100 mg Oral BID  . feeding supplement  1 Container Oral BID BM  . ferrous sulfate  325 mg Oral Q breakfast  . furosemide  40 mg Intravenous BID  . hydrocortisone sodium succinate  12.5 mg Intravenous Q12H  . losartan  12.5 mg Oral Daily  . multivitamin with minerals  1 tablet Oral Daily  . pantoprazole  40 mg Oral QHS  . sodium chloride  3 mL Intravenous Q12H  . tiotropium  18 mcg Inhalation Daily   ASSESSMENT AND PLAN:  1.Atrial fibrillation- Has had paroxysms of atrial fibrillation but none in the last  24 hours. SR this am. Patient has been deemed a poor Coumadin candidate on admission. On exam, he appears frail, he is deconditioned and a fall risk. Would hold off on Coumadin for now. He remains on ASA/Plavix.   2. Nocturnal bradycardia- He has had bradycardia during the night while sleeping with evidence of 2:1 AV block. On awakening, HR increases. Seen by Dr. Johney Frame with EP 05/15/12 and since he has no symptoms of AV block and all episodes are nocturnal, he would not recommend a PPM at this point. The patient and his family would like to avoid procedures also. He had no sustained HR < 50 overnight  3. Acute on chronic systolic CHF- patient is still volume overloaded but diuresing well. Weight is decreasing. Will continue IV Lasix today. Recheck labs and CXR in am. K+ trending down, supp.  4. NSTEMI/CAD- stable. Did have mildly  elevated cardiac biomarkers earlier this admission believed to be secondary to demand ischemia during code blue/hypotension/cardiogenic shock. Will continue ASA/Plavix/ARB/statin/NTG SL PRN.   5. Ischemic Cardiomyopathy: Continue medical therapy long term, He is not a candidate for an ICD given his advanced age and comorbidities.   6. Deconditioning- PT/OT recommend SNF placement at discharge. This would be appropriate given patient's level of deconditioning. SW involved.   7. Acute respiratory failure- earlier this admission - attributed to patient's lethargy (hypercarbic encephalopathy) requiring intubation. The vent was weaned and he was successfully extubated. Doing much better.   8. COPD- baseline pulmonary dysfunction.   9. Cardiogenic shock/hypotension- earlier this admit - evidence of prior inferior infarct, inferolateral akinesis on echo, narcotics contributing. He required pressors shortly after code blue earlier this admission. Adrenal insufficiency suspected, steroids tapering. BP has improved. BP is normal today.   10. Altered mental status- resolved, avoid narcotics/ sedatives   11. Disposition- patient's admission has been complicated by poor pulmonary function/respiratory status, code blue, AMS and slow diuresis. Planning for d/c to SNF Monday if he continues to improve.    Signed, Theodore Demark , PA-C 12:55 PM 05/16/2012  I have personally seen and examined this patient with Theodore Demark, PA-C. I agree with the assessment and plan as outlined above. He continues to improve. He is diuresing well. Will hopefully be ready for d/c to SNF on Monday. All cardiac issues are stable/improving.   Machel Violante 5:33 PM 05/16/2012

## 2012-05-16 NOTE — Progress Notes (Signed)
Occupational Therapy Treatment Patient Details Name: Trevor [name] MRN: [id number] DOB: November 13, 1924 Today's Date: 05/16/2012 Time: 4098-1191 OT Time Calculation (min): 17 min  OT Assessment / Plan / Recommendation Comments on Treatment Session Pt making progress    Follow Up Recommendations  Skilled nursing facility    Barriers to Discharge       Equipment Recommendations  Defer to next venue    Recommendations for Other Services    Frequency Min 2X/week   Plan Discharge plan remains appropriate    Precautions / Restrictions Precautions Precautions: Fall Required Braces or Orthoses: Other Brace/Splint Other Brace/Splint: Left built up shoe Restrictions Weight Bearing Restrictions: No        ADL  Grooming: Performed;Wash/dry hands;Wash/dry face;Teeth care;Brushing hair;Supervision/safety;Set up Where Assessed - Grooming: Unsupported standing Lower Body Dressing: Performed;Set up;Supervision/safety (with minguard A sit to stand and standing) Where Assessed - Lower Body Dressing: Unsupported sit to stand Toilet Transfer: Simulated;Min guard Toilet Transfer Method: Sit to Barista:  (recliner to sink to groom and back to recliner) Equipment Used: Rolling walker Transfers/Ambulation Related to ADLs: Min guard A    OT Diagnosis:    OT Problem List:   OT Treatment Interventions:     OT Goals ADL Goals ADL Goal: Grooming - Progress: Progressing toward goals ADL Goal: Lower Body Dressing - Progress: Met  Visit Information  Last OT Received On: 05/16/12 Assistance Needed: +1    Subjective Data  Subjective: Did I do good?   Prior Functioning       Cognition  Overall Cognitive Status: Appears within functional limits for tasks assessed/performed Arousal/Alertness: Awake/alert Orientation Level: Appears intact for tasks assessed Behavior During Session: [facility] for tasks performed    Mobility Transfers Transfers: Sit to Stand;Stand to  Sit Sit to Stand: 4: Min guard;With armrests;From chair/3-in-1;With upper extremity assist Stand to Sit: 4: Min guard;With upper extremity assist;With armrests;To chair/3-in-1   Exercises    Balance    End of Session OT - End of Session Activity Tolerance: Patient tolerated treatment well Patient left: in chair;with call bell/phone within reach Nurse Communication:  (aware of the tasks that patient had completed at sink)       Evette Georges [phone] 05/16/2012, 3:00 PM

## 2012-05-16 NOTE — Progress Notes (Signed)
Physical Therapy Treatment Patient Details Name: Trevor [name] MRN: [id number] DOB: 09/12/1925 Today's Date: 05/16/2012 Time: 4098-1191 PT Time Calculation (min): 28 min  PT Assessment / Plan / Recommendation Comments on Treatment Session  Pt s/p afib with RVR, COPD, compression fx, and h/o LLE shorter now with dependencies in mobility due to weakness and decr balance. Pt continues to progress and will benefit from continued PT with goal of home with family (ultimately after SNF)    Follow Up Recommendations  Skilled nursing facility;Supervision/Assistance - 24 hour    Barriers to Discharge        Equipment Recommendations  Defer to next venue    Recommendations for Other Services    Frequency Min 3X/week   Plan Discharge plan remains appropriate;Frequency remains appropriate    Precautions / Restrictions Precautions Precautions: Fall Required Braces or Orthoses: Other Brace/Splint Other Brace/Splint: Lt built up shoe Restrictions Weight Bearing Restrictions: No   Pertinent Vitals/Pain No reports of pain; dyspnea 2/4 with activity    Mobility  Bed Mobility Bed Mobility: Not assessed Transfers Transfers: Sit to Stand;Stand to Sit Sit to Stand: 4: Min guard;With upper extremity assist;With armrests;From chair/3-in-1;From elevated surface (pillow in seat of recliner) Stand to Sit: 4: Min guard;With upper extremity assist;To elevated surface;To chair/3-in-1 (pillow in seat) Details for Transfer Assistance: x 10 reps; with armrests, pt able to perform with minguard assist with proper hand placement; with hands on knees (to incr use of legs), pt requires mod assist; pt with difficulty with anterior wtshift due to back pain and large abd hernias Ambulation/Gait Ambulation/Gait Assistance: 4: Min guard Ambulation Distance (Feet): 70 Feet Assistive device: Rolling walker Ambulation/Gait Assistance Details: cues for safe use of RW (keep closer to body and smaller steps to  turn) Gait Pattern: Step-through pattern;Decreased stride length;Trunk flexed;Wide base of support    Exercises General Exercises - Lower Extremity Ankle Circles/Pumps: AROM;Both;10 reps;Seated;Other (comment) (pushing toes down against floor) Long Arc Quad: AROM;Both;10 reps;Seated Heel Raises: AROM;Both;10 reps;Standing   PT Diagnosis:    PT Problem List:   PT Treatment Interventions:     PT Goals Acute Rehab PT Goals PT Goal Formulation: With patient Time For Goal Achievement: 05/23/12 Potential to Achieve Goals: Good Pt will go Supine/Side to Sit: with supervision PT Goal: Supine/Side to Sit - Progress: Goal set today Pt will go Sit to Supine/Side: with supervision PT Goal: Sit to Supine/Side - Progress: Goal set today Pt will go Sit to Stand: with modified independence;with upper extremity assist;from elevated surface PT Goal: Sit to Stand - Progress: Goal set today Pt will go Stand to Sit: with modified independence;to elevated surface;with upper extremity assist PT Goal: Stand to Sit - Progress: Goal set today Pt will Ambulate: >150 feet;with supervision;with least restrictive assistive device PT Goal: Ambulate - Progress: Goal set today  Visit Information  Last PT Received On: 05/16/12 Assistance Needed: +1    Subjective Data  Subjective: Pt reports LLE shorter than Rt since 1984 surgeries--uses built up shoe Patient Stated Goal: To be able to return home soon   Cognition  Overall Cognitive Status: Appears within functional limits for tasks assessed/performed Arousal/Alertness: Awake/alert Orientation Level: Appears intact for tasks assessed Behavior During Session: [facility] for tasks performed    Balance  Balance Balance Assessed: Yes Static Standing Balance Static Standing - Balance Support: No upper extremity supported Static Standing - Level of Assistance: 5: Stand by assistance Dynamic Standing Balance Dynamic Standing - Balance Support: Bilateral upper  extremity supported Dynamic Standing -  Balance Activities: Forward lean/weight shifting;Other (comment) (toe raises)  End of Session PT - End of Session Equipment Utilized During Treatment: Other (comment) (shoes) Activity Tolerance: Patient tolerated treatment well Patient left: in chair;with call bell/phone within reach Nurse Communication: Mobility status   GP     Trevor [name] 05/16/2012, 11:11 AM Pager [phone]

## 2012-05-16 NOTE — Progress Notes (Signed)
I cosign for Karen Wall's assessment, med administration, notes, I/O, and care plan education. 

## 2012-05-17 DIAGNOSIS — I10 Essential (primary) hypertension: Secondary | ICD-10-CM

## 2012-05-17 LAB — BASIC METABOLIC PANEL
BUN: 22 mg/dL (ref 6–23)
CO2: 35 mEq/L — ABNORMAL HIGH (ref 19–32)
Calcium: 8 mg/dL — ABNORMAL LOW (ref 8.4–10.5)
Chloride: 98 mEq/L (ref 96–112)
Creatinine, Ser: 0.65 mg/dL (ref 0.50–1.35)
GFR calc Af Amer: >90 mL/min (ref >90)

## 2012-05-17 MED ORDER — POTASSIUM CHLORIDE CRYS ER 20 MEQ PO TBCR
40.0000 meq | EXTENDED_RELEASE_TABLET | Freq: Once | ORAL | Status: AC
  Administered 2012-05-17: 40 meq via ORAL

## 2012-05-17 NOTE — Progress Notes (Signed)
Patient Name: Trevor [name] Date of Encounter: 05/17/2012  SUBJECTIVE: Denies CP, SOB and edema are improving  OBJECTIVE Filed Vitals:   05/16/12 1727 05/16/12 2201 05/17/12 0435 05/17/12 0811  BP: 118/60 112/49 124/65   Pulse: 77 74 83   Temp:  98.4 F (36.9 C) 97.9 F (36.6 C)   TempSrc:  Oral Oral   Resp: 20 18 18    Height:      Weight:   160 lb 4.8 oz (72.712 kg)   SpO2: 98% 97% 95% 93%    Intake/Output Summary (Last 24 hours) at 05/17/12 0927 Last data filed at 05/17/12 0851  Gross per 24 hour  Intake   1203 ml  Output   1875 ml  Net   -672 ml   Weight change: -3 lb 3.2 oz (-1.452 kg) Filed Weights   05/15/12 0552 05/16/12 0523 05/17/12 0435  Weight: 165 lb 12.8 oz (75.206 kg) 163 lb 8 oz (74.163 kg) 160 lb 4.8 oz (72.712 kg)     PHYSICAL EXAM General: Well developed, well nourished, elderly male in no acute distress. Head: Normocephalic, atraumatic.  Neck: Supple without bruits, JVD elevated 8cm. Lungs:  Resp regular and unlabored, bibasilar rales with some crackles. Heart: RRR, S1, S2, no S3, S4, 2+ murmur. Abdomen: Soft, non-tender, non-distended, BS + x 4.  Extremities: No clubbing, cyanosis, 1+ edema (improving).  Neuro: Alert and oriented X 3. Moves all extremities spontaneously. Psych: Normal affect.  LABS: CBC:No results found for this basename: WBC:2,NEUTROABS:2,HGB:2,HCT:2,MCV:2,PLT:2 in the last 72 hours Basic Metabolic Panel:  Basename 05/17/12 0549 05/16/12 0650  NA 137 139  K 3.2* 3.6  CL 98 101  CO2 35* 33*  GLUCOSE 133* 102*  BUN 22 19  CREATININE 0.65 0.63  CALCIUM 8.0* 7.7*  MG -- --  PHOS -- --   BNP: Pro B Natriuretic peptide (BNP)  Date/Time Value [name] Status  05/14/2012  5:50 AM 2190.0* 0 - 450 pg/mL Final  04/25/2012 10:48 PM 14991.0* 0 - 450 pg/mL Final   TELE:  SR with nocturnal second degree AV block  Radiology/Studies: Dg Chest 2 View  05/16/2012  *RADIOLOGY REPORT*  Clinical Data: CHF  CHEST - 2 VIEW   Comparison: None  Findings: Heart size appears normal.  The lung volumes are low. Small bilateral pleural effusions are identified and appear improved from previous exam.  There has also been improvement in interstitial edema.  Atelectasis is noted in both lung bases.  IMPRESSION:  1.  Improving CHF pattern.  Original Report Authenticated By: Rosealee Albee, M.D.   48 hours  Current Medications:     . a stronger pump book   Does not apply Once  . aspirin EC  81 mg Oral Daily  . atorvastatin  20 mg Oral q1800  . calcium carbonate  1 tablet Oral BID  . clopidogrel  75 mg Oral Q breakfast  . docusate sodium  100 mg Oral BID  . feeding supplement  1 Container Oral BID BM  . ferrous sulfate  325 mg Oral Q breakfast  . furosemide  40 mg Intravenous BID  . hydrocortisone sodium succinate  12.5 mg Intravenous Q12H  . losartan  12.5 mg Oral Daily  . multivitamin with minerals  1 tablet Oral Daily  . pantoprazole  40 mg Oral QHS  . potassium chloride  20 mEq Oral Daily  . potassium chloride  40 mEq Oral Once  . sodium chloride  3 mL Intravenous Q12H  . tiotropium  18 mcg Inhalation Daily  . DISCONTD: feeding supplement  1 Container Oral BID BM   ASSESSMENT AND PLAN:  1.Atrial fibrillation-  Presently in sinus rhythm. Patient has been deemed a poor Coumadin candidate on admission.  Would hold off on Coumadin for now. He remains on ASA/Plavix.   2. Nocturnal bradycardia-Asymptomatic, I would not recommend a PPM at this point. The patient and his family would like to avoid procedures also.    3. Acute on chronic systolic CHF-  Improving, continue current diuresis  4. NSTEMI/CAD- stable. Did have mildly elevated cardiac biomarkers earlier this admission believed to be secondary to demand ischemia during code blue/hypotension/cardiogenic shock. Will continue ASA/Plavix/ARB/statin/NTG SL PRN.   5. Ischemic Cardiomyopathy: Continue medical therapy long term, He is not a candidate for an ICD given  his advanced age and comorbidities.   6. Deconditioning- PT/OT recommend SNF placement at discharge. This would be appropriate given patient's level of deconditioning. SW involved.   7. Acute respiratory failure- improved  8. COPD- baseline pulmonary dysfunction.   9. Altered mental status- resolved, avoid narcotics/ sedatives   10. Disposition-  Planning for d/c to SNF Monday if he continues to improve.    Trevor [name] 9:27 AM 05/17/2012

## 2012-05-18 LAB — BASIC METABOLIC PANEL
CO2: 33 mEq/L — ABNORMAL HIGH (ref 19–32)
Calcium: 8.1 mg/dL — ABNORMAL LOW (ref 8.4–10.5)
Creatinine, Ser: 0.68 mg/dL (ref 0.50–1.35)
GFR calc non Af Amer: 85 mL/min — ABNORMAL LOW (ref >90)
Glucose, Bld: 106 mg/dL — ABNORMAL HIGH (ref 70–99)

## 2012-05-18 NOTE — Progress Notes (Signed)
Patient Name: Trevor [name] Date of Encounter: 05/18/2012  SUBJECTIVE: Denies CP, SOB and edema are improving No events overnight  OBJECTIVE Filed Vitals:   05/17/12 1442 05/17/12 2158 05/18/12 0428 05/18/12 0750  BP: 112/55 108/52 110/58   Pulse: 74 79 82   Temp: 97.4 F (36.3 C) 98.1 F (36.7 C) 98.4 F (36.9 C)   TempSrc:  Oral Oral   Resp: 19 20 20    Height:      Weight:   162 lb 0.6 oz (73.5 kg)   SpO2: 94% 96% 96% 95%    Intake/Output Summary (Last 24 hours) at 05/18/12 1109 Last data filed at 05/18/12 0858  Gross per 24 hour  Intake   1180 ml  Output   2750 ml  Net  -1570 ml   Weight change: 1 lb 11.8 oz (0.788 kg) Filed Weights   05/16/12 0523 05/17/12 0435 05/18/12 0428  Weight: 163 lb 8 oz (74.163 kg) 160 lb 4.8 oz (72.712 kg) 162 lb 0.6 oz (73.5 kg)     PHYSICAL EXAM General: Well developed, well nourished, elderly male in no acute distress. Head: Normocephalic, atraumatic.  Neck: Supple without bruits, JVD elevated 8cm. Lungs:  Resp regular and unlabored, bibasilar rales with some crackles. Heart: RRR, S1, S2, no S3, S4, 2+ murmur. Abdomen: Soft, non-tender, non-distended, BS + x 4.  Extremities: No clubbing, cyanosis, trace edema (improving).  Neuro: Alert and oriented X 3. Moves all extremities spontaneously. Psych: Normal affect.  LABS: CBC:No results found for this basename: WBC:2,NEUTROABS:2,HGB:2,HCT:2,MCV:2,PLT:2 in the last 72 hours Basic Metabolic Panel:  Basename 05/18/12 0545 05/17/12 0549  NA 137 137  K 3.8 3.2*  CL 99 98  CO2 33* 35*  GLUCOSE 106* 133*  BUN 20 22  CREATININE 0.68 0.65  CALCIUM 8.1* 8.0*  MG -- --  PHOS -- --   BNP: Pro B Natriuretic peptide (BNP)  Date/Time Value Range Status  05/14/2012  5:50 AM 2190.0* 0 - 450 pg/mL Final  04/25/2012 10:48 PM 14991.0* 0 - 450 pg/mL Final   TELE:  SR with nocturnal second degree AV block  Radiology/Studies: Dg Chest 2 View  05/16/2012  *RADIOLOGY REPORT*  Clinical  Data: CHF  CHEST - 2 VIEW  Comparison: None  Findings: Heart size appears normal.  The lung volumes are low. Small bilateral pleural effusions are identified and appear improved from previous exam.  There has also been improvement in interstitial edema.  Atelectasis is noted in both lung bases.  IMPRESSION:  1.  Improving CHF pattern.  Original Report Authenticated By: Rosealee Albee, M.D.   48 hours  Current Medications:     . a stronger pump book   Does not apply Once  . aspirin EC  81 mg Oral Daily  . atorvastatin  20 mg Oral q1800  . calcium carbonate  1 tablet Oral BID  . clopidogrel  75 mg Oral Q breakfast  . docusate sodium  100 mg Oral BID  . feeding supplement  1 Container Oral BID BM  . ferrous sulfate  325 mg Oral Q breakfast  . furosemide  40 mg Intravenous BID  . hydrocortisone sodium succinate  12.5 mg Intravenous Q12H  . losartan  12.5 mg Oral Daily  . multivitamin with minerals  1 tablet Oral Daily  . pantoprazole  40 mg Oral QHS  . potassium chloride  20 mEq Oral Daily  . potassium chloride  40 mEq Oral Once  . sodium chloride  3 mL  Intravenous Q12H  . tiotropium  18 mcg Inhalation Daily   ASSESSMENT AND PLAN:  1.Atrial fibrillation-  Presently in sinus rhythm. Patient has been deemed a poor Coumadin candidate on admission.  Would hold off on Coumadin for now. He remains on ASA/Plavix.   2. Nocturnal bradycardia-Asymptomatic, I would not recommend a PPM at this point. The patient and his family would like to avoid procedures also.    3. Acute on chronic systolic CHF-  Improving, continue current diuresis  4. NSTEMI/CAD- stable.   Will continue ASA/Plavix/ARB/statin/NTG SL PRN.   5. Ischemic Cardiomyopathy: Continue medical therapy long term, He is not a candidate for an ICD given his advanced age and comorbidities.   6. Deconditioning- PT/OT recommend SNF placement at discharge. This would be appropriate given patient's level of deconditioning. SW involved.    7. Acute respiratory failure- improved  8. COPD- baseline pulmonary dysfunction.   9. Altered mental status- resolved, avoid narcotics/ sedatives   10. Disposition-  Plan to discharge to SNF tomorrow am.   [name] 11:09 AM 05/18/2012

## 2012-05-19 ENCOUNTER — Telehealth: Payer: Self-pay | Admitting: Cardiology

## 2012-05-19 MED ORDER — BISACODYL 5 MG PO TBEC
5.0000 mg | DELAYED_RELEASE_TABLET | Freq: Every day | ORAL | Status: DC | PRN
  Administered 2012-05-19 (×2): 5 mg via ORAL
  Filled 2012-05-19 (×2): qty 1

## 2012-05-19 MED ORDER — ASPIRIN EC 81 MG PO TBEC: 81.0000 mg | DELAYED_RELEASE_TABLET | Freq: Every day | ORAL | Status: DC

## 2012-05-19 MED ORDER — PANTOPRAZOLE SODIUM 40 MG PO TBEC: 40.0000 mg | DELAYED_RELEASE_TABLET | Freq: Every day | ORAL | Status: DC

## 2012-05-19 MED ORDER — PREDNISONE 5 MG PO TABS: ORAL_TABLET | ORAL | Status: DC

## 2012-05-19 MED ORDER — ENSURE PUDDING PO PUDG: 1.0000 | Freq: Two times a day (BID) | ORAL | Status: DC

## 2012-05-19 MED ORDER — LOSARTAN POTASSIUM 25 MG PO TABS: 12.5000 mg | ORAL_TABLET | Freq: Every day | ORAL | Status: DC

## 2012-05-19 NOTE — Discharge Summary (Signed)
CARDIOLOGY DISCHARGE SUMMARY    Patient ID: Trevor [name],  MRN: [id number], DOB/AGE: 04-10-1925 78 y.o.  Admit date: 04/23/2012 Discharge date: 05/19/2012  Primary Cardiologist: Cassell Clement, MD  Primary Discharge Diagnosis:  1. Atrial fibrillation  2. Nocturnal bradycardia 3. Acute on chronic systolic CHF 4. NSTEMI/CAD 5. Ischemic Cardiomyopathy 6. Deconditioning 7. Acute respiratory failure 8. COPD 9. Falls, not a Coumadin candidate at this time  Secondary Discharge Diagnoses:  1. Iron deficiency anemia 2. HTN 3. Dyslipidemia 4. Chronic back pain 5. History of bladder cancer 6. Depression 7. Osteoarthritis  Procedures This Admission:  1. Head CT 04/23/2012   Findings: Motion degraded exam. No intracranial hemorrhage. No CT evidence of large acute infarct. Small vessel disease type changes. Global atrophy without hydrocephalus. Vascular calcifications. No intracranial mass lesion detected on this unenhanced exam. Minimal mucosal thickening maxillary sinuses. 2. Transthoracic echocardiogram 04/24/2012  - Left ventricle: The cavity size was normal. Wall thickness was increased in a pattern of mild LVH. Systolic function was severely reduced. The estimated ejection fraction was in the range of 25% to 30%. There is akinesis and scarring of the inferolateral and inferior myocardium. The study is not technically sufficient to allow evaluation of LV diastolic function. - Aortic valve: Mildly calcified annulus. Trileaflet; moderately calcified leaflets. Trivial regurgitation. - Mitral valve: Mildly thickened leaflets . Mobility of the posterior leaflet was restricted. Moderate regurgitation directed eccentrically. - Left atrium: The atrium was moderately dilated. - Right ventricle: The cavity size was mildly dilated. Systolic function was mildly reduced. - Right atrium: The atrium was moderately dilated. - Tricuspid valve: Moderate-severe regurgitation. - Pulmonic valve:  Mild regurgitation. - Pulmonary arteries: PA peak pressure: 44mm Hg (S). - Pericardium, extracardiac: There was no pericardial effusion.  History and Hospital Course:  Trevor [name] is an 78 year old gentleman with CAD, ishemic cardiomyopathy, chronic systolic CHF who was admitted by Dr. Patty Sermons from the office for evaluation of profound lethargy, acute on chronic systolic CHF and new AF. ProBNP 4900. ABGs were done revealing hypercarbic respiratory failure. His head CT was negative and IV heparin was then initiated for anticoagulation in setting of new AF. Of note, on admission he was deemed a poor long term Coumadin candidate due to falls. There was concern he was hypothyroid; however, his thyroid studies proved he was euthyroid. He was diuresed and his medical therapy for CHF and CAD was continued. Despite aggressive diuresis, he continued to have hypoxemia requiring BiPAP. On 04/25/2012, he became hemodynamically unstable and suffered acute respiratory failure. Code blue was called. He required intubation. He was profoundly hypotensive requiring IV pressors. His chest x-ray showed diffuse bilateral infiltrates. He was started on IV antibiotics empirically for possible PNA. Bedside echo showed LVEF 25%. He converted to NSR. He was extubated on 04/28/2012. His blood cultures were negative so IV Zosyn and IV vancomycin were discontinued. He slowly improved and IV pressors were discontinued on 04/30/2012. On 05/02/2012 a PT/OT consultation was obtained. Medical therapy for CAD/CHF was cautiously resumed. He was transferred to telemetry. He experienced nocturnal bradycardia but did not have any significant AV block or pauses. This was monitored and his Coreg dose adjusted. He was maintaining NSR. Also while on telemetry working with PT, his back pain worsened (has chronic pain with known compression fractures present on admission). It was felt IV Fentanyl was not providing enough pain relief and Fentanyl patch  was added in addition to amitriptyline at bedtime. On 05/06/2012, he was found unresponsive and rapid response  was activated. He was evaluated by Dr. Antoine Poche who ordered Narcan which improved his respirations and mental status. Fentanyl and amitriptyline were discontinued. PCCM consultation was obtained for assistance managing his airway. He was intubated and transferred to the CCU. He became hypotensive requiring IV pressors. His cardiac enzymes were mildly elevated felt to represent demand ischemia (not ACS or acute plaque rupture) in setting of acute respiratory failure and hypoxemia. He was extubated on 05/07/2012. His BP and mental status improved. He remained in NSR. On 05/08/2012, he was transferred to step down. His medical therapy was resumed at low doses. PT therapy resumed. On 05/10/2012, he was transferred to telemetry. He continued to experience nocturnal bradycardia with evidence of 2:1 AV block. His beta blocker was held. Dr. Johney Frame reviewed his rhythm strip and because he has no symptoms of AV block and all episodes are nocturnal, PPM was not indicated. In addition, he was deemed not a candidate for ICD given his advanced age, acute CHF and comorbidities. Dr. Johney Frame discussed this with the patient and his family who were in agreement.  He continued to improve slowly and remained hemodynamically stable. His volume status improved with gentle diuresis. He has been seen, examined and deemed stable for discharge to SNF today by Dr. [name].  Discharge Vitals: Blood pressure 130/72, pulse 75, temperature 97.9 F (36.6 C), temperature source Oral, resp. rate 18, height 5\' 1"  (1.549 m), weight 157 lb 3 oz (71.3 kg), SpO2 99%.   Labs: Lab Results  Component Value Date   WBC 8.4 05/14/2012   HGB 10.4* 05/14/2012   HCT 31.4* 05/14/2012   MCV 79.1 05/14/2012   PLT 117* 05/14/2012    Lab 05/18/12 0545  NA 137  K 3.8  CL 99  CO2 33*  BUN 20  CREATININE 0.68  CALCIUM 8.1*  PROT --  BILITOT  --  ALKPHOS --  ALT --  AST --  GLUCOSE 106*   Lab Results  Component Value Date   CKTOTAL 31 05/08/2012   CKMB 4.7* 05/08/2012   TROPONINI <0.30 05/08/2012     Disposition:  The patient is being discharged in stable condition.  Follow-up: Follow-up Information    Follow up with Cassell Clement, MD on 05/26/2012. (At 10:30 AM)    Contact information:   [facility] [address] [place] [address] [phone]   Discharge Medications:  Medication List  As of 05/19/2012 10:50 AM   STOP taking these medications         carvedilol 6.25 MG tablet      chlordiazePOXIDE-amitriptyline 10-25 MG Tabs      nitroGLYCERIN 0.4 mg/hr      spironolactone 25 MG tablet         TAKE these medications         acetaminophen 325 MG tablet   Commonly known as: TYLENOL   Take 650 mg by mouth every 6 (six) hours as needed. For pain      aspirin EC 81 MG tablet   Take 1 tablet (81 mg total) by mouth daily.      atorvastatin 20 MG tablet   Commonly known as: LIPITOR   Take 20 mg by mouth daily.      calcium carbonate 600 MG Tabs   Commonly known as: OS-CAL   Take 600 mg by mouth 2 (two) times daily with a meal.      clopidogrel 75 MG tablet   Commonly known as: PLAVIX   Take  1 tablet (75 mg total) by mouth daily.      feeding supplement Pudg   Take 1 Container by mouth 2 (two) times daily between meals.      ferrous gluconate 325 MG tablet   Commonly known as: FERGON   Take 325 mg by mouth daily with breakfast.      furosemide 40 MG tablet   Commonly known as: LASIX   Take 1 tablet (40 mg total) by mouth 2 (two) times daily.      losartan 25 MG tablet   Commonly known as: COZAAR   Take 0.5 tablets (12.5 mg total) by mouth daily.      multivitamin with minerals Tabs   Take 1 tablet by mouth daily.      nitroGLYCERIN 0.4 MG SL tablet   Commonly known as: NITROSTAT   Place 0.4 mg under the tongue every 5 (five) minutes as needed. For  chest pain      pantoprazole 40 MG tablet   Commonly known as: PROTONIX   Take 1 tablet (40 mg total) by mouth at bedtime.      potassium chloride SA 20 MEQ tablet   Commonly known as: K-DUR,KLOR-CON   Take 1 tablet (20 mEq total) by mouth 2 (two) times daily.      predniSONE 5 MG tablet   Commonly known as: DELTASONE   Taper dose: Take 2 tabs (10 mg) once daily x5 days, then 1 tab (5 mg) once daily x5 days, then 1/2 tab (2.5 mg) once daily x5 days then stop      tiotropium 18 MCG inhalation capsule   Commonly known as: SPIRIVA   Place 1 capsule (18 mcg total) into inhaler and inhale daily.          Duration of Discharge Encounter: Greater than 30 minutes including physician time.  Signed, Rick Duff, PA-C 05/19/2012, 10:50 AM   I have seen, examined the patient, and reviewed the above assessment and plan.  Changes to above are made where necessary.    Co Sign: [name], MD

## 2012-05-19 NOTE — Progress Notes (Signed)
Pt had episode of non sustain HR of 30, no S/S, BP 118/60, HR back to 77, pt has hx of this during this admission, MD aware of hx, will continue to monitor, Thanks, Lavonda Jumbo RN

## 2012-05-19 NOTE — Progress Notes (Signed)
Physical Therapy Treatment Patient Details Name: Trevor [name] MRN: [id number] DOB: [birth year] Today's Date: 05/19/2012 Time: 7829-5621 PT Time Calculation (min): 14 min  PT Assessment / Plan / Recommendation Comments on Treatment Session  Pt very motivated & willing to participate.  Cont's to improve/progress with mobility.      Follow Up Recommendations  Skilled nursing facility;Supervision/Assistance - 24 hour    Barriers to Discharge        Equipment Recommendations       Recommendations for Other Services    Frequency Min 3X/week   Plan Discharge plan remains appropriate;Frequency remains appropriate    Precautions / Restrictions Precautions Precautions: Fall Required Braces or Orthoses: Other Brace/Splint Other Brace/Splint: Left built up shoe       Mobility  Transfers Transfers: Sit to Stand;Stand to Sit Sit to Stand: 5: Supervision;With upper extremity assist;With armrests;From chair/3-in-1 Stand to Sit: 5: Supervision;With upper extremity assist;With armrests;To chair/3-in-1 Details for Transfer Assistance: performed 5x's for strengthening, endurance, & increased independence.     Ambulation/Gait Ambulation/Gait Assistance: 5: Supervision Ambulation Distance (Feet): 120 Feet Assistive device: Rolling walker Ambulation/Gait Assistance Details: Cues for tall posture.   Gait Pattern: Step-through pattern;Decreased stride length;Trunk flexed Stairs: No Wheelchair Mobility Wheelchair Mobility: No     PT Goals Acute Rehab PT Goals Time For Goal Achievement: 05/23/12 PT Goal: Sit to Stand - Progress: Progressing toward goal PT Goal: Stand to Sit - Progress: Progressing toward goal PT Goal: Ambulate - Progress: Progressing toward goal  Visit Information  Last PT Received On: 05/19/12 Assistance Needed: +1    Subjective Data      Cognition  Overall Cognitive Status: Appears within functional limits for tasks assessed/performed Arousal/Alertness:  Awake/alert Orientation Level: Appears intact for tasks assessed Behavior During Session: [name] for tasks performed    Balance     End of Session PT - End of Session Equipment Utilized During Treatment: Gait belt Activity Tolerance: Patient tolerated treatment well Patient left: in chair;with call bell/phone within reach Nurse Communication: Mobility status     Verdell Face, Trevor [name] [phone] 05/19/2012

## 2012-05-19 NOTE — Progress Notes (Signed)
Clinical Social Worker facilitated discharge by contacting family and facility, [facility]. CSW will place discharge packet, with EMS form and facesheet in designated room number at nurses station. CSW will sign off, as social work intervention is no longer needed.   Rozetta Nunnery MSW, [employer] [phone]

## 2012-05-19 NOTE — Progress Notes (Signed)
Patient Name: Trevor [name] [facility] Date of Encounter: 05/19/2012  SUBJECTIVE: Denies CP. SOB and edema are improving No events overnight  He feels he is ready for SNF.  OBJECTIVE Filed Vitals:   05/18/12 1340 05/18/12 2145 05/18/12 2217 05/19/12 0500  BP: 108/87 120/70 106/68 130/72  Pulse: 73 77 79 75  Temp: 97.8 F (36.6 C)  97.9 F (36.6 C) 97.9 F (36.6 C)  TempSrc: Oral  Oral Oral  Resp: 20 24 22 18   Height:      Weight:    157 lb 3 oz (71.3 kg)  SpO2: 97%  96% 99%    Intake/Output Summary (Last 24 hours) at 05/19/12 0856 Last data filed at 05/19/12 0234  Gross per 24 hour  Intake   1060 ml  Output   2751 ml  Net  -1691 ml   Weight change: -4 lb 13.6 oz (-2.2 kg) Filed Weights   05/17/12 0435 05/18/12 0428 05/19/12 0500  Weight: 160 lb 4.8 oz (72.712 kg) 162 lb 0.6 oz (73.5 kg) 157 lb 3 oz (71.3 kg)     PHYSICAL EXAM General: Well developed, well nourished, elderly male in no acute distress. Head: Normocephalic, atraumatic.  Neck: Supple without bruits, JVD elevated 8cm. Lungs:  Resp regular and unlabored, few bibasilar rales  Heart: RRR, S1, S2, no S3, S4, 2+ murmur. Abdomen: Soft, non-tender, non-distended, BS + x 4.  Extremities: No clubbing, cyanosis, trace edema (improving).  Neuro: Alert and oriented X 3. Moves all extremities spontaneously. Psych: Normal affect.  LABS: CBC:No results found for this basename: WBC:2,NEUTROABS:2,HGB:2,HCT:2,MCV:2,PLT:2 in the last 72 hours Basic Metabolic Panel:  Basename 05/18/12 0545 05/17/12 0549  NA 137 137  K 3.8 3.2*  CL 99 98  CO2 33* 35*  GLUCOSE 106* 133*  BUN 20 22  CREATININE 0.68 0.65  CALCIUM 8.1* 8.0*  MG -- --  PHOS -- --   BNP: Pro B Natriuretic peptide (BNP)  Date/Time Value Range Status  05/14/2012  5:50 AM 2190.0* 0 - 450 pg/mL Final  04/25/2012 10:48 PM 14991.0* 0 - 450 pg/mL Final   TELE:  SR with nocturnal bradycardia  Radiology/Studies: No results found. 48 hours  Current  Medications:     . a stronger pump book   Does not apply Once  . aspirin EC  81 mg Oral Daily  . atorvastatin  20 mg Oral q1800  . calcium carbonate  1 tablet Oral BID  . clopidogrel  75 mg Oral Q breakfast  . docusate sodium  100 mg Oral BID  . feeding supplement  1 Container Oral BID BM  . ferrous sulfate  325 mg Oral Q breakfast  . furosemide  40 mg Intravenous BID  . hydrocortisone sodium succinate  12.5 mg Intravenous Q12H  . losartan  12.5 mg Oral Daily  . multivitamin with minerals  1 tablet Oral Daily  . pantoprazole  40 mg Oral QHS  . potassium chloride  20 mEq Oral Daily  . sodium chloride  3 mL Intravenous Q12H  . tiotropium  18 mcg Inhalation Daily   ASSESSMENT AND PLAN:  1.Atrial fibrillation-  Presently in sinus rhythm. Patient has been deemed a poor Coumadin candidate on admission.  Would hold off on Coumadin for now. He remains on ASA/Plavix.   2. Nocturnal bradycardia-Asymptomatic, I would not recommend a PPM at this point. The patient and his family would like to avoid procedures also.    3. Acute on chronic systolic CHF-  Improving, continue current  diuresis  4. NSTEMI/CAD- stable.   Will continue ASA/Plavix/ARB/statin/NTG SL PRN.   5. Ischemic Cardiomyopathy: Continue medical therapy long term, He is not a candidate for an ICD given his advanced age and comorbidities.   6. Deconditioning- PT/OT recommend SNF placement at discharge. This would be appropriate given patient's level of deconditioning. SW involved.   7. Acute respiratory failure- improved  8. COPD- baseline pulmonary dysfunction.   9. Altered mental status- resolved, avoid narcotics/ sedatives   10. Disposition-  Plan to discharge to SNF today  Follow-up with Dr Patty Sermons in 2-3 weeks   [name] 8:56 AM 05/19/2012

## 2012-05-19 NOTE — Telephone Encounter (Signed)
New problem:  Per Nehemiah Settle PA, Transition  Care appt on 7/5 @ 10:30 with Dr. Patty Sermons.

## 2012-05-19 NOTE — Progress Notes (Signed)
Nursing Note: Pt to be discharged per MD's order. Pt has received all discharge information. IV and monitor discontinued per MD's order. Will escort pt to car safely upon discharge. Shivaan Tierno Scientist, clinical (histocompatibility and immunogenetics).

## 2012-05-19 NOTE — Progress Notes (Signed)
Name: Trevor [name] MRN: [id number] DOB: 1924/12/06    LOS: 26  PULMONARY / CRITICAL CARE MEDICINE   HPI:   77 year old male with PMH relevant for CAD, systolic CHF, Vfib, HTN, COPD Admitted initially to the Cardiology service for management of acute decompensated CHF. He was also started on heparin drip for new A. Fib.  code blue was called. He developed Vtach requiring CPR for a brief period of time with return to spontaneous circulation. He was intubated. 7/16 PCCM called bact to evaluate or lethargy post Tx with amitriptyline and Iv fentanyl.  remainss extubated, no distress  Vital Signs: Temp:  [97.8 F (36.6 C)-97.9 F (36.6 C)] 97.9 F (36.6 C) (07/29 0500) Pulse Rate:  [73-79] 75  (07/29 0500) Resp:  [18-24] 18  (07/29 0500) BP: (106-130)/(68-87) 130/72 mmHg (07/29 0500) SpO2:  [96 %-99 %] 99 % (07/29 0500) Weight:  [71.3 kg (157 lb 3 oz)] 71.3 kg (157 lb 3 oz) (07/29 0500)  Physical Examination: Gen: no distress HEENT: NCAT, old lt ij site noted. PULM: Coarse BS diffusely unchanged CV: RRR, no mgr AB: BS+, soft, nontender, no hsm Ext: warm, scd's Neuro:awake in chair eating  Intake/Output Summary (Last 24 hours) at 05/19/12 1047 Last data filed at 05/19/12 5784  Gross per 24 hour  Intake   1300 ml  Output   2901 ml  Net  -1601 ml   Principal Problem:  *Lethargy Active Problems:  Ischemic heart disease  Congestive heart failure with left ventricular systolic dysfunction  COPD (chronic obstructive pulmonary disease)  Atrial fibrillation  Respiratory failure with hypercapnia  Acute respiratory failure with hypoxia  Shock  NSTEMI (non-ST elevated myocardial infarction)  Hypotension  ASSESSMENT AND PLAN 78 y/o male with CHF, CAD and likely COPD admitted 7/3 with lethargy but developed respiratory failure on 7/5 leading to PEA arrest and VTach. He was intubated on 7/16 after an overdose of narcotics and amitriptyline. 7/16 increased lethargy from presumed  overdose, resolved  PULMONARY No results found for this basename: PHART:5,PCO2:5,PCO2ART:5,PO2ART:5,HCO3:5,O2SAT:5 in the last 168 hours Ventilator Settings:    ETT 7/16>>>7/17  A:   1) Acute hypercarbic and hypoxemic respiratory failure secondary to fentanyl and amitriptyline. 2) COPD with evidence of chronic hypercarbia. 3) Hx of  Pneumonia: aspiration vs. HCAP completed course of abx.  P:   - Titrate O2 for sat of 88-92%. - Change hydrocortisone to PO prednisone 10 mg PO daily x5 days, 5 mg PO daily x5 days then 2.5 mg PO daily x5 days then D/C. - PCCM will sign off, please call back if needed.  Koren Bound, M.D. [phone]

## 2012-05-19 NOTE — Progress Notes (Signed)
Pt information packet given to EMS. Pt discharged to [facility] via ambulance with all belongings.

## 2012-05-21 NOTE — Telephone Encounter (Signed)
Spoke with pt's wife.  Pt is at [facility].  He is being followed by Dr. Evlyn Kanner and has been evaluated already since he is there. Wife is aware of appt with Dr. Patty Sermons.  He will not qualify for transitions of care since he is in a nursing facility at the moment.

## 2012-05-26 ENCOUNTER — Ambulatory Visit (INDEPENDENT_AMBULATORY_CARE_PROVIDER_SITE_OTHER): Payer: Medicare Other | Admitting: Cardiology

## 2012-05-26 ENCOUNTER — Encounter: Payer: Self-pay | Admitting: Cardiology

## 2012-05-26 VITALS — BP 126/70 | HR 76 | Ht 62.0 in | Wt 154.0 lb

## 2012-05-26 DIAGNOSIS — I4891 Unspecified atrial fibrillation: Secondary | ICD-10-CM

## 2012-05-26 DIAGNOSIS — J449 Chronic obstructive pulmonary disease, unspecified: Secondary | ICD-10-CM

## 2012-05-26 DIAGNOSIS — R5381 Other malaise: Secondary | ICD-10-CM

## 2012-05-26 DIAGNOSIS — I259 Chronic ischemic heart disease, unspecified: Secondary | ICD-10-CM

## 2012-05-26 DIAGNOSIS — I2510 Atherosclerotic heart disease of native coronary artery without angina pectoris: Secondary | ICD-10-CM

## 2012-05-26 NOTE — Assessment & Plan Note (Signed)
The patient's wife is very supportive of him.  She hopes to get him back home but he needs to be able to walk around the house before he will be ready to return home.  He may need an extended period of physical therapy at the nursing home.

## 2012-05-26 NOTE — Assessment & Plan Note (Signed)
The patient has a history of paroxysmal atrial fibrillation.  EKG today shows that he is in normal sinus rhythm with a first-degree AV block.  The patient has not been having any TIA symptoms.

## 2012-05-26 NOTE — Progress Notes (Signed)
Trevor [name] Date of Birth:  24-Mar-1925 [facility] [address] [place], [place]  [address] [phone]         Fax   [phone]  History of Present Illness: This pleasant 78 year old gentleman is seen for a post hospital followup office visit.  He was discharged from the hospital on 05/19/12.  He had been hospitalized since 04/23/12 with atrial fibrillation, acute on chronic systolic heart failure and ischemic cardiomyopathy.  He also had acute respiratory failure and had to be on the ventilator briefly he has known COPD.  He is not on Coumadin because of a history of falls.  Post hospital he has been at [facility] where he has been receiving physical therapy.  He has been doing well there.  He still is not able to walk.  He is able to stand unassisted.  His appetite is good.  He feels like he is getting stronger.  He still has exertional dyspnea but is not having any chest pain not having any purulent sputum chills or fever  Current Outpatient Prescriptions  Medication Sig Dispense Refill  . acetaminophen (TYLENOL) 325 MG tablet Take 650 mg by mouth every 6 (six) hours as needed. For pain      . aspirin 81 MG tablet Take 1 tablet (81 mg total) by mouth daily.  30 tablet  6  . atorvastatin (LIPITOR) 20 MG tablet Take 20 mg by mouth daily.      . calcium carbonate (OS-CAL) 600 MG TABS Take 600 mg by mouth 2 (two) times daily with a meal.        . clopidogrel (PLAVIX) 75 MG tablet Take 1 tablet (75 mg total) by mouth daily.  90 tablet  3  . feeding supplement (ENSURE) PUDG Take 1 Container by mouth 2 (two) times daily between meals.  60 Container  3  . ferrous gluconate (FERGON) 325 MG tablet Take 325 mg by mouth daily with breakfast.      . furosemide (LASIX) 40 MG tablet Take 1 tablet (40 mg total) by mouth 2 (two) times daily.  180 tablet  3  . losartan (COZAAR) 25 MG tablet Take 0.5 tablets (12.5 mg total) by mouth daily.  30 tablet  3  . Multiple Vitamin  (MULTIVITAMIN WITH MINERALS) TABS Take 1 tablet by mouth daily.      . nitroGLYCERIN (NITROSTAT) 0.4 MG SL tablet Place 0.4 mg under the tongue every 5 (five) minutes as needed. For chest pain      . pantoprazole (PROTONIX) 40 MG tablet Take 1 tablet (40 mg total) by mouth at bedtime.  30 tablet  3  . potassium chloride SA (K-DUR,KLOR-CON) 20 MEQ tablet Take 1 tablet (20 mEq total) by mouth 2 (two) times daily.  180 tablet  3  . predniSONE (DELTASONE) 5 MG tablet Taper dose: Take 2 tabs (10 mg) once daily x5 days, then 1 tab (5 mg) once daily x5 days, then 1/2 tab (2.5 mg) once daily x5 days then stop  18 tablet  0  . tiotropium (SPIRIVA) 18 MCG inhalation capsule Place 1 capsule (18 mcg total) into inhaler and inhale daily.  90 capsule  3  . DISCONTD: furosemide (LASIX) 40 MG tablet Take 1 tablet (40 mg total) by mouth daily.  30 tablet  5    Allergies  Allergen Reactions  . Morphine And Related Anaphylaxis    Cardiac arrest  . Other Other (See Comments)    ALL SEDATIVES;  Lethargic,  can hardly function, etc.  . Lisinopril Cough  . Vioxx (Rofecoxib) Other (See Comments)    Lethargic, can hardly function, very hard to wake up    Patient Active Problem List  Diagnosis  . Ischemic heart disease  . Hypercholesterolemia  . Iron deficiency anemia  . Hx of bladder cancer  . Congestive heart failure with left ventricular systolic dysfunction  . Ischemic cardiomyopathy  . HTN (hypertension)  . COPD (chronic obstructive pulmonary disease)  . Lethargy  . Atrial fibrillation  . Respiratory failure with hypercapnia  . Acute respiratory failure with hypoxia  . Shock  . NSTEMI (non-ST elevated myocardial infarction)  . Hypotension    History  Smoking status  . Former Smoker -- 1.0 packs/day for 26 years  . Types: Cigarettes  . Quit date: 08/17/1983  Smokeless tobacco  . Former Neurosurgeon  . Types: Chew    History  Alcohol Use No    Family History  Problem Relation Age of Onset  .  Heart disease Father   . Heart attack Father   . Hypertension Father   . Emphysema Brother   . Coronary artery disease Sister     X5 CABG still living  . Heart failure Sister   . Hypertension Mother     Review of Systems: Constitutional: no fever chills diaphoresis or fatigue or change in weight.  Head and neck: no hearing loss, no epistaxis, no photophobia or visual disturbance. Respiratory: No cough, shortness of breath or wheezing. Cardiovascular: No chest pain peripheral edema, palpitations. Gastrointestinal: No abdominal distention, no abdominal pain, no change in bowel habits hematochezia or melena. Genitourinary: No dysuria, no frequency, no urgency, no nocturia. Musculoskeletal:No arthralgias, no back pain, no gait disturbance or myalgias. Neurological: No dizziness, no headaches, no numbness, no seizures, no syncope, no weakness, no tremors. Hematologic: No lymphadenopathy, no easy bruising. Psychiatric: No confusion, no hallucinations, no sleep disturbance.    Physical Exam: Filed Vitals:   05/26/12 1044  BP: 126/70  Pulse: 76   the general appearance reveals a elderly gentleman in a wheelchair in no acute distress.  He does not require nasal oxygen.Pupils equal and reactive.   Extraocular Movements are full.  There is no scleral icterus.  The mouth and pharynx are normal.  The neck is supple.  The carotids reveal no bruits.  The jugular venous pressure is normal.  The thyroid is not enlarged.  There is no lymphadenopathy.  The chest reveals diminished breath sounds at the bases.  The heart reveals no murmur gallop rub or click.  The abdomen is soft and nontender without hepatosplenomegaly or masses extremities reveal trace ankle edema.  EKG today shows normal sinus rhythm with first degree block and right bundle branch block and old inferolateral MI   Assessment / Plan: Continue on same medication.  Continue aggressive physical therapy with 18 to get him ambulatory  again in order to return home.  He check here in one month for followup office visit.

## 2012-05-26 NOTE — Patient Instructions (Addendum)
Your physician recommends that you continue on your current medications as directed. Please refer to the Current Medication list given to you today.  Your physician recommends that you schedule a follow-up appointment in: 4 weeks office visit with Dawayne Patricia. NP or  Dr. Patty Sermons

## 2012-05-26 NOTE — Assessment & Plan Note (Signed)
The patient has a history of a remote inferior wall myocardial infarction.  He has significant left ventricular systolic dysfunction.  Anaphylaxis scan in January 2011 showing an ejection fraction of 44% with a large area of infarct involving the inferoseptal basal inferior and inferolateral walls.  He has a history of atrial fibrillation with chronic right bundle branch block.  He has not had any recent chest pain or angina

## 2012-06-03 ENCOUNTER — Ambulatory Visit: Payer: Medicare Other | Admitting: Cardiology

## 2012-06-03 ENCOUNTER — Telehealth: Payer: Self-pay | Admitting: Cardiology

## 2012-06-03 ENCOUNTER — Other Ambulatory Visit: Payer: Medicare Other

## 2012-06-03 NOTE — Telephone Encounter (Signed)
New problem:  Patient wife calling need a rx for a walker. Will be getting out of rehab this week. Fax to wife [phone].

## 2012-06-03 NOTE — Telephone Encounter (Signed)
Left message to call back  

## 2012-06-04 NOTE — Telephone Encounter (Signed)
Rx sent for transportation walker to [facility]

## 2012-06-11 ENCOUNTER — Telehealth: Payer: Self-pay | Admitting: Cardiology

## 2012-06-11 ENCOUNTER — Encounter: Payer: Self-pay | Admitting: Cardiology

## 2012-06-11 ENCOUNTER — Ambulatory Visit (INDEPENDENT_AMBULATORY_CARE_PROVIDER_SITE_OTHER): Payer: Medicare Other | Admitting: Cardiology

## 2012-06-11 VITALS — BP 120/60 | HR 80 | Ht 62.0 in | Wt 158.0 lb

## 2012-06-11 DIAGNOSIS — E7800 Pure hypercholesterolemia, unspecified: Secondary | ICD-10-CM

## 2012-06-11 DIAGNOSIS — I259 Chronic ischemic heart disease, unspecified: Secondary | ICD-10-CM

## 2012-06-11 DIAGNOSIS — E039 Hypothyroidism, unspecified: Secondary | ICD-10-CM

## 2012-06-11 DIAGNOSIS — I2510 Atherosclerotic heart disease of native coronary artery without angina pectoris: Secondary | ICD-10-CM

## 2012-06-11 DIAGNOSIS — D509 Iron deficiency anemia, unspecified: Secondary | ICD-10-CM

## 2012-06-11 DIAGNOSIS — I4891 Unspecified atrial fibrillation: Secondary | ICD-10-CM

## 2012-06-11 MED ORDER — ATORVASTATIN CALCIUM 20 MG PO TABS: 20.0000 mg | ORAL_TABLET | Freq: Every day | ORAL | Status: AC

## 2012-06-11 MED ORDER — PANTOPRAZOLE SODIUM 40 MG PO TBEC: 40.0000 mg | DELAYED_RELEASE_TABLET | Freq: Every day | ORAL | Status: DC

## 2012-06-11 MED ORDER — FERROUS GLUCONATE 325 MG PO TABS: 325.0000 mg | ORAL_TABLET | Freq: Every day | ORAL | Status: DC

## 2012-06-11 MED ORDER — NITROGLYCERIN 0.4 MG SL SUBL: 0.4000 mg | SUBLINGUAL_TABLET | SUBLINGUAL | Status: AC | PRN

## 2012-06-11 MED ORDER — LEVOTHYROXINE SODIUM 50 MCG PO TABS: 50.0000 ug | ORAL_TABLET | Freq: Every day | ORAL | Status: DC

## 2012-06-11 MED ORDER — FERROUS GLUCONATE 325 MG PO TABS: 325.0000 mg | ORAL_TABLET | Freq: Every day | ORAL | Status: AC

## 2012-06-11 NOTE — Progress Notes (Signed)
Trevor [name] Date of Birth:  [birth year] [facility] [address] [place], [place]  [address] [phone]         Fax   [phone]  History of Present Illness: This pleasant 78 year old gentleman is seen for a post hospital followup office visit. He was discharged from the hospital on 05/19/12. He had been hospitalized since 04/23/12 with atrial fibrillation, acute on chronic systolic heart failure and ischemic cardiomyopathy. He also had acute respiratory failure and had to be on the ventilator briefly he has known COPD. He is not on Coumadin because of a history of falls. Since his last visit he is no longer in the skilled nursing facility.  He is back home.  He walks with a cane.  He is getting home health RN and home health physical therapy.  He has not been experiencing any recent new cardiac symptoms.  Current Outpatient Prescriptions  Medication Sig Dispense Refill  . acetaminophen (TYLENOL) 325 MG tablet Take 650 mg by mouth every 6 (six) hours as needed. For pain      . aspirin 81 MG tablet Take 1 tablet (81 mg total) by mouth daily.  30 tablet  6  . calcium carbonate (OS-CAL) 600 MG TABS Take 600 mg by mouth 2 (two) times daily with a meal.        . Cholecalciferol (VITAMIN D PO) Take 1,000 Int'l Units by mouth daily.      . clopidogrel (PLAVIX) 75 MG tablet Take 1 tablet (75 mg total) by mouth daily.  90 tablet  3  . docusate sodium (COLACE) 100 MG capsule Take 100 mg by mouth daily.      . feeding supplement (ENSURE) PUDG Take 1 Container by mouth 2 (two) times daily between meals.  60 Container  3  . ferrous gluconate (FERGON) 325 MG tablet Take 1 tablet (325 mg total) by mouth daily with breakfast.  90 tablet  3  . furosemide (LASIX) 40 MG tablet Take 1 tablet (40 mg total) by mouth 2 (two) times daily.  180 tablet  3  . levothyroxine (SYNTHROID, LEVOTHROID) 50 MCG tablet Take 1 tablet (50 mcg total) by mouth daily.  90 tablet  3  . losartan (COZAAR) 50  MG tablet Take 50 mg by mouth daily. Take 1/2 daily      . Multiple Vitamin (MULTIVITAMIN WITH MINERALS) TABS Take 1 tablet by mouth daily.      . nitroGLYCERIN (NITROSTAT) 0.4 MG SL tablet Place 1 tablet (0.4 mg total) under the tongue every 5 (five) minutes as needed. For chest pain  100 tablet  3  . polyethylene glycol (MIRALAX / GLYCOLAX) packet Take 17 g by mouth daily.      . potassium chloride SA (K-DUR,KLOR-CON) 20 MEQ tablet Take 1 tablet (20 mEq total) by mouth 2 (two) times daily.  180 tablet  3  . tiotropium (SPIRIVA) 18 MCG inhalation capsule Place 1 capsule (18 mcg total) into inhaler and inhale daily.  90 capsule  3  . DISCONTD: ferrous gluconate (FERGON) 325 MG tablet Take 325 mg by mouth daily with breakfast.      . DISCONTD: levothyroxine (SYNTHROID, LEVOTHROID) 50 MCG tablet Take 50 mcg by mouth daily.      [name] DISCONTD: nitroGLYCERIN (NITROSTAT) 0.4 MG SL tablet Place 0.4 mg under the tongue every 5 (five) minutes as needed. For chest pain      . atorvastatin (LIPITOR) 20 MG tablet Take 1 tablet (20 mg total) by  mouth daily.  90 tablet  3  . pantoprazole (PROTONIX) 40 MG tablet Take 1 tablet (40 mg total) by mouth at bedtime.  90 tablet  3  . DISCONTD: atorvastatin (LIPITOR) 20 MG tablet Take 20 mg by mouth daily.      [name] DISCONTD: furosemide (LASIX) 40 MG tablet Take 1 tablet (40 mg total) by mouth daily.  30 tablet  5  . DISCONTD: pantoprazole (PROTONIX) 40 MG tablet Take 1 tablet (40 mg total) by mouth at bedtime.  30 tablet  3    Allergies  Allergen Reactions  . Morphine And Related Anaphylaxis    Cardiac arrest  . Other Other (See Comments)    ALL SEDATIVES;  Lethargic, can hardly function, etc.  . Lisinopril Cough  . Vioxx (Rofecoxib) Other (See Comments)    Lethargic, can hardly function, very hard to wake up    Patient Active Problem List  Diagnosis  . Ischemic heart disease  . Hypercholesterolemia  . Iron deficiency anemia  . Hx of bladder cancer  .  Congestive heart failure with left ventricular systolic dysfunction  . Ischemic cardiomyopathy  . HTN (hypertension)  . COPD (chronic obstructive pulmonary disease)  . Lethargy  . Atrial fibrillation  . Respiratory failure with hypercapnia  . Acute respiratory failure with hypoxia  . Shock  . NSTEMI (non-ST elevated myocardial infarction)  . Hypotension  . Physical deconditioning    History  Smoking status  . Former Smoker -- 1.0 packs/day for 26 years  . Types: Cigarettes  . Quit date: 08/17/1983  Smokeless tobacco  . Former Neurosurgeon  . Types: Chew    History  Alcohol Use No    Family History  Problem Relation Age of Onset  . Heart disease Father   . Heart attack Father   . Hypertension Father   . Emphysema Brother   . Coronary artery disease Sister     X5 CABG still living  . Heart failure Sister   . Hypertension Mother     Review of Systems: Constitutional: no fever chills diaphoresis or fatigue or change in weight.  Head and neck: no hearing loss, no epistaxis, no photophobia or visual disturbance. Respiratory: No cough, shortness of breath or wheezing. Cardiovascular: No chest pain peripheral edema, palpitations. Gastrointestinal: No abdominal distention, no abdominal pain, no change in bowel habits hematochezia or melena. Genitourinary: No dysuria, no frequency, no urgency, no nocturia. Musculoskeletal:No arthralgias, no back pain, no gait disturbance or myalgias. Neurological: No dizziness, no headaches, no numbness, no seizures, no syncope, no weakness, no tremors. Hematologic: No lymphadenopathy, no easy bruising. Psychiatric: No confusion, no hallucinations, no sleep disturbance.    Physical Exam: Filed Vitals:   06/11/12 1035  BP: 120/60  Pulse: 80   the general appearance reveals an elderly gentleman in no distress.  He has marked kyphosis and shortening of his torso because of multiple compression fractures.Pupils equal and reactive.   Extraocular  Movements are full.  There is no scleral icterus.  The mouth and pharynx are normal.  The neck is supple.  The carotids reveal no bruits.  The jugular venous pressure is normal.  The thyroid is not enlarged.  There is no lymphadenopathy.  The chest is clear to percussion and auscultation. There are no rales or rhonchi. Expansion of the chest is symmetrical.  The precordium is quiet.  The first heart sound is normal.  The second heart sound is physiologically split.  There is no murmur gallop rub or click.  There is no abnormal lift or heave.  The abdomen is soft and nontender. Bowel sounds are normal. The liver and spleen are not enlarged. There Are no abdominal masses. There are no bruits.  Extremities show mild bilateral ankle edema. Strength is normal and symmetrical in all extremities.  There is no lateralizing weakness.  There are no sensory deficits.  The skin is warm and dry.  There is no rash.    Assessment / Plan: Continue same medication.  Recheck in 2 months for followup office visit EKG lipid panel hepatic function panel nasal metabolic panel free T4 TSH and CBC.  We're continuing his present dose of Synthroid which she is tolerating.  His TSHs have been borderline high but his T3 and his T4 have been slightly low and it is reasonable to continue the Synthroid.  We are continuing him on Protonix rather than Prevacid since he is also on Plavix

## 2012-06-11 NOTE — Assessment & Plan Note (Signed)
The patient has a history of paroxysmal atrial fibrillation.  LAD is in normal sinus rhythm clinically.  He is not on Coumadin because of a fall risk.  He is on aspirin and Plavix for his coronary disease.

## 2012-06-11 NOTE — Assessment & Plan Note (Signed)
The patient is on iron for iron deficiency anemia.  We will continue this and we will plan to recheck a CBC at his next visit in 2 months

## 2012-06-11 NOTE — Assessment & Plan Note (Signed)
Patient is not having any angina pectoris.  He is no longer wearing the patch and he has not had to take any sublingual nitroglycerin.

## 2012-06-11 NOTE — Telephone Encounter (Signed)
Will send Fergon to local pharmacy, not able to be filled by mail order.  Advised wife

## 2012-06-11 NOTE — Patient Instructions (Signed)
Your physician recommends that you schedule a follow-up appointment in: 2 month ov/lp/bmet/hfp/t4/tsh/cbc

## 2012-06-11 NOTE — Telephone Encounter (Signed)
New msg Pt's wife wants to discuss meds that was called in this morning

## 2012-06-12 ENCOUNTER — Telehealth: Payer: Self-pay | Admitting: Cardiology

## 2012-06-12 NOTE — Telephone Encounter (Signed)
[facility]  [phone]   Called to report FYI on patient on visit   OT plan of care 3 total visits for training in ADLS, adaptive equipment and HF management

## 2012-06-12 NOTE — Telephone Encounter (Signed)
Will forward to  Dr. Brackbill for review 

## 2012-06-27 ENCOUNTER — Ambulatory Visit: Payer: Medicare Other | Admitting: Cardiology

## 2012-07-04 ENCOUNTER — Other Ambulatory Visit: Payer: Self-pay | Admitting: *Deleted

## 2012-07-04 DIAGNOSIS — J449 Chronic obstructive pulmonary disease, unspecified: Secondary | ICD-10-CM

## 2012-07-04 DIAGNOSIS — I2510 Atherosclerotic heart disease of native coronary artery without angina pectoris: Secondary | ICD-10-CM

## 2012-07-04 DIAGNOSIS — E876 Hypokalemia: Secondary | ICD-10-CM

## 2012-07-04 MED ORDER — CLOPIDOGREL BISULFATE 75 MG PO TABS: 75.0000 mg | ORAL_TABLET | Freq: Every day | ORAL | Status: AC

## 2012-07-04 MED ORDER — POTASSIUM CHLORIDE CRYS ER 20 MEQ PO TBCR: 20.0000 meq | EXTENDED_RELEASE_TABLET | Freq: Two times a day (BID) | ORAL | Status: DC

## 2012-07-04 MED ORDER — TIOTROPIUM BROMIDE MONOHYDRATE 18 MCG IN CAPS: 18.0000 ug | ORAL_CAPSULE | Freq: Every day | RESPIRATORY_TRACT | Status: DC

## 2012-07-04 MED ORDER — POTASSIUM CHLORIDE CRYS ER 20 MEQ PO TBCR: 20.0000 meq | EXTENDED_RELEASE_TABLET | Freq: Two times a day (BID) | ORAL | Status: AC

## 2012-07-04 MED ORDER — TIOTROPIUM BROMIDE MONOHYDRATE 18 MCG IN CAPS: 18.0000 ug | ORAL_CAPSULE | Freq: Every day | RESPIRATORY_TRACT | Status: AC

## 2012-07-04 MED ORDER — CLOPIDOGREL BISULFATE 75 MG PO TABS: 75.0000 mg | ORAL_TABLET | Freq: Every day | ORAL | Status: DC

## 2012-07-24 ENCOUNTER — Telehealth: Payer: Self-pay | Admitting: Cardiology

## 2012-07-24 NOTE — Telephone Encounter (Signed)
No orders found as of today. Will check with dr brackbill's nurse to see if she has seen.

## 2012-07-24 NOTE — Telephone Encounter (Signed)
plz return call [facility] Nurse  [phone]  She has questions about orders that where sent over for this patient

## 2012-07-24 NOTE — Telephone Encounter (Signed)
Spoke with connie, she wants to know if we have received the orders for this pt and if they have been completed. Will look through dr brackbill's paperwork.

## 2012-07-29 NOTE — Telephone Encounter (Signed)
Orders should have been faxed back by medical records

## 2012-08-04 ENCOUNTER — Telehealth: Payer: Self-pay | Admitting: Cardiology

## 2012-08-04 NOTE — Telephone Encounter (Signed)
Did not take Friday, Saturday, and Sunday and seemed to sleep and swallow better. Red spots have also gotten better since stopping (this was listed as side effect).  She wants to keep him off of this but is not sure if that is ok and if something else should be Rx'd in place. Will forward to  Dr. Patty Sermons for review

## 2012-08-04 NOTE — Telephone Encounter (Signed)
Trevor [name] is having some problems and he has not taken spiriva Friday,Saturday or Sunday and he was having nose bleeds and red spots and rash on legs and feet trouble swallowing and now things are looking better but when he takes spiriva he finds it hard to swallow should we look at all other meds to see if they could be causing some of the problems as well

## 2012-08-04 NOTE — Telephone Encounter (Signed)
Advised patients wife not patient

## 2012-08-04 NOTE — Telephone Encounter (Signed)
Okay to leave off Spiriva and see if symptoms continue to improve.  We will not add any different medicine yet.

## 2012-08-04 NOTE — Telephone Encounter (Signed)
Left message to call back  

## 2012-08-04 NOTE — Telephone Encounter (Signed)
Advised patient

## 2012-08-06 ENCOUNTER — Other Ambulatory Visit (INDEPENDENT_AMBULATORY_CARE_PROVIDER_SITE_OTHER): Payer: Medicare Other

## 2012-08-06 DIAGNOSIS — E039 Hypothyroidism, unspecified: Secondary | ICD-10-CM

## 2012-08-06 DIAGNOSIS — E7800 Pure hypercholesterolemia, unspecified: Secondary | ICD-10-CM

## 2012-08-06 LAB — HEPATIC FUNCTION PANEL
ALT: 25 U/L (ref 0–53)
AST: 49 U/L — ABNORMAL HIGH (ref 0–37)
Albumin: 3.2 g/dL — ABNORMAL LOW (ref 3.5–5.2)
Alkaline Phosphatase: 78 U/L (ref 39–117)

## 2012-08-06 LAB — LIPID PANEL
Total CHOL/HDL Ratio: 2
VLDL: 11.2 mg/dL (ref 0.0–40.0)

## 2012-08-06 LAB — BASIC METABOLIC PANEL
BUN: 11 mg/dL (ref 6–23)
CO2: 31 mEq/L (ref 19–32)
Chloride: 96 mEq/L (ref 96–112)
Glucose, Bld: 110 mg/dL — ABNORMAL HIGH (ref 70–99)
Potassium: 4 mEq/L (ref 3.5–5.1)

## 2012-08-06 LAB — TSH: TSH: 4.6 u[IU]/mL (ref 0.35–5.50)

## 2012-08-06 LAB — CBC WITH DIFFERENTIAL/PLATELET
Basophils Relative: 0.2 % (ref 0.0–3.0)
Eosinophils Relative: 0.5 % (ref 0.0–5.0)
Lymphocytes Relative: 36.2 % (ref 12.0–46.0)
MCV: 79.3 fl (ref 78.0–100.0)
Monocytes Relative: 7.5 % (ref 3.0–12.0)
Neutrophils Relative %: 55.6 % (ref 43.0–77.0)
RBC: 4.67 Mil/uL (ref 4.22–5.81)
WBC: 6 10*3/uL (ref 4.5–10.5)

## 2012-08-06 NOTE — Progress Notes (Signed)
Quick Note:  Please make copy of labs for patient visit. ______ 

## 2012-08-11 ENCOUNTER — Encounter: Payer: Self-pay | Admitting: Cardiology

## 2012-08-11 ENCOUNTER — Ambulatory Visit (INDEPENDENT_AMBULATORY_CARE_PROVIDER_SITE_OTHER): Payer: Medicare Other | Admitting: Cardiology

## 2012-08-11 ENCOUNTER — Ambulatory Visit (INDEPENDENT_AMBULATORY_CARE_PROVIDER_SITE_OTHER)
Admission: RE | Admit: 2012-08-11 | Discharge: 2012-08-11 | Disposition: A | Discharge status: Home / Self Care | Payer: Medicare Other | Source: Ambulatory Visit | Attending: Cardiology | Admitting: Cardiology

## 2012-08-11 VITALS — BP 136/79 | HR 81 | Ht 59.0 in | Wt 154.8 lb

## 2012-08-11 DIAGNOSIS — J449 Chronic obstructive pulmonary disease, unspecified: Secondary | ICD-10-CM

## 2012-08-11 DIAGNOSIS — I509 Heart failure, unspecified: Secondary | ICD-10-CM

## 2012-08-11 DIAGNOSIS — R6889 Other general symptoms and signs: Secondary | ICD-10-CM

## 2012-08-11 DIAGNOSIS — I5020 Unspecified systolic (congestive) heart failure: Secondary | ICD-10-CM

## 2012-08-11 DIAGNOSIS — R0601 Orthopnea: Secondary | ICD-10-CM

## 2012-08-11 DIAGNOSIS — R899 Unspecified abnormal finding in specimens from other organs, systems and tissues: Secondary | ICD-10-CM

## 2012-08-11 DIAGNOSIS — I4891 Unspecified atrial fibrillation: Secondary | ICD-10-CM

## 2012-08-11 DIAGNOSIS — R609 Edema, unspecified: Secondary | ICD-10-CM

## 2012-08-11 DIAGNOSIS — R0602 Shortness of breath: Secondary | ICD-10-CM

## 2012-08-11 NOTE — Patient Instructions (Signed)
Decrease your Ensure to 1 daily  Decrease your Asprin 81 mg to every other day  Increase your Lasix 80 mg in the morning and 40 mg in the evening  Will have you go for a chest xray  Your physician recommends that you schedule a follow-up appointment in: 1 month ov/bmet/bnp with Lawson Fiscal or  Dr. Patty Sermons

## 2012-08-12 ENCOUNTER — Telehealth: Payer: Self-pay | Admitting: *Deleted

## 2012-08-12 NOTE — Telephone Encounter (Signed)
Advised patient and wife of chest xray results

## 2012-08-12 NOTE — Assessment & Plan Note (Signed)
The patient has had no further atrial fibrillation.  EKG today confirms normal sinus rhythm with first degree block.

## 2012-08-12 NOTE — Telephone Encounter (Signed)
Message copied by Burnell Blanks on Tue Aug 12, 2012  1:59 PM ------      Message from: Cassell Clement      Created: Tue Aug 12, 2012 12:08 PM       Please report.  Chest xray was stable and did not show any increased fluid. Heart still enlarged. Continue present meds.

## 2012-08-12 NOTE — Assessment & Plan Note (Signed)
The patient appears to be having more symptoms of congestive heart failure with orthopnea and increasing ankle edema despite the fact that his overall weight is down 4 pounds.  He has been eating fair and has been taking Ensure twice a day.  His recent serum albumin is only slightly low at 3.2.

## 2012-08-12 NOTE — Progress Notes (Signed)
Trevor [name] Date of Birth:  11-Jan-1925 [facility] [address] [place], [place]  [address] [phone]         Fax   [phone]  History of Present Illness: This pleasant 78 year old gentleman is seen for a scheduled followup office visit. He was discharged from the hospital on 05/19/12. He had been hospitalized since 04/23/12 with paroxysmal atrial fibrillation, acute on chronic systolic heart failure and ischemic cardiomyopathy. He also had acute respiratory failure and had to be on the ventilator briefly.   He has known COPD. He is not on Coumadin because of a history of falls.  [name] He is back home.  His wife is his caregiver. He walks with a cane. . He has not been experiencing any recent chest pain.  He has lost 4 pounds since last visit.  He is having difficulty sleeping flat.  He has to sleep in a recliner.  He appears to be having orthopnea.  His throat has been bothering him and he stopped Spiriva for a while but it did not make a difference and so he is back on Spiriva.  He has had more peripheral edema.  He has also been experiencing easy bruising and has been on Plavix and aspirin.   Current Outpatient Prescriptions  Medication Sig Dispense Refill  . acetaminophen (TYLENOL) 325 MG tablet Take 650 mg by mouth every 6 (six) hours as needed. For pain      . aspirin EC 81 MG tablet Take 81 mg by mouth as directed. Every other day      . atorvastatin (LIPITOR) 20 MG tablet Take 1 tablet (20 mg total) by mouth daily.  90 tablet  3  . calcium carbonate (OS-CAL) 600 MG TABS Take 600 mg by mouth 2 (two) times daily with a meal.        . Cholecalciferol (VITAMIN D PO) Take 1,000 Int'l Units by mouth daily.      . clopidogrel (PLAVIX) 75 MG tablet Take 1 tablet (75 mg total) by mouth daily.  90 tablet  3  . docusate sodium (COLACE) 100 MG capsule Take 100 mg by mouth daily.      . feeding supplement (ENSURE) PUDG Take 1 Container by mouth daily.      . ferrous  gluconate (FERGON) 325 MG tablet Take 1 tablet (325 mg total) by mouth daily with breakfast.  90 tablet  3  . furosemide (LASIX) 40 MG tablet Take 40 mg by mouth as directed. 2 in the morning and 1 in the evening      . losartan (COZAAR) 50 MG tablet Take 50 mg by mouth daily. Take 1/2 daily      . Multiple Vitamin (MULTIVITAMIN WITH MINERALS) TABS Take 1 tablet by mouth daily.      . nitroGLYCERIN (NITROSTAT) 0.4 MG SL tablet Place 1 tablet (0.4 mg total) under the tongue every 5 (five) minutes as needed. For chest pain  100 tablet  3  . polyethylene glycol (MIRALAX / GLYCOLAX) packet Take 17 g by mouth daily.      . potassium chloride SA (K-DUR,KLOR-CON) 20 MEQ tablet Take 1 tablet (20 mEq total) by mouth 2 (two) times daily.  180 tablet  3  . tiotropium (SPIRIVA) 18 MCG inhalation capsule Place 1 capsule (18 mcg total) into inhaler and inhale daily.  90 capsule  3    Allergies  Allergen Reactions  . Morphine And Related Anaphylaxis    Cardiac arrest  . Other  Other (See Comments)    ALL SEDATIVES;  Lethargic, can hardly function, etc.  . Lisinopril Cough  . Vioxx (Rofecoxib) Other (See Comments)    Lethargic, can hardly function, very hard to wake up    Patient Active Problem List  Diagnosis  . Ischemic heart disease  . Hypercholesterolemia  . Iron deficiency anemia  . Hx of bladder cancer  . Congestive heart failure with left ventricular systolic dysfunction  . Ischemic cardiomyopathy  . HTN (hypertension)  . COPD (chronic obstructive pulmonary disease)  . Lethargy  . Atrial fibrillation  . Respiratory failure with hypercapnia  . Acute respiratory failure with hypoxia  . Shock  . NSTEMI (non-ST elevated myocardial infarction)  . Hypotension  . Physical deconditioning    History  Smoking status  . Former Smoker -- 1.0 packs/day for 26 years  . Types: Cigarettes  . Quit date: 08/17/1983  Smokeless tobacco  . Former Neurosurgeon  . Types: Chew    History  Alcohol Use No     Family History  Problem Relation Age of Onset  . Heart disease Father   . Heart attack Father   . Hypertension Father   . Emphysema Brother   . Coronary artery disease Sister     X5 CABG still living  . Heart failure Sister   . Hypertension Mother     Review of Systems: Constitutional: no fever chills diaphoresis or fatigue or change in weight.  Head and neck: no hearing loss, no epistaxis, no photophobia or visual disturbance. Respiratory: No cough, shortness of breath or wheezing. Cardiovascular: No chest pain peripheral edema, palpitations. Gastrointestinal: No abdominal distention, no abdominal pain, no change in bowel habits hematochezia or melena. Genitourinary: No dysuria, no frequency, no urgency, no nocturia. Musculoskeletal:No arthralgias, no back pain, no gait disturbance or myalgias. Neurological: No dizziness, no headaches, no numbness, no seizures, no syncope, no weakness, no tremors. Hematologic: No lymphadenopathy, no easy bruising. Psychiatric: No confusion, no hallucinations, no sleep disturbance.    Physical Exam: Filed Vitals:   08/11/12 1201  BP: 136/79  Pulse: 81   the general appearance reveals a short elderly gentleman with marked kyphosis.Pupils equal and reactive.   Extraocular Movements are full.  There is no scleral icterus.  The mouth and pharynx are normal.  The neck is supple.  The carotids reveal no bruits.  The jugular venous pressure is normal.  The thyroid is not enlarged.  There is no lymphadenopathy.  Chest reveals mild expiratory wheezing and he does have bibasilar inspiratory rales. Heart reveals no murmur gallop rub or click.  Abdomen is soft and nontender without hepatosplenomegaly or masses Extremities show 2+ ankle edema. Strength is normal and symmetrical in all extremities.  There is no lateralizing weakness.  There are no sensory deficits.  Integument reveals multiple ecchymotic areas and he denies trauma.  EKG today shows  normal sinus rhythm at 73 per minute with first degree AV block, right bundle branch block, and old inferolateral myocardial infarction   Assessment / Plan: We are going to get a chest x-ray.  We will empirically increase his furosemide to 80 mg in the morning and 40 mg in the evening. Because of easy bruising we will reduce his aspirin 81 mg to just every other day. He'll be rechecked for followup office visit the natruretic peptide and basal metabolic panel in one month, or sooner when necessary if he fails to respond.

## 2012-08-12 NOTE — Assessment & Plan Note (Signed)
He complains of phlegm in his throat.  He does not appear to be having any acute respiratory distress

## 2012-08-26 ENCOUNTER — Telehealth: Payer: Self-pay | Admitting: Cardiology

## 2012-08-26 NOTE — Telephone Encounter (Signed)
New Problem:    Called in wanting to speak to you about a face-to-face request that was faxed over on 07/17/12.  Please call back.

## 2012-08-27 NOTE — Telephone Encounter (Signed)
Left message to refax 

## 2012-09-12 ENCOUNTER — Ambulatory Visit (INDEPENDENT_AMBULATORY_CARE_PROVIDER_SITE_OTHER): Payer: Medicare Other | Admitting: Cardiology

## 2012-09-12 ENCOUNTER — Encounter: Payer: Self-pay | Admitting: Cardiology

## 2012-09-12 ENCOUNTER — Other Ambulatory Visit (INDEPENDENT_AMBULATORY_CARE_PROVIDER_SITE_OTHER): Payer: Medicare Other

## 2012-09-12 VITALS — BP 123/71 | HR 71 | Resp 18 | Ht 59.0 in | Wt 154.0 lb

## 2012-09-12 DIAGNOSIS — I509 Heart failure, unspecified: Secondary | ICD-10-CM

## 2012-09-12 DIAGNOSIS — R0602 Shortness of breath: Secondary | ICD-10-CM

## 2012-09-12 DIAGNOSIS — I259 Chronic ischemic heart disease, unspecified: Secondary | ICD-10-CM

## 2012-09-12 DIAGNOSIS — I255 Ischemic cardiomyopathy: Secondary | ICD-10-CM

## 2012-09-12 DIAGNOSIS — Z79899 Other long term (current) drug therapy: Secondary | ICD-10-CM

## 2012-09-12 DIAGNOSIS — R609 Edema, unspecified: Secondary | ICD-10-CM

## 2012-09-12 DIAGNOSIS — I5020 Unspecified systolic (congestive) heart failure: Secondary | ICD-10-CM

## 2012-09-12 DIAGNOSIS — I2589 Other forms of chronic ischemic heart disease: Secondary | ICD-10-CM

## 2012-09-12 LAB — BASIC METABOLIC PANEL
Calcium: 8.9 mg/dL (ref 8.4–10.5)
Creatinine, Ser: 0.8 mg/dL (ref 0.4–1.5)
GFR: 96.27 mL/min (ref >60.00)
Sodium: 132 mEq/L — ABNORMAL LOW (ref 135–145)

## 2012-09-12 MED ORDER — FUROSEMIDE 40 MG PO TABS: 40.0000 mg | ORAL_TABLET | ORAL | Status: DC

## 2012-09-12 NOTE — Assessment & Plan Note (Signed)
The patient remains in compensated congestive heart failure.  He is not having acute dyspnea.  He does not require home oxygen.  He sleeps in a lounge chair rather than in the bed.  Some of this has to do with the fact that he has marked kyphosis from his multiple compression fractures secondary to osteopenia

## 2012-09-12 NOTE — Patient Instructions (Addendum)
Your physician recommends that you continue on your current medications as directed. Please refer to the Current Medication list given to you today.  Your physician recommends that you schedule a follow-up appointment in: 2 month ov/cbc/bmet

## 2012-09-12 NOTE — Assessment & Plan Note (Signed)
The patient takes only an occasional sublingual nitroglycerin.

## 2012-09-12 NOTE — Progress Notes (Signed)
Trevor [name] Date of Birth:  10-Mar-1925 [facility] [address] [place], [place]  [address] [phone]         Fax   [phone]  History of Present Illness: This pleasant 78 year old gentleman is seen for a scheduled followup office visit. He was discharged from the hospital on 05/19/12. He had been hospitalized since 04/23/12 with paroxysmal atrial fibrillation, acute on chronic systolic heart failure and ischemic cardiomyopathy. He also had acute respiratory failure and had to be on the ventilator briefly. He has known COPD. He is not on Coumadin because of a history of falls.  [name] He is back home. His wife is his caregiver. He walks with a cane. . He has not been experiencing any recent chest pain.  He is having difficulty sleeping flat. He has to sleep in a recliner. He appears to be having orthopnea. His throat has been bothering him and he stopped Spiriva for a while but it did not make a difference and so he is back on Spiriva.  He has also been experiencing easy bruising and has been on Plavix and aspirin.  His weight is unchanged since last visit.   Current Outpatient Prescriptions  Medication Sig Dispense Refill  . acetaminophen (TYLENOL) 325 MG tablet Take 650 mg by mouth every 6 (six) hours as needed. For pain      . aspirin EC 81 MG tablet Take 81 mg by mouth as directed. Every other day      . atorvastatin (LIPITOR) 20 MG tablet Take 1 tablet (20 mg total) by mouth daily.  90 tablet  3  . calcium carbonate (OS-CAL) 600 MG TABS Take 600 mg by mouth 2 (two) times daily with a meal.        . Cholecalciferol (VITAMIN D PO) Take 1,000 Int'l Units by mouth daily.      . clopidogrel (PLAVIX) 75 MG tablet Take 1 tablet (75 mg total) by mouth daily.  90 tablet  3  . docusate sodium (COLACE) 100 MG capsule Take 100 mg by mouth daily.      . feeding supplement (ENSURE) PUDG Take 1 Container by mouth daily.      . ferrous gluconate (FERGON) 325 MG tablet Take 1  tablet (325 mg total) by mouth daily with breakfast.  90 tablet  3  . furosemide (LASIX) 40 MG tablet Take 1 tablet (40 mg total) by mouth as directed. 2 in the morning and 1 in the evening  270 tablet  3  . losartan (COZAAR) 50 MG tablet Take 50 mg by mouth daily. Take 1/2 daily      . Multiple Vitamin (MULTIVITAMIN WITH MINERALS) TABS Take 1 tablet by mouth daily.      . nitroGLYCERIN (NITROSTAT) 0.4 MG SL tablet Place 1 tablet (0.4 mg total) under the tongue every 5 (five) minutes as needed. For chest pain  100 tablet  3  . polyethylene glycol (MIRALAX / GLYCOLAX) packet Take 17 g by mouth daily.      . potassium chloride SA (K-DUR,KLOR-CON) 20 MEQ tablet Take 1 tablet (20 mEq total) by mouth 2 (two) times daily.  180 tablet  3  . tiotropium (SPIRIVA) 18 MCG inhalation capsule Place 1 capsule (18 mcg total) into inhaler and inhale daily.  90 capsule  3  . [DISCONTINUED] furosemide (LASIX) 40 MG tablet Take 40 mg by mouth as directed. 2 in the morning and 1 in the evening  Allergies  Allergen Reactions  . Morphine And Related Anaphylaxis    Cardiac arrest  . Other Other (See Comments)    ALL SEDATIVES;  Lethargic, can hardly function, etc.  . Lisinopril Cough  . Vioxx (Rofecoxib) Other (See Comments)    Lethargic, can hardly function, very hard to wake up    Patient Active Problem List  Diagnosis  . Ischemic heart disease  . Hypercholesterolemia  . Iron deficiency anemia  . Hx of bladder cancer  . Congestive heart failure with left ventricular systolic dysfunction  . Ischemic cardiomyopathy  . HTN (hypertension)  . COPD (chronic obstructive pulmonary disease)  . Lethargy  . Atrial fibrillation  . Respiratory failure with hypercapnia  . Acute respiratory failure with hypoxia  . Shock  . NSTEMI (non-ST elevated myocardial infarction)  . Hypotension  . Physical deconditioning    History  Smoking status  . Former Smoker -- 1.0 packs/day for 26 years  . Types:  Cigarettes  . Quit date: 08/17/1983  Smokeless tobacco  . Former Neurosurgeon  . Types: Chew    History  Alcohol Use No    Family History  Problem Relation Age of Onset  . Heart disease Father   . Heart attack Father   . Hypertension Father   . Emphysema Brother   . Coronary artery disease Sister     X5 CABG still living  . Heart failure Sister   . Hypertension Mother     Review of Systems: Constitutional: no fever chills diaphoresis or fatigue or change in weight.  Head and neck: no hearing loss, no epistaxis, no photophobia or visual disturbance. Respiratory: No cough, shortness of breath or wheezing. Cardiovascular: No chest pain peripheral edema, palpitations. Gastrointestinal: No abdominal distention, no abdominal pain, no change in bowel habits hematochezia or melena. Genitourinary: No dysuria, no frequency, no urgency, no nocturia. Musculoskeletal:No arthralgias, no back pain, no gait disturbance or myalgias. Neurological: No dizziness, no headaches, no numbness, no seizures, no syncope, no weakness, no tremors. Hematologic: No lymphadenopathy, no easy bruising. Psychiatric: No confusion, no hallucinations, no sleep disturbance.    Physical Exam: Filed Vitals:   09/12/12 1113  BP: 123/71  Pulse: 71  Resp: 18   the general appearance reveals an elderly gentleman in no acute distress.  His weight is unchanged.Pupils equal and reactive.   Extraocular Movements are full.  There is no scleral icterus.  The mouth and pharynx are normal.  The neck is supple.  The carotids reveal no bruits.  The jugular venous pressure is normal.  The thyroid is not enlarged.  There is no lymphadenopathy.  Chest reveals marked kyphosis.  The lungs are essentially clear today.  The heart reveals grade 2/6 systolic ejection murmur at the base and apex.  No diastolic murmur. Abdomen is soft and nontender.  Extremities reveal mild bilateral edema worse on the left. Neurologic reveals that he is  experiencing what sounds like sciatica on his left lower extremity.The skin is warm and dry.  There is no rash.  Assessment / Plan: We are checking lab work today.  The patient is to continue same medication.  He is having less bruising since we cut back on his aspirin to just every other day.  He'll return in 2 months for a followup office visit metabolic panel and CBC.  We should also get an EKG next time.

## 2012-09-15 ENCOUNTER — Telehealth: Payer: Self-pay | Admitting: *Deleted

## 2012-09-15 NOTE — Telephone Encounter (Signed)
Advised wife of labs 

## 2012-09-15 NOTE — Telephone Encounter (Signed)
Message copied by Burnell Blanks on Mon Sep 15, 2012  6:24 PM ------      Message from: Cassell Clement      Created: Sun Sep 14, 2012  5:56 PM       Blood work is improved. BNP much better. CSD.  BS higher watch sweets.

## 2012-11-17 ENCOUNTER — Ambulatory Visit (INDEPENDENT_AMBULATORY_CARE_PROVIDER_SITE_OTHER): Payer: Medicare Other | Admitting: Cardiology

## 2012-11-17 ENCOUNTER — Other Ambulatory Visit (INDEPENDENT_AMBULATORY_CARE_PROVIDER_SITE_OTHER): Payer: Medicare Other

## 2012-11-17 ENCOUNTER — Encounter: Payer: Self-pay | Admitting: Cardiology

## 2012-11-17 VITALS — BP 120/68 | HR 62 | Resp 18 | Ht 59.0 in | Wt 154.1 lb

## 2012-11-17 DIAGNOSIS — R0602 Shortness of breath: Secondary | ICD-10-CM

## 2012-11-17 DIAGNOSIS — I509 Heart failure, unspecified: Secondary | ICD-10-CM

## 2012-11-17 DIAGNOSIS — Z79899 Other long term (current) drug therapy: Secondary | ICD-10-CM

## 2012-11-17 DIAGNOSIS — I4891 Unspecified atrial fibrillation: Secondary | ICD-10-CM

## 2012-11-17 DIAGNOSIS — M1990 Unspecified osteoarthritis, unspecified site: Secondary | ICD-10-CM | POA: Insufficient documentation

## 2012-11-17 DIAGNOSIS — I259 Chronic ischemic heart disease, unspecified: Secondary | ICD-10-CM

## 2012-11-17 DIAGNOSIS — I10 Essential (primary) hypertension: Secondary | ICD-10-CM

## 2012-11-17 DIAGNOSIS — I5020 Unspecified systolic (congestive) heart failure: Secondary | ICD-10-CM

## 2012-11-17 LAB — CBC WITH DIFFERENTIAL/PLATELET
Basophils Relative: 0.8 % (ref 0.0–3.0)
Eosinophils Absolute: 0 10*3/uL (ref 0.0–0.7)
Eosinophils Relative: 0.7 % (ref 0.0–5.0)
Lymphocytes Relative: 32.8 % (ref 12.0–46.0)
MCHC: 31.8 g/dL (ref 30.0–36.0)
Neutrophils Relative %: 56.7 % (ref 43.0–77.0)
Platelets: 162 10*3/uL (ref 150.0–400.0)
RBC: 4.74 Mil/uL (ref 4.22–5.81)
WBC: 5.7 10*3/uL (ref 4.5–10.5)

## 2012-11-17 LAB — BASIC METABOLIC PANEL
CO2: 31 mEq/L (ref 19–32)
Calcium: 9.4 mg/dL (ref 8.4–10.5)
Sodium: 136 mEq/L (ref 135–145)

## 2012-11-17 NOTE — Patient Instructions (Addendum)
Will obtain labs today and call you with the results (cbc/bmet)  Your physician recommends that you continue on your current medications as directed. Please refer to the Current Medication list given to you today.  Your physician recommends that you schedule a follow-up appointment in: 3 months with fasting labs (LP/BMET/HFP)

## 2012-11-17 NOTE — Progress Notes (Signed)
Quick Note:  Please report to patient. The recent labs are stable. Continue same medication and careful diet. Hbg better. BS better. ______

## 2012-11-17 NOTE — Assessment & Plan Note (Signed)
EKG today shows that he is back in normal sinus rhythm with first degree AV block.  He has a history of paroxysmal atrial fibrillation but is not on Coumadin.  He is a fall risk.  He does remain on dual antiplatelet therapy because of his ischemic heart disease.

## 2012-11-17 NOTE — Assessment & Plan Note (Signed)
Blood pressure is normal on current therapy

## 2012-11-17 NOTE — Assessment & Plan Note (Signed)
The patient has a history of osteoarthritis and osteoporosis and previous compression fractures with significant kyphosis of the spine.  He was having less back pain and previously and his ambulation has improved

## 2012-11-17 NOTE — Progress Notes (Signed)
Trevor [name] Date of Birth:  21-Mar-1925 [facility] [address] [place], [place]  [address] [phone]         Fax   [phone]  History of Present Illness: This pleasant 79 year old gentleman is seen for a scheduled followup office visit. He was discharged from the hospital on 05/19/12. He had been hospitalized since 04/23/12 with paroxysmal atrial fibrillation, acute on chronic systolic heart failure and ischemic cardiomyopathy. He also had acute respiratory failure and had to be on the ventilator briefly. He has known COPD. he has a past history of paroxysmal atrial fibrillation. He is not on Coumadin because of a history of falls.  [name] He is back home. His wife is his caregiver. He walks with a cane. . He has not been experiencing any recent chest pain. He is having difficulty sleeping flat. He has to sleep in a recliner. He appears to be having orthopnea. His throat has been bothering him and he stopped Spiriva for a while but it did not make a difference and so he is back on Spiriva. He has also been experiencing easy bruising and has been on Plavix and aspirin. His weight is unchanged since last visit.  His appetite remains good.  He has not been having any chest pain or angina   Current Outpatient Prescriptions  Medication Sig Dispense Refill  . acetaminophen (TYLENOL) 325 MG tablet Take 650 mg by mouth every 6 (six) hours as needed. For pain      . aspirin EC 81 MG tablet Take 81 mg by mouth as directed. Every other day      . atorvastatin (LIPITOR) 20 MG tablet Take 1 tablet (20 mg total) by mouth daily.  90 tablet  3  . calcium carbonate (OS-CAL) 600 MG TABS Take 600 mg by mouth 2 (two) times daily with a meal.        . Cholecalciferol (VITAMIN D PO) Take 1,000 Int'l Units by mouth daily.      . clopidogrel (PLAVIX) 75 MG tablet Take 1 tablet (75 mg total) by mouth daily.  90 tablet  3  . docusate sodium (COLACE) 100 MG capsule Take 100 mg by mouth daily.       . feeding supplement (ENSURE) PUDG Take 1 Container by mouth daily.      . ferrous gluconate (FERGON) 325 MG tablet Take 1 tablet (325 mg total) by mouth daily with breakfast.  90 tablet  3  . furosemide (LASIX) 40 MG tablet Take 1 tablet (40 mg total) by mouth as directed. 2 in the morning and 1 in the evening  270 tablet  3  . losartan (COZAAR) 50 MG tablet Take 50 mg by mouth daily. Take 1/2 daily      . Multiple Vitamin (MULTIVITAMIN WITH MINERALS) TABS Take 1 tablet by mouth daily.      . nitroGLYCERIN (NITROSTAT) 0.4 MG SL tablet Place 1 tablet (0.4 mg total) under the tongue every 5 (five) minutes as needed. For chest pain  100 tablet  3  . polyethylene glycol (MIRALAX / GLYCOLAX) packet Take 17 g by mouth daily.      . potassium chloride SA (K-DUR,KLOR-CON) 20 MEQ tablet Take 1 tablet (20 mEq total) by mouth 2 (two) times daily.  180 tablet  3  . tiotropium (SPIRIVA) 18 MCG inhalation capsule Place 1 capsule (18 mcg total) into inhaler and inhale daily.  90 capsule  3    Allergies  Allergen Reactions  .  Morphine And Related Anaphylaxis    Cardiac arrest  . Other Other (See Comments)    ALL SEDATIVES;  Lethargic, can hardly function, etc.  . Lisinopril Cough  . Vioxx (Rofecoxib) Other (See Comments)    Lethargic, can hardly function, very hard to wake up    Patient Active Problem List  Diagnosis  . Ischemic heart disease  . Hypercholesterolemia  . Iron deficiency anemia  . Hx of bladder cancer  . Congestive heart failure with left ventricular systolic dysfunction  . Ischemic cardiomyopathy  . HTN (hypertension)  . COPD (chronic obstructive pulmonary disease)  . Lethargy  . Atrial fibrillation  . Respiratory failure with hypercapnia  . Acute respiratory failure with hypoxia  . Shock  . NSTEMI (non-ST elevated myocardial infarction)  . Hypotension  . Physical deconditioning    History  Smoking status  . Former Smoker -- 1.0 packs/day for 26 years  . Types:  Cigarettes  . Quit date: 08/17/1983  Smokeless tobacco  . Former Neurosurgeon  . Types: Chew    History  Alcohol Use No    Family History  Problem Relation Age of Onset  . Heart disease Father   . Heart attack Father   . Hypertension Father   . Emphysema Brother   . Coronary artery disease Sister     X5 CABG still living  . Heart failure Sister   . Hypertension Mother     Review of Systems: Constitutional: no fever chills diaphoresis or fatigue or change in weight.  Head and neck: no hearing loss, no epistaxis, no photophobia or visual disturbance. Respiratory: No cough, shortness of breath or wheezing. Cardiovascular: No chest pain peripheral edema, palpitations. Gastrointestinal: No abdominal distention, no abdominal pain, no change in bowel habits hematochezia or melena. Genitourinary: No dysuria, no frequency, no urgency, no nocturia. Musculoskeletal:No arthralgias, no back pain, no gait disturbance or myalgias. Neurological: No dizziness, no headaches, no numbness, no seizures, no syncope, no weakness, no tremors. Hematologic: No lymphadenopathy, no easy bruising. Psychiatric: No confusion, no hallucinations, no sleep disturbance.    Physical Exam: Filed Vitals:   11/17/12 0908  BP: 120/68  Pulse: 62  Resp: 18   the general appearance reveals a short gentleman in no acute distress.  He has marked kyphosis related to previous compression fractures.  He has increased AP diameter of his chest.The head and neck exam reveals pupils equal and reactive.  Extraocular movements are full.  There is no scleral icterus.  The mouth and pharynx are normal.  The neck is supple.  The carotids reveal no bruits.  The jugular venous pressure is normal.  The  thyroid is not enlarged.  There is no lymphadenopathy.  The chest is clear to percussion and auscultation.  There are no rales or rhonchi.  Expansion of the chest is symmetrical.  The precordium is quiet.  The first heart sound is normal.   The second heart sound is physiologically split.  There is no  gallop rub or click.  There is a 2/6 systolic murmur at the base and left sternal edge  There is no abnormal lift or heave.  The abdomen is soft and nontender.  The bowel sounds are normal.  The liver and spleen are not enlarged.  There are no abdominal masses.  There are no abdominal bruits.  Extremities reveal good pedal pulses.  There is mild edema of his left lower leg and foot chronically. There is no cyanosis or clubbing.  Strength is normal and symmetrical  in all extremities.  There is no lateralizing weakness.  There are no sensory deficits.  The skin is warm and dry.  There is no rash.  EKG today shows normal sinus rhythm with first degree AV block and pattern of an old inferior wall myocardial infarction and a pattern of possible right ventricular hypertrophy.  No acute ischemic changes  Assessment / Plan: The patient overall appears to be stable and doing a little better than last time.  He is ambulating better.  We are checking CBC and basal metabolic panel today.  Recheck in 3 months for followup office visit lipid panel hepatic function panel and basal metabolic panel.  Stay on same medication

## 2012-11-17 NOTE — Assessment & Plan Note (Signed)
The patient has been stable in terms of his congestive heart failure with no acute dyspnea.  He remains on Lasix and ARB

## 2012-11-18 ENCOUNTER — Telehealth: Payer: Self-pay | Admitting: *Deleted

## 2012-11-18 NOTE — Telephone Encounter (Signed)
Advised wife of labs 

## 2012-11-18 NOTE — Telephone Encounter (Signed)
Message copied by Burnell Blanks on Tue Nov 18, 2012  1:57 PM ------      Message from: Cassell Clement      Created: Mon Nov 17, 2012  8:44 PM       Please report to patient.  The recent labs are stable. Continue same medication and careful diet. Hbg better. BS better.

## 2013-02-13 ENCOUNTER — Telehealth: Payer: Self-pay | Admitting: Cardiology

## 2013-02-13 NOTE — Telephone Encounter (Signed)
Follow up ° ° °Please call pt. °

## 2013-02-13 NOTE — Telephone Encounter (Signed)
Patient weight up 8-10 pounds in the last week. Lasix 40 mg 2 in the am and 1 in the pm. Having swelling in lower extremities. Can barely bend knee so swollen. Leg is red and draining. Patient is keeping elevated.

## 2013-02-13 NOTE — Telephone Encounter (Signed)
New problem    Per pts wife pt has weight gain 8-10 lbs

## 2013-02-13 NOTE — Telephone Encounter (Signed)
Discussed with Dawayne Patricia. NP and will have patient increase Lasix to 40 mg 2 twice a day. Places are oozing and leg is red, wife denies streaks. Advised wife at 1:00 pm to call and have patient take 40 mg then and if legs look worse when she gets home to take to urgent care or if they get worse over weekend, urgent care. Will follow up Monday with wife

## 2013-02-15 ENCOUNTER — Ambulatory Visit (INDEPENDENT_AMBULATORY_CARE_PROVIDER_SITE_OTHER): Payer: Medicare Other | Admitting: Emergency Medicine

## 2013-02-15 ENCOUNTER — Emergency Department (HOSPITAL_COMMUNITY): Payer: Medicare Other

## 2013-02-15 ENCOUNTER — Inpatient Hospital Stay (HOSPITAL_COMMUNITY)
Admission: EM | Admit: 2013-02-15 | Discharge: 2013-03-22 | DRG: 314 | Disposition: E | Payer: Medicare Other | Attending: Internal Medicine | Admitting: Internal Medicine

## 2013-02-15 ENCOUNTER — Encounter (HOSPITAL_COMMUNITY): Payer: Self-pay | Admitting: Emergency Medicine

## 2013-02-15 VITALS — BP 96/60 | HR 89 | Temp 97.8°F | Resp 30 | Ht 60.0 in | Wt 165.0 lb

## 2013-02-15 DIAGNOSIS — Z7901 Long term (current) use of anticoagulants: Secondary | ICD-10-CM

## 2013-02-15 DIAGNOSIS — I509 Heart failure, unspecified: Secondary | ICD-10-CM

## 2013-02-15 DIAGNOSIS — I359 Nonrheumatic aortic valve disorder, unspecified: Secondary | ICD-10-CM | POA: Diagnosis present

## 2013-02-15 DIAGNOSIS — E871 Hypo-osmolality and hyponatremia: Secondary | ICD-10-CM

## 2013-02-15 DIAGNOSIS — N289 Disorder of kidney and ureter, unspecified: Secondary | ICD-10-CM

## 2013-02-15 DIAGNOSIS — K761 Chronic passive congestion of liver: Secondary | ICD-10-CM

## 2013-02-15 DIAGNOSIS — Z66 Do not resuscitate: Secondary | ICD-10-CM | POA: Diagnosis present

## 2013-02-15 DIAGNOSIS — D638 Anemia in other chronic diseases classified elsewhere: Secondary | ICD-10-CM | POA: Diagnosis present

## 2013-02-15 DIAGNOSIS — I2510 Atherosclerotic heart disease of native coronary artery without angina pectoris: Secondary | ICD-10-CM | POA: Diagnosis present

## 2013-02-15 DIAGNOSIS — R748 Abnormal levels of other serum enzymes: Secondary | ICD-10-CM

## 2013-02-15 DIAGNOSIS — R17 Unspecified jaundice: Secondary | ICD-10-CM

## 2013-02-15 DIAGNOSIS — E860 Dehydration: Secondary | ICD-10-CM

## 2013-02-15 DIAGNOSIS — R0681 Apnea, not elsewhere classified: Secondary | ICD-10-CM | POA: Diagnosis not present

## 2013-02-15 DIAGNOSIS — K7460 Unspecified cirrhosis of liver: Secondary | ICD-10-CM

## 2013-02-15 DIAGNOSIS — L03115 Cellulitis of right lower limb: Secondary | ICD-10-CM

## 2013-02-15 DIAGNOSIS — R0600 Dyspnea, unspecified: Secondary | ICD-10-CM

## 2013-02-15 DIAGNOSIS — N179 Acute kidney failure, unspecified: Secondary | ICD-10-CM | POA: Diagnosis present

## 2013-02-15 DIAGNOSIS — Z515 Encounter for palliative care: Secondary | ICD-10-CM

## 2013-02-15 DIAGNOSIS — J4489 Other specified chronic obstructive pulmonary disease: Secondary | ICD-10-CM | POA: Diagnosis present

## 2013-02-15 DIAGNOSIS — E875 Hyperkalemia: Secondary | ICD-10-CM

## 2013-02-15 DIAGNOSIS — I2789 Other specified pulmonary heart diseases: Secondary | ICD-10-CM | POA: Diagnosis present

## 2013-02-15 DIAGNOSIS — R570 Cardiogenic shock: Secondary | ICD-10-CM | POA: Diagnosis not present

## 2013-02-15 DIAGNOSIS — J9601 Acute respiratory failure with hypoxia: Secondary | ICD-10-CM

## 2013-02-15 DIAGNOSIS — I517 Cardiomegaly: Secondary | ICD-10-CM | POA: Diagnosis present

## 2013-02-15 DIAGNOSIS — I5043 Acute on chronic combined systolic (congestive) and diastolic (congestive) heart failure: Secondary | ICD-10-CM | POA: Diagnosis present

## 2013-02-15 DIAGNOSIS — Z8551 Personal history of malignant neoplasm of bladder: Secondary | ICD-10-CM

## 2013-02-15 DIAGNOSIS — R1900 Intra-abdominal and pelvic swelling, mass and lump, unspecified site: Secondary | ICD-10-CM

## 2013-02-15 DIAGNOSIS — M7989 Other specified soft tissue disorders: Secondary | ICD-10-CM

## 2013-02-15 DIAGNOSIS — D696 Thrombocytopenia, unspecified: Secondary | ICD-10-CM | POA: Diagnosis present

## 2013-02-15 DIAGNOSIS — M79609 Pain in unspecified limb: Secondary | ICD-10-CM

## 2013-02-15 DIAGNOSIS — I259 Chronic ischemic heart disease, unspecified: Secondary | ICD-10-CM | POA: Diagnosis present

## 2013-02-15 DIAGNOSIS — I4891 Unspecified atrial fibrillation: Secondary | ICD-10-CM | POA: Diagnosis present

## 2013-02-15 DIAGNOSIS — J449 Chronic obstructive pulmonary disease, unspecified: Secondary | ICD-10-CM | POA: Diagnosis present

## 2013-02-15 DIAGNOSIS — Z79899 Other long term (current) drug therapy: Secondary | ICD-10-CM

## 2013-02-15 DIAGNOSIS — L03119 Cellulitis of unspecified part of limb: Secondary | ICD-10-CM

## 2013-02-15 DIAGNOSIS — Z87891 Personal history of nicotine dependence: Secondary | ICD-10-CM

## 2013-02-15 DIAGNOSIS — I5023 Acute on chronic systolic (congestive) heart failure: Secondary | ICD-10-CM

## 2013-02-15 DIAGNOSIS — E7800 Pure hypercholesterolemia, unspecified: Secondary | ICD-10-CM | POA: Diagnosis present

## 2013-02-15 DIAGNOSIS — D509 Iron deficiency anemia, unspecified: Secondary | ICD-10-CM

## 2013-02-15 DIAGNOSIS — I428 Other cardiomyopathies: Principal | ICD-10-CM | POA: Diagnosis present

## 2013-02-15 DIAGNOSIS — I10 Essential (primary) hypertension: Secondary | ICD-10-CM | POA: Diagnosis present

## 2013-02-15 DIAGNOSIS — I2589 Other forms of chronic ischemic heart disease: Secondary | ICD-10-CM | POA: Diagnosis present

## 2013-02-15 DIAGNOSIS — I5020 Unspecified systolic (congestive) heart failure: Secondary | ICD-10-CM

## 2013-02-15 DIAGNOSIS — E785 Hyperlipidemia, unspecified: Secondary | ICD-10-CM | POA: Diagnosis present

## 2013-02-15 DIAGNOSIS — L02419 Cutaneous abscess of limb, unspecified: Secondary | ICD-10-CM

## 2013-02-15 DIAGNOSIS — R188 Other ascites: Secondary | ICD-10-CM

## 2013-02-15 DIAGNOSIS — R7401 Elevation of levels of liver transaminase levels: Secondary | ICD-10-CM

## 2013-02-15 DIAGNOSIS — I255 Ischemic cardiomyopathy: Secondary | ICD-10-CM | POA: Diagnosis present

## 2013-02-15 DIAGNOSIS — J90 Pleural effusion, not elsewhere classified: Secondary | ICD-10-CM | POA: Diagnosis present

## 2013-02-15 DIAGNOSIS — R579 Shock, unspecified: Secondary | ICD-10-CM

## 2013-02-15 DIAGNOSIS — I252 Old myocardial infarction: Secondary | ICD-10-CM

## 2013-02-15 HISTORY — DX: Unspecified cirrhosis of liver: K74.60

## 2013-02-15 HISTORY — DX: Non-ST elevation (NSTEMI) myocardial infarction: I21.4

## 2013-02-15 LAB — CBC WITH DIFFERENTIAL/PLATELET
Basophils Absolute: 0 10*3/uL (ref 0.0–0.1)
Basophils Relative: 0 % (ref 0–1)
HCT: 30.7 % — ABNORMAL LOW (ref 39.0–52.0)
Hemoglobin: 10.9 g/dL — ABNORMAL LOW (ref 13.0–17.0)
Lymphocytes Relative: 8 % — ABNORMAL LOW (ref 12–46)
Monocytes Relative: 10 % (ref 3–12)
Neutro Abs: 6.7 10*3/uL (ref 1.7–7.7)
Neutrophils Relative %: 82 % — ABNORMAL HIGH (ref 43–77)
WBC: 8.1 10*3/uL (ref 4.0–10.5)

## 2013-02-15 LAB — URINALYSIS, ROUTINE W REFLEX MICROSCOPIC
Ketones, ur: NEGATIVE mg/dL
Nitrite: NEGATIVE
Protein, ur: NEGATIVE mg/dL
Urobilinogen, UA: 1 mg/dL (ref 0.0–1.0)

## 2013-02-15 LAB — COMPREHENSIVE METABOLIC PANEL
ALT: 77 U/L — ABNORMAL HIGH (ref 0–53)
AST: 141 U/L — ABNORMAL HIGH (ref 0–37)
Albumin: 1.7 g/dL — ABNORMAL LOW (ref 3.5–5.2)
CO2: 29 mEq/L (ref 19–32)
Calcium: 8.6 mg/dL (ref 8.4–10.5)
Chloride: 91 mEq/L — ABNORMAL LOW (ref 96–112)
GFR calc non Af Amer: 45 mL/min — ABNORMAL LOW (ref >90)
Sodium: 126 mEq/L — ABNORMAL LOW (ref 135–145)

## 2013-02-15 LAB — URINE MICROSCOPIC-ADD ON

## 2013-02-15 LAB — APTT: aPTT: 45 seconds — ABNORMAL HIGH (ref 24–37)

## 2013-02-15 LAB — OSMOLALITY, URINE: Osmolality, Ur: 455 mOsm/kg (ref 390–1090)

## 2013-02-15 LAB — MAGNESIUM: Magnesium: 2.3 mg/dL (ref 1.5–2.5)

## 2013-02-15 MED ORDER — IPRATROPIUM BROMIDE 0.02 % IN SOLN: 0.5000 mg | RESPIRATORY_TRACT | Status: DC | PRN

## 2013-02-15 MED ORDER — NAPROXEN SODIUM 220 MG PO TABS
220.0000 mg | ORAL_TABLET | Freq: Two times a day (BID) | ORAL | Status: DC
  Filled 2013-02-15 (×2): qty 1

## 2013-02-15 MED ORDER — ONDANSETRON HCL 4 MG PO TABS: 4.0000 mg | ORAL_TABLET | Freq: Four times a day (QID) | ORAL | Status: DC | PRN

## 2013-02-15 MED ORDER — ENSURE PUDDING PO PUDG
1.0000 | Freq: Every day | ORAL | Status: DC
  Administered 2013-02-15 – 2013-02-19 (×5): 1 via ORAL
  Filled 2013-02-15 (×7): qty 1

## 2013-02-15 MED ORDER — DOCUSATE SODIUM 100 MG PO CAPS
100.0000 mg | ORAL_CAPSULE | Freq: Every day | ORAL | Status: DC
  Administered 2013-02-15 – 2013-02-19 (×5): 100 mg via ORAL
  Filled 2013-02-15 (×6): qty 1

## 2013-02-15 MED ORDER — IBUPROFEN 800 MG PO TABS: 400.0000 mg | ORAL_TABLET | Freq: Four times a day (QID) | ORAL | Status: DC | PRN

## 2013-02-15 MED ORDER — BACITRACIN-NEOMYCIN-POLYMYXIN 400-5-5000 EX OINT
1.0000 "application " | TOPICAL_OINTMENT | Freq: Two times a day (BID) | CUTANEOUS | Status: DC
  Administered 2013-02-15 – 2013-02-17 (×2): 1 via TOPICAL
  Filled 2013-02-15 (×2): qty 1

## 2013-02-15 MED ORDER — CLOPIDOGREL BISULFATE 75 MG PO TABS
75.0000 mg | ORAL_TABLET | Freq: Every day | ORAL | Status: DC
  Administered 2013-02-15 – 2013-02-17 (×3): 75 mg via ORAL
  Filled 2013-02-15 (×4): qty 1

## 2013-02-15 MED ORDER — ASPIRIN 81 MG PO CHEW
81.0000 mg | CHEWABLE_TABLET | ORAL | Status: DC
  Administered 2013-02-15 – 2013-02-19 (×3): 81 mg via ORAL
  Filled 2013-02-15 (×3): qty 1

## 2013-02-15 MED ORDER — FERROUS GLUCONATE 324 (38 FE) MG PO TABS
325.0000 mg | ORAL_TABLET | Freq: Every day | ORAL | Status: DC
  Administered 2013-02-16: 324 mg via ORAL
  Filled 2013-02-15 (×3): qty 1

## 2013-02-15 MED ORDER — SODIUM CHLORIDE 0.9 % IV SOLN
INTRAVENOUS | Status: DC
  Administered 2013-02-15: 18:00:00 via INTRAVENOUS

## 2013-02-15 MED ORDER — SODIUM CHLORIDE 0.9 % IJ SOLN
3.0000 mL | Freq: Two times a day (BID) | INTRAMUSCULAR | Status: DC
  Administered 2013-02-15 – 2013-02-19 (×4): 3 mL via INTRAVENOUS

## 2013-02-15 MED ORDER — NAPROXEN 250 MG PO TABS
250.0000 mg | ORAL_TABLET | Freq: Two times a day (BID) | ORAL | Status: DC
  Administered 2013-02-15 – 2013-02-18 (×6): 250 mg via ORAL
  Filled 2013-02-15 (×9): qty 1

## 2013-02-15 MED ORDER — POLYETHYLENE GLYCOL 3350 17 G PO PACK
17.0000 g | PACK | ORAL | Status: DC | PRN
  Administered 2013-02-17 – 2013-02-18 (×2): 17 g via ORAL
  Filled 2013-02-15 (×2): qty 1

## 2013-02-15 MED ORDER — PANTOPRAZOLE SODIUM 40 MG PO TBEC
40.0000 mg | DELAYED_RELEASE_TABLET | Freq: Every day | ORAL | Status: DC
  Administered 2013-02-15 – 2013-02-19 (×5): 40 mg via ORAL
  Filled 2013-02-15 (×6): qty 1

## 2013-02-15 MED ORDER — ALBUTEROL SULFATE (5 MG/ML) 0.5% IN NEBU
2.5000 mg | INHALATION_SOLUTION | Freq: Four times a day (QID) | RESPIRATORY_TRACT | Status: DC
  Administered 2013-02-16: 2.5 mg via RESPIRATORY_TRACT
  Filled 2013-02-15: qty 0.5

## 2013-02-15 MED ORDER — ENOXAPARIN SODIUM 40 MG/0.4ML ~~LOC~~ SOLN
40.0000 mg | SUBCUTANEOUS | Status: DC
  Administered 2013-02-15 – 2013-02-19 (×5): 40 mg via SUBCUTANEOUS
  Filled 2013-02-15 (×6): qty 0.4

## 2013-02-15 MED ORDER — ALUM & MAG HYDROXIDE-SIMETH 200-200-20 MG/5ML PO SUSP
30.0000 mL | Freq: Four times a day (QID) | ORAL | Status: DC | PRN
  Administered 2013-02-19: 30 mL via ORAL
  Filled 2013-02-15: qty 30

## 2013-02-15 MED ORDER — SODIUM CHLORIDE 0.9 % IV SOLN: INTRAVENOUS | Status: DC

## 2013-02-15 MED ORDER — TIOTROPIUM BROMIDE MONOHYDRATE 18 MCG IN CAPS
18.0000 ug | ORAL_CAPSULE | Freq: Every day | RESPIRATORY_TRACT | Status: DC
  Administered 2013-02-16 – 2013-02-20 (×5): 18 ug via RESPIRATORY_TRACT
  Filled 2013-02-15: qty 5

## 2013-02-15 MED ORDER — ONDANSETRON HCL 4 MG/2ML IJ SOLN
4.0000 mg | Freq: Four times a day (QID) | INTRAMUSCULAR | Status: DC | PRN
  Administered 2013-02-20 (×3): 4 mg via INTRAVENOUS
  Filled 2013-02-15 (×3): qty 2

## 2013-02-15 MED ORDER — IPRATROPIUM BROMIDE 0.02 % IN SOLN: 0.5000 mg | Freq: Four times a day (QID) | RESPIRATORY_TRACT | Status: DC

## 2013-02-15 MED ORDER — NITROGLYCERIN 0.4 MG SL SUBL: 0.4000 mg | SUBLINGUAL_TABLET | SUBLINGUAL | Status: DC | PRN

## 2013-02-15 MED ORDER — VANCOMYCIN HCL IN DEXTROSE 750-5 MG/150ML-% IV SOLN
750.0000 mg | INTRAVENOUS | Status: DC
  Filled 2013-02-15: qty 150

## 2013-02-15 MED ORDER — VANCOMYCIN HCL IN DEXTROSE 1-5 GM/200ML-% IV SOLN
1000.0000 mg | Freq: Once | INTRAVENOUS | Status: AC
  Administered 2013-02-15: 1000 mg via INTRAVENOUS
  Filled 2013-02-15: qty 200

## 2013-02-15 MED ORDER — ALBUTEROL SULFATE (5 MG/ML) 0.5% IN NEBU: 2.5000 mg | INHALATION_SOLUTION | RESPIRATORY_TRACT | Status: DC | PRN

## 2013-02-15 NOTE — Progress Notes (Signed)
ANTIBIOTIC CONSULT NOTE - INITIAL  Pharmacy Consult for Vanc Indication: Cellulitis  Allergies  Allergen Reactions  . Fentanyl And Related Anaphylaxis  . Morphine And Related Anaphylaxis    Cardiac arrest  . Other Other (See Comments)    ALL SEDATIVES;  Lethargic, can hardly function, etc.  . Lisinopril Cough  . Vioxx (Rofecoxib) Other (See Comments)    Lethargic, can hardly function, very hard to wake up    Patient Measurements: Height: 5' (152.4 cm) Weight: 163 lb 2.3 oz (74 kg) IBW/kg (Calculated) : 50  Vital Signs: Temp: 97.9 F (36.6 C) (04/27 1500) Temp src: Oral (04/27 1500) BP: 123/44 mmHg (04/27 1500) Pulse Rate: 80 (04/27 1500) Intake/Output from previous day:   Intake/Output from this shift:    Labs:  Recent Labs  02/03/2013 1115  WBC 8.1  HGB 10.9*  PLT 156  CREATININE 1.38*   Estimated Creatinine Clearance: 33 ml/min (by C-G formula based on Cr of 1.38). No results found for this basename: VANCOTROUGH, VANCOPEAK, VANCORANDOM, GENTTROUGH, GENTPEAK, GENTRANDOM, TOBRATROUGH, TOBRAPEAK, TOBRARND, AMIKACINPEAK, AMIKACINTROU, AMIKACIN,  in the last 72 hours   Microbiology: No results found for this or any previous visit (from the past 720 hour(s)).  Medical History: Past Medical History  Diagnosis Date  . CAD (coronary artery disease)     Remote inferior MI in 1984, complicated by VFIB requiring multiple shocks, cardiogenic shock and CHF  . CHF (congestive heart failure)     Last echo in 2005 with EF 50 to 55%  . Arrhythmia     Hx of VFIB in 1984  . Abnormal nuclear cardiac imaging test     Lexiscan in Jan 2011 with EF 44% and large area of infarct involving the inferoseptal basal, inferior and inferolateral wall.   . Iron deficiency anemia   . HTN (hypertension)   . Bladder cancer   . Hip fracture   . Chronic back pain   . Right bundle branch block     Chronic right bundle-branch block.   . History of tobacco abuse   . Cellulitis      Chronic  cellulitis right ankle.  . Anemia   . CAD (coronary artery disease)     With remote large inferior MI dating back to 1984 with associated VFib arrest. Was not cathed.   . Dyslipidemia   . Nonhealing nonsurgical wound      Infected, chronic nonhealing wound, right ankle  . Postoperative ileus   . Hypercholesterolemia   . Hyponatremia   . Inguinal hernia     Strangulated recurrent right inguinal hernia  . Depression   . Osteoarthritis     severe  . Chronic constipation   . Palpitations   . Systolic heart failure     EF is 25% per echo in October 2012 with global hypokinesis and posterior and inferior akinesis, as well as grade 2 diastolic dysfunction, moderate TR and mild pulmonary HTN    Medications:  Anti-infectives   Start     Dose/Rate Route Frequency Ordered Stop   02/18/2013 1230  vancomycin (VANCOCIN) IVPB 1000 mg/200 mL premix     1,000 mg 200 mL/hr over 60 Minutes Intravenous  Once 01/26/2013 1217 02/09/2013 1411     Assessment: 87 yom w/ RLE cellulitis. Pharmacy asked to dose Vancomycin.  Wt 74kg, Scr 1.4. CrCl ~ 33ml/min  WBC wnl, afebrile.   Ucx pending  Rec'd 1g IV Vanc in ER~1320  Goal of Therapy:  Vancomycin trough level 10-15 mcg/ml  Plan:  Vancomycin 750mg  IV q24h  Follow labs vitals and cultures  VT @ [facility]  Adjust dose as necessary  Gwen Her PharmD  [phone] 02/12/2013 4:46 PM

## 2013-02-15 NOTE — ED Notes (Signed)
Family at bedside. 

## 2013-02-15 NOTE — Progress Notes (Signed)
VASCULAR LAB PRELIMINARY  PRELIMINARY  PRELIMINARY  PRELIMINARY  Right lower extremity venous Doppler completed.    Preliminary report:  There is no DVT or SVT noted in the right lower extremity.  Shatina Streets, RVT 01/26/2013, 12:41 PM

## 2013-02-15 NOTE — ED Notes (Signed)
[id number]<JY> Expected date:<BR> Expected time:<BR> Means of arrival:<BR> Comments:<BR> Transfer from [facility]

## 2013-02-15 NOTE — Progress Notes (Signed)
  Subjective:    Patient ID: Trevor [name], male    DOB: [birth year], 79 y.o.   MRN: [id number]  HPI patient states that 2 weeks ago he went to the diabetes and had work done on his right great toenail. After this he has had progressive redness swelling and discomfort in his right lower leg. It has become difficult for him to walk. He understood that because of the redness swelling and discomfort in his right leg the    Review of Systems pertinent history reveals patient has ischemic heart disease with cardiomyopathy and history of atrial fibrillation. He also has a history of bladder cancer.     Objective:   Physical Exam patient is alert and cooperative he is obviously jaundiced. He has significant scleral icterus. Cardiac exam reveals a 3/6 systolic murmur at the left sternal border with mild irregularity. The abdomen exam reveals suspicion of ascites there is an orange size mass in the right lower abdomen. Extremity exam reveals the entire right leg to be swollen, inflamed. Red. He has 4+ pitting edema. His capillary fill time of about 4 seconds but I could not feel dorsalis pedis or posterior tibial pulses.        Assessment & Plan:  Patient here with a cellulitis of the right leg with possible phlebitis. He also is jaundiced and has a mass palpable in the right lower abdomen with a past history of bladder cancer. This could either be a primary or metastatic disease related to his bladder cancer. He is sent to the emergency room due to concerns for loss of limb due to cellulitis of the right leg.He also needs abd mass eval.

## 2013-02-15 NOTE — ED Notes (Signed)
[id number]<AV> Expected date:02/18/2013<BR> Expected time:10:36 AM<BR> Means of arrival:Ambulance<BR> Comments:<BR>

## 2013-02-15 NOTE — ED Notes (Signed)
Pt to room 9 via EMS from urgent care. Per report, pt having right lower leg pain with redness and swelling. Pt arrives alert and oriented and states "I'm scared" Right leg is red, weeping, and swollen at this time. Pt also complains of abdominal swelling and discomfort.

## 2013-02-15 NOTE — H&P (Signed)
[facility] History and Physical  Trevor [name] DOB: 1925/01/28 DOA: Mar 09, 2013  Referring physician: Dr. Devoria [name] PCP: Trevor Clement, MD  Specialists: Cardiology: Dr. Patty [name]  Chief Complaint: Right lower extremity swelling and redness  HPI: Trevor [name] is a 79 y.o. male with a prior history of coronary artery disease with remote inferior MI in 1984 complicated by V. fib requiring multiple shocks, cardiogenic shock and CHF, history of hypertension, hyperlipidemia, and deficiency anemia who presents to the ED with a 12 day history of worsening right lower extremity erythema, warmth, tenderness to palpation, and weeping. Patient states that he sees a podiatrist to manage his thick toenails. Patient stated that he saw the podiatrist on April 15 and had tried to go deep to get his great toenail and did have some bleeding from it. Patient stated that he tried to turn down some of his calluses and corns on his MTP of the great toe and on his heel. Patient stated that 2 days after that visit was the onset of his symptoms as stated above. Patient denied any fever, no nausea, no vomiting, no abdominal pain, no diarrhea, no constipation, no weakness, no chest pain, no shortness of breath. Patient does endorse some mild chills as well. This patient and family his symptoms have gotten worse over the past 2-3 days. Patient was seen in the emergency room lower extremity Dopplers which were done were negative for DVT. Comprehensive metabolic profile done had a sodium of 126 chloride of 91 BUN of 24 creatinine of 1.38 alkaline phosphatase lipase of 148 albumin 1.7 lipase of 60 AST of 141 ALT of 77 protein of 8.1 total bilirubin of 7.5. CBC done had a white count of 8.1 hemoglobin of 10.9 and a platelet count of 156. Abdominal ultrasound which was done came back with small amount of biliary sludge in the gallbladder with a mildly edematous gallbladder and negative for Murphy's sign. It  was also negative for acute cholecystitis. Patient was given a dose of IV vancomycin we will call to admit the patient for further evaluation and management.  Review of Systems: The patient denies anorexia, fever, weight loss,, vision loss, decreased hearing, hoarseness, chest pain, syncope, dyspnea on exertion, peripheral edema, balance deficits, hemoptysis, abdominal pain, melena, hematochezia, severe indigestion/heartburn, hematuria, incontinence, genital sores, muscle weakness, suspicious skin lesions, transient blindness, difficulty walking, depression, unusual weight change, abnormal bleeding, enlarged lymph nodes, angioedema, and breast masses.   Past Medical History  Diagnosis Date  . CAD (coronary artery disease)     Remote inferior MI in 1984, complicated by VFIB requiring multiple shocks, cardiogenic shock and CHF  . CHF (congestive heart failure)     Last echo in 2005 with EF 50 to 55%  . Arrhythmia     Hx of VFIB in 1984  . Abnormal nuclear cardiac imaging test     Lexiscan in Jan 2011 with EF 44% and large area of infarct involving the inferoseptal basal, inferior and inferolateral wall.   . Iron deficiency anemia   . HTN (hypertension)   . Bladder cancer   . Hip fracture   . Chronic back pain   . Right bundle branch block     Chronic right bundle-branch block.   . History of tobacco abuse   . Cellulitis      Chronic cellulitis right ankle.  . Anemia   . CAD (coronary artery disease)     With remote large inferior MI dating back to 1984 with associated  VFib arrest. Was not cathed.   . Dyslipidemia   . Nonhealing nonsurgical wound      Infected, chronic nonhealing wound, right ankle  . Postoperative ileus   . Hypercholesterolemia   . Hyponatremia   . Inguinal hernia     Strangulated recurrent right inguinal hernia  . Depression   . Osteoarthritis     severe  . Chronic constipation   . Palpitations   . Systolic heart failure     EF is 25% per echo in October 2012  with global hypokinesis and posterior and inferior akinesis, as well as grade 2 diastolic dysfunction, moderate TR and mild pulmonary HTN   Past Surgical History  Procedure Laterality Date  . Hernia repair    . Hip surgery    . Ankle surgery      past right ankle fracture   Social History:  reports that he quit smoking about 29 years ago. His smoking use included Cigarettes. He has a 26 pack-year smoking history. He has quit using smokeless tobacco. His smokeless tobacco use included Chew. He reports that he does not drink alcohol or use illicit drugs.  Allergies  Allergen Reactions  . Fentanyl And Related Anaphylaxis  . Morphine And Related Anaphylaxis    Cardiac arrest  . Other Other (See Comments)    ALL SEDATIVES;  Lethargic, can hardly function, etc.  . Lisinopril Cough  . Vioxx (Rofecoxib) Other (See Comments)    Lethargic, can hardly function, very hard to wake up    Family History  Problem Relation Age of Onset  . Heart disease Father   . Heart attack Father   . Hypertension Father   . Emphysema Brother   . Coronary artery disease Sister     X5 CABG still living  . Heart failure Sister   . Hypertension Mother     Prior to Admission medications   Medication Sig Start Date End Date Taking? Authorizing Provider  aspirin 81 MG tablet Take 81 mg by mouth every other day.   Yes Historical Provider, MD  atorvastatin (LIPITOR) 20 MG tablet Take 1 tablet (20 mg total) by mouth daily. 06/11/12  Yes Trevor Clement, MD  calcium carbonate (OS-CAL) 600 MG TABS Take 600 mg by mouth 2 (two) times daily with a meal.     Yes Historical Provider, MD  Cholecalciferol (VITAMIN D PO) Take 1,000 Int'l Units by mouth daily.   Yes Historical Provider, MD  clopidogrel (PLAVIX) 75 MG tablet Take 1 tablet (75 mg total) by mouth daily. 07/04/12  Yes Trevor Clement, MD  docusate sodium (COLACE) 100 MG capsule Take 100 mg by mouth daily.   Yes Historical Provider, MD  feeding supplement  (ENSURE) PUDG Take 1 Container by mouth daily. 05/19/12  Yes Brooke O Edmisten, PA-C  ferrous gluconate (FERGON) 325 MG tablet Take 1 tablet (325 mg total) by mouth daily with breakfast. 06/11/12  Yes Trevor Clement, MD  furosemide (LASIX) 40 MG tablet Take 40 mg by mouth as directed. 2 in the morning and 2 in the evening - changed 4/25 09/12/12 10/21/22 Yes Trevor Clement, MD  losartan (COZAAR) 50 MG tablet Take 50 mg by mouth daily. Take 1/2 daily   Yes Historical Provider, MD  Multiple Vitamin (MULTIVITAMIN WITH MINERALS) TABS Take 1 tablet by mouth daily.   Yes Historical Provider, MD  naproxen sodium (ANAPROX) 220 MG tablet Take 220 mg by mouth 2 (two) times daily with a meal.   Yes Historical Provider, MD  neomycin-bacitracin-polymyxin (  NEOSPORIN) ointment Apply 1 application topically every 12 (twelve) hours. apply to eye   Yes Historical Provider, MD  nitroGLYCERIN (NITROSTAT) 0.4 MG SL tablet Place 1 tablet (0.4 mg total) under the tongue every 5 (five) minutes as needed. For chest pain 06/11/12  Yes Trevor Clement, MD  polyethylene glycol [facility] / GLYCOLAX) packet Take 17 g by mouth as needed (constipation).    Yes Historical Provider, MD  potassium chloride SA (K-DUR,KLOR-CON) 20 MEQ tablet Take 1 tablet (20 mEq total) by mouth 2 (two) times daily. 07/04/12  Yes Trevor Clement, MD  tiotropium (SPIRIVA) 18 MCG inhalation capsule Place 1 capsule (18 mcg total) into inhaler and inhale daily. 07/04/12  Yes Trevor Clement, MD   Physical Exam: Filed Vitals:   01/31/2013 1049  BP: 117/61  Pulse: 90  Temp: 97.6 F (36.4 C)  TempSrc: Oral  Resp: 16  SpO2: 97%     General:  Well-developed well-nourished in no acute cardiopulmonary distress. Jaundiced.  Eyes: Icteric sclera. Pupils equal round and reactive to light and accommodation. Extraocular movements intact.  ENT: Clear, no lesions, no exudates.   Neck: Supple with no lymphadenopathy. No JVD.  Cardiovascular: Irregularly  irregular with a 3/6 systolic ejection murmur  Respiratory: Some diffuse) sounds. Decreased breath sounds in the bases.  Abdomen: Soft, nontender, nondistended, positive bowel sounds.  Skin: No rashes or lesions. Right lower extremity angry looking with significant erythema from the mid foot all the way up to approximately the mid thigh, 2+ edema, a rate of edematous.  Musculoskeletal: 4/5 bilateral upper extremity strength.4/5 bilateral lower extremity strength.  Psychiatric: Normal mood. Normal affect. Good insight. Her judgment.  Neurologic: Alert and oriented x3. Cranial nerves II through XII are grossly intact. No focal deficits. Gait not tested secondary to safety.  Extremities: Right lower extremity with significant erythema, warmth, 2-3+ bilateral lower extremity edema.  Labs on Admission:  Basic Metabolic Panel:  Recent Labs Lab 02/17/2013 1115  NA 126*  K 5.0  CL 91*  CO2 29  GLUCOSE 85  BUN 24*  CREATININE 1.38*  CALCIUM 8.6   Liver Function Tests:  Recent Labs Lab 02/14/2013 1115  AST 141*  ALT 77*  ALKPHOS 148*  BILITOT 7.5*  PROT 8.1  ALBUMIN 1.7*    Recent Labs Lab 02/16/2013 1115  LIPASE 60*   No results found for this basename: AMMONIA,  in the last 168 hours CBC:  Recent Labs Lab 02/13/2013 1115  WBC 8.1  NEUTROABS 6.7  HGB 10.9*  HCT 30.7*  MCV 67.6*  PLT 156   Cardiac Enzymes: No results found for this basename: CKTOTAL, CKMB, CKMBINDEX, TROPONINI,  in the last 168 hours  BNP (last 3 results)  Recent Labs  04/25/12 2248 05/14/12 0550 09/12/12 1053  PROBNP 14991.0* 2190.0* 391.0*   CBG: No results found for this basename: GLUCAP,  in the last 168 hours  Radiological Exams on Admission: US Abdomen Complete  02/16/2013  *RADIOLOGY REPORT*  Clinical Data:  Abdominal swelling.  Elevated liver function tests.  COMPLETE ABDOMINAL ULTRASOUND  Comparison:  No priors.  Findings:  Gallbladder:  Gallbladder wall appears thickened and  edematous measuring up to 7.8 mm.  There is a small amount of echogenic nonshadowing material layering dependently in the lumen of the gallbladder, which likely represents some biliary sludge.  However, the patient does not exhibit a sonographic Murphy's sign on examination at this time.  Common bile duct:  Normal caliber measuring 4.5 mm in the porta hepatis.  Liver:  Heterogeneous in echotexture, generally diffusely hyperechoic, suggesting heterogeneous fatty infiltration. Additionally, the portal triads appears slightly echogenic, which is nonspecific but can be seen in the setting of hepatitis.  No definite focal cystic or solid hepatic lesions.  Trace amount of perihepatic ascites.  IVC:  Appears normal.  Pancreas:  Poorly visualized secondary to overlying bowel gas.  Spleen:  Normal in echotexture and appearance measuring up to 6.3 cm in diameter.  There appears to be a trace amount of perisplenic ascites.  Right Kidney:  No hydronephrosis.  Well-preserved cortex.  Normal size and parenchymal echotexture without focal abnormalities. 9.9 cm in length.  Left Kidney:  No hydronephrosis.  Well-preserved cortex.  Normal size and parenchymal echotexture without focal abnormalities. 9.2 cm in length.  Abdominal aorta:  Measures up to 2 cm in diameter proximally. Poorly visualized distally.  IMPRESSION: 1.  Small amount of biliary sludge in the gallbladder.  Gallbladder appears mildly edematous.  However, there is no sonographic Murphy's sign on examination per report from the sonographer. Findings are abnormal, but not definitive for acute cholecystitis. Further clinical correlation is recommended. 2.  Heterogeneously hyperechoic hepatic parenchyma suggestive of some degree of fatty infiltration.  Additionally, the portal triads are diffusely echogenic, which can be seen in the setting of hepatitis.  Correlation with liver function tests is recommended. 3.  Trace volume of perihepatic and perisplenic ascites.    Original Report Authenticated By: Trudie Reed, M.D.     EKG: None  Assessment/Plan Principal Problem:   Cellulitis of leg, right Active Problems:   Ischemic heart disease   Hypercholesterolemia   Iron deficiency anemia   Hx of bladder cancer   Congestive heart failure with left ventricular systolic dysfunction   Ischemic cardiomyopathy   HTN (hypertension)   COPD (chronic obstructive pulmonary disease)   Atrial fibrillation   Transaminitis   Hyperbilirubinemia   Hyponatremia   Acute renal insufficiency  #1 cellulitis of the right lower extremity Patient does have a significant cellulitis of the right lower extremity from his mid foot to his mid thigh. Likely introduce after toenail clipping by his podiatrist as per family patient was bleeding from the great toe. Will check blood cultures x2.  Keep right lower extremity elevated above the heart. We'll place empirically on IV vancomycin and Ancef. Follow.  #2 transaminitis/hyperbilirubinemia/jaundice Questionable etiology. May be likely secondary to intrahepatic cholestasis. May be viral versus chemical versus autoimmune in etiology versus infectious etiology. Abdominal ultrasound showed biliary sludge negative for acute cholecystitis with a normal common bile duct. Patient is asymptomatic. Will fractionate bilirubin, check a magnesium level, check an acute hepatitis level, check a haptoglobin, check coags, check ANA, check PT/INR. Will discontinue patient's statin for now. Will consult with GI for further evaluation and management.  #3 hyponatremia Likely secondary to hypovolemic hyponatremia versus hypervolemic hyponatremia. Vision does have bilateral lower extremity edema left is chronic in nature right likely secondary to cellulitis. Will check a urine sodium. Check a urine creatinine. Check a urine osmolality. Check a serum osmolality. We'll place on gentle IV hydration for now. If no significant improvement in patient's  hyponatremia may need to place patient on diuretics. Follow.  #4 acute renal insufficiency Likely secondary to prerenal azotemia. Check a urine sodium. Check a urine creatinine. Check a UA with cultures and sensitivities. Will hold patient's Lasix and ARB for now. Follow. Monitor fluid/volume status.  #5 hyperlipidemia Will hold patient's Lipitor secondary to elevated LFTs.  #6 coronary artery disease Stable. Patient denies any chest  pain or shortness of breath. Hold patient's diuretics secondary to acute renal insufficiency. Continue aspirin and Plavix.  #7 prophylaxis PPI for GI prophylaxis. Lovenox for DVT prophylaxis.   Code Status: DO NOT RESUSCITATE Family Communication: Updated patient and family at bedside. Disposition Plan: Admit to telemetry.  Time spent: 80 mins  [facility] Pager [phone]  If 7PM-7AM, please contact night-coverage [email or web address] Password [facility] 01/31/2013, 4:13 PM

## 2013-02-15 NOTE — ED Notes (Signed)
MD at bedside. 

## 2013-02-15 NOTE — ED Notes (Signed)
Admitting MD at bedside.

## 2013-02-15 NOTE — ED Provider Notes (Signed)
History     CSN: [id number]  Arrival date & time 02/10/2013  1033   First MD Initiated Contact with Patient 01/21/2013 1113      Chief Complaint  Patient presents with  . Cellulitis    (Consider location/radiation/quality/duration/timing/severity/associated sxs/prior treatment) HPI   Patient relates he has thick toenails and he goes to a podiatrist to have them clipped. He relates he had them done on April 15. He reports she had to try to go deep on his great toenail and he did get some bleeding from it. They also tried to trim down some of his calluses/corns on his MTP of the great toe and on his heel. They report about 2 days later he started having swelling of his right great toe and then a couple days after that he developed swelling in his right foot and ankle. 4 days ago he started getting intense redness of his leg. He states he's had chills but no documented fever. He states he does not have pain but he has a "stinging" sensation. He also relates that leg it started weeping clear fluid. He states he feels like his abdomen is getting swollen and he feels like he is having difficulty urinating and feels like he is having decreased urinary output. When asked about his skin, which appeared to have a definite yellow tinge to it family states they body appeared normal. However patient states his urine has been very dark brown in color. He denies any chest pain or shortness of breath.   PCP Dr Patty Sermons  Next appt 5/5 Podiatrist next appt on 4/30    Past Medical History  Diagnosis Date  . CAD (coronary artery disease)     Remote inferior MI in 1984, complicated by VFIB requiring multiple shocks, cardiogenic shock and CHF  . CHF (congestive heart failure)     Last echo in 2005 with EF 50 to 55%  . Arrhythmia     Hx of VFIB in 1984  . Abnormal nuclear cardiac imaging test     Lexiscan in Jan 2011 with EF 44% and large area of infarct involving the inferoseptal basal, inferior and  inferolateral wall.   . Iron deficiency anemia   . HTN (hypertension)   . Bladder cancer   . Hip fracture   . Chronic back pain   . Right bundle branch block     Chronic right bundle-branch block.   . History of tobacco abuse   . Cellulitis      Chronic cellulitis right ankle.  . Anemia   . CAD (coronary artery disease)     With remote large inferior MI dating back to 1984 with associated VFib arrest. Was not cathed.   . Dyslipidemia   . Nonhealing nonsurgical wound      Infected, chronic nonhealing wound, right ankle  . Postoperative ileus   . Hypercholesterolemia   . Hyponatremia   . Inguinal hernia     Strangulated recurrent right inguinal hernia  . Depression   . Osteoarthritis     severe  . Chronic constipation   . Palpitations   . Systolic heart failure     EF is 25% per echo in October 2012 with global hypokinesis and posterior and inferior akinesis, as well as grade 2 diastolic dysfunction, moderate TR and mild pulmonary HTN    Past Surgical History  Procedure Laterality Date  . Hernia repair    . Hip surgery    . Ankle surgery  past right ankle fracture    Family History  Problem Relation Age of Onset  . Heart disease Father   . Heart attack Father   . Hypertension Father   . Emphysema Brother   . Coronary artery disease Sister     X5 CABG still living  . Heart failure Sister   . Hypertension Mother     History  Substance Use Topics  . Smoking status: Former Smoker -- 1.00 packs/day for 26 years    Types: Cigarettes    Quit date: 08/17/1983  . Smokeless tobacco: Former Neurosurgeon    Types: Chew  . Alcohol Use: No   Lives at home Lives with his wife.   Review of Systems  All other systems reviewed and are negative.    Allergies  Fentanyl and related; Morphine and related; Other; Lisinopril; and Vioxx  Home Medications   Current Outpatient Rx  Name  Route  Sig  Dispense  Refill  . aspirin 81 MG tablet   Oral   Take 81 mg by mouth  every other day.         [name] atorvastatin (LIPITOR) 20 MG tablet   Oral   Take 1 tablet (20 mg total) by mouth daily.   90 tablet   3   . calcium carbonate (OS-CAL) 600 MG TABS   Oral   Take 600 mg by mouth 2 (two) times daily with a meal.           . Cholecalciferol (VITAMIN D PO)   Oral   Take 1,000 Int'l Units by mouth daily.         . clopidogrel (PLAVIX) 75 MG tablet   Oral   Take 1 tablet (75 mg total) by mouth daily.   90 tablet   3   . docusate sodium (COLACE) 100 MG capsule   Oral   Take 100 mg by mouth daily.         . feeding supplement (ENSURE) PUDG   Oral   Take 1 Container by mouth daily.         . ferrous gluconate (FERGON) 325 MG tablet   Oral   Take 1 tablet (325 mg total) by mouth daily with breakfast.   90 tablet   3   . furosemide (LASIX) 40 MG tablet   Oral   Take 40 mg by mouth as directed. 2 in the morning and 2 in the evening - changed 4/25         . losartan (COZAAR) 50 MG tablet   Oral   Take 50 mg by mouth daily. Take 1/2 daily         . Multiple Vitamin (MULTIVITAMIN WITH MINERALS) TABS   Oral   Take 1 tablet by mouth daily.         . naproxen sodium (ANAPROX) 220 MG tablet   Oral   Take 220 mg by mouth 2 (two) times daily with a meal.         . neomycin-bacitracin-polymyxin (NEOSPORIN) ointment   Topical   Apply 1 application topically every 12 (twelve) hours. apply to eye         . nitroGLYCERIN (NITROSTAT) 0.4 MG SL tablet   Sublingual   Place 1 tablet (0.4 mg total) under the tongue every 5 (five) minutes as needed. For chest pain   100 tablet   3   . polyethylene glycol (MIRALAX / GLYCOLAX) packet   Oral   Take 17 g by mouth as  needed (constipation).          . potassium chloride SA (K-DUR,KLOR-CON) 20 MEQ tablet   Oral   Take 1 tablet (20 mEq total) by mouth 2 (two) times daily.   180 tablet   3   . tiotropium (SPIRIVA) 18 MCG inhalation capsule   Inhalation   Place 1 capsule (18 mcg total)  into inhaler and inhale daily.   90 capsule   3     BP 117/61  Pulse 90  Temp(Src) 97.6 F (36.4 C) (Oral)  Resp 16  SpO2 97%  Vital signs normal    Physical Exam  Nursing note and vitals reviewed. Constitutional: He is oriented to person, place, and time. He appears well-developed and well-nourished.  Non-toxic appearance. He does not appear ill. No distress.  HENT:  Head: Normocephalic and atraumatic.  Right Ear: External ear normal.  Left Ear: External ear normal.  Nose: Nose normal. No mucosal edema or rhinorrhea.  Mouth/Throat: Oropharynx is clear and moist and mucous membranes are normal. No dental abscesses or edematous.  Eyes: Conjunctivae and EOM are normal. Pupils are equal, round, and reactive to light. Scleral icterus is present.  Neck: Normal range of motion and full passive range of motion without pain. Neck supple.  Cardiovascular: Normal rate, regular rhythm and normal heart sounds.  Exam reveals no gallop and no friction rub.   No murmur heard. Pulmonary/Chest: Effort normal and breath sounds normal. No respiratory distress. He has no wheezes. He has no rhonchi. He has no rales. He exhibits no tenderness and no crepitus.  Abdominal: Soft. Normal appearance and bowel sounds are normal. He exhibits distension. There is no tenderness. There is no rebound and no guarding.  Abdomen appears enlarged which family states his abdomen is normally flatter. However it is soft. He has a large lower midline surgical scar.  Musculoskeletal: Normal range of motion. He exhibits no edema and no tenderness.  Moves all extremities well.   Patient noted to have a intensely red/purplish discoloration of his right lower leg that starts just proximal to the right knee and extends down into his foot. He has moderate swelling of his foot in his lower leg. It is warm to touch. He is noted to have some clear fluid filled blisters that are draining clear fluid. He's noted to have a black area  on his lateral aspect of his right great toenail that looks like old blood. He states that was the area that was bleeding earlier.  Neurological: He is alert and oriented to person, place, and time. He has normal strength. No cranial nerve deficit.  Skin: Skin is warm, dry and intact. No rash noted. No erythema. No pallor.  He should skin appears to be yellowish in color. Although family states he appears to be normal to them.  Psychiatric: He has a normal mood and affect. His speech is normal and behavior is normal. His mood appears not anxious.    ED Course  Procedures (including critical care time)  Medications  vancomycin (VANCOCIN) IVPB 1000 mg/200 mL premix (0 mg Intravenous Stopped 01/29/2013 1411)   Bladder scan showed 108 cc or urine.   14:40 Dr Janee Morn, admit to tele (has cardiac hx), to team 5      VASCULAR LAB  PRELIMINARY PRELIMINARY PRELIMINARY PRELIMINARY  Right lower extremity venous Doppler completed.  Preliminary report: There is no DVT or SVT noted in the right lower extremity.  KANADY, CANDACE, RVT  01/28/2013, 12:41 PM  Results for orders placed during the hospital encounter of 02/01/2013  CBC WITH DIFFERENTIAL      Result Value Range   WBC 8.1  4.0 - 10.5 K/uL   RBC 4.54  4.22 - 5.81 MIL/uL   Hemoglobin 10.9 (*) 13.0 - 17.0 g/dL   HCT 16.1 (*) 09.6 - 04.5 %   MCV 67.6 (*) 78.0 - 100.0 fL   MCH 24.0 (*) 26.0 - 34.0 pg   MCHC 35.5  30.0 - 36.0 g/dL   RDW 40.9 (*) 81.1 - 91.4 %   Platelets 156  150 - 400 K/uL   Neutrophils Relative 82 (*) 43 - 77 %   Lymphocytes Relative 8 (*) 12 - 46 %   Monocytes Relative 10  3 - 12 %   Eosinophils Relative 0  0 - 5 %   Basophils Relative 0  0 - 1 %   Neutro Abs 6.7  1.7 - 7.7 K/uL   Lymphs Abs 0.6 (*) 0.7 - 4.0 K/uL   Monocytes Absolute 0.8  0.1 - 1.0 K/uL   Eosinophils Absolute 0.0  0.0 - 0.7 K/uL   Basophils Absolute 0.0  0.0 - 0.1 K/uL   RBC Morphology TARGET CELLS     WBC Morphology TOXIC GRANULATION      Smear Review LARGE PLATELETS PRESENT    COMPREHENSIVE METABOLIC PANEL      Result Value Range   Sodium 126 (*) 135 - 145 mEq/L   Potassium 5.0  3.5 - 5.1 mEq/L   Chloride 91 (*) 96 - 112 mEq/L   CO2 29  19 - 32 mEq/L   Glucose, Bld 85  70 - 99 mg/dL   BUN 24 (*) 6 - 23 mg/dL   Creatinine, Ser 7.82 (*) 0.50 - 1.35 mg/dL   Calcium 8.6  8.4 - 95.6 mg/dL   Total Protein 8.1  6.0 - 8.3 g/dL   Albumin 1.7 (*) 3.5 - 5.2 g/dL   AST 213 (*) 0 - 37 U/L   ALT 77 (*) 0 - 53 U/L   Alkaline Phosphatase 148 (*) 39 - 117 U/L   Total Bilirubin 7.5 (*) 0.3 - 1.2 mg/dL   GFR calc non Af Amer 45 (*) >90 mL/min   GFR calc Af Amer 52 (*) >90 mL/min  LIPASE, BLOOD      Result Value Range   Lipase 60 (*) 11 - 59 U/L   Laboratory interpretation all normal except new elevation of LFT's,stable anemia, normal WBC with toxic granulation c/w infection, hyponatremia, low chloride, renal insufficiency    US Abdomen Complete  02/18/2013  *RADIOLOGY REPORT*  Clinical Data:  Abdominal swelling.  Elevated liver function tests.  COMPLETE ABDOMINAL ULTRASOUND  Comparison:  No priors.  Findings:  Gallbladder:  Gallbladder wall appears thickened and edematous measuring up to 7.8 mm.  There is a small amount of echogenic nonshadowing material layering dependently in the lumen of the gallbladder, which likely represents some biliary sludge.  However, the patient does not exhibit a sonographic Murphy's sign on examination at this time.  Common bile duct:  Normal caliber measuring 4.5 mm in the porta hepatis.  Liver:  Heterogeneous in echotexture, generally diffusely hyperechoic, suggesting heterogeneous fatty infiltration. Additionally, the portal triads appears slightly echogenic, which is nonspecific but can be seen in the setting of hepatitis.  No definite focal cystic or solid hepatic lesions.  Trace amount of perihepatic ascites.  IVC:  Appears normal.  Pancreas:  Poorly visualized secondary to overlying bowel gas.  Spleen:  Normal in echotexture and appearance measuring up to 6.3 cm in diameter.  There appears to be a trace amount of perisplenic ascites.  Right Kidney:  No hydronephrosis.  Well-preserved cortex.  Normal size and parenchymal echotexture without focal abnormalities. 9.9 cm in length.  Left Kidney:  No hydronephrosis.  Well-preserved cortex.  Normal size and parenchymal echotexture without focal abnormalities. 9.2 cm in length.  Abdominal aorta:  Measures up to 2 cm in diameter proximally. Poorly visualized distally.  IMPRESSION: 1.  Small amount of biliary sludge in the gallbladder.  Gallbladder appears mildly edematous.  However, there is no sonographic Murphy's sign on examination per report from the sonographer. Findings are abnormal, but not definitive for acute cholecystitis. Further clinical correlation is recommended. 2.  Heterogeneously hyperechoic hepatic parenchyma suggestive of some degree of fatty infiltration.  Additionally, the portal triads are diffusely echogenic, which can be seen in the setting of hepatitis.  Correlation with liver function tests is recommended. 3.  Trace volume of perihepatic and perisplenic ascites.   Original Report Authenticated By: Trudie Reed, M.D.      1. Cellulitis of right lower leg   2. Acute renal insufficiency   3. Increased liver enzymes     Plan admission   Devoria Albe, MD, FACEP    MDM  patient had recent manipulation of his right toe and now has cellulitis of his right lower leg. He does not have DVT per Doppler ultrasound study. He also has new onset of jaundice with elevation of his LFTs. He is on Lipitor which is the most likely etiology of this elevation. He does have an abnormal gallbladder however the radiologist who did not feel that was acute today.           Ward Givens, MD 01/22/2013 [phone]

## 2013-02-16 ENCOUNTER — Inpatient Hospital Stay (HOSPITAL_COMMUNITY): Payer: Medicare Other

## 2013-02-16 ENCOUNTER — Encounter (HOSPITAL_COMMUNITY): Payer: Self-pay | Admitting: Internal Medicine

## 2013-02-16 DIAGNOSIS — D509 Iron deficiency anemia, unspecified: Secondary | ICD-10-CM

## 2013-02-16 DIAGNOSIS — R17 Unspecified jaundice: Secondary | ICD-10-CM | POA: Diagnosis present

## 2013-02-16 LAB — BASIC METABOLIC PANEL
Calcium: 7.9 mg/dL — ABNORMAL LOW (ref 8.4–10.5)
GFR calc Af Amer: 69 mL/min — ABNORMAL LOW (ref >90)
GFR calc non Af Amer: 59 mL/min — ABNORMAL LOW (ref >90)
Glucose, Bld: 103 mg/dL — ABNORMAL HIGH (ref 70–99)
Potassium: 4.5 mEq/L (ref 3.5–5.1)
Sodium: 128 mEq/L — ABNORMAL LOW (ref 135–145)

## 2013-02-16 LAB — HEPATITIS PANEL, ACUTE
HCV Ab: NEGATIVE
Hep A IgM: NEGATIVE
Hep B C IgM: NEGATIVE
Hepatitis B Surface Ag: NEGATIVE

## 2013-02-16 LAB — CBC
MCH: 24.2 pg — ABNORMAL LOW (ref 26.0–34.0)
Platelets: 144 10*3/uL — ABNORMAL LOW (ref 150–400)
RBC: 3.89 MIL/uL — ABNORMAL LOW (ref 4.22–5.81)

## 2013-02-16 LAB — URINE CULTURE: Colony Count: NO GROWTH

## 2013-02-16 LAB — HEPATIC FUNCTION PANEL
ALT: 56 U/L — ABNORMAL HIGH (ref 0–53)
Albumin: 1.3 g/dL — ABNORMAL LOW (ref 3.5–5.2)
Indirect Bilirubin: 1.7 mg/dL — ABNORMAL HIGH (ref 0.3–0.9)
Total Protein: 6.3 g/dL (ref 6.0–8.3)

## 2013-02-16 LAB — SODIUM, URINE, RANDOM: Sodium, Ur: <10 mEq/L

## 2013-02-16 MED ORDER — VANCOMYCIN HCL 10 G IV SOLR
1250.0000 mg | INTRAVENOUS | Status: DC
  Administered 2013-02-16 – 2013-02-18 (×3): 1250 mg via INTRAVENOUS
  Filled 2013-02-16 (×3): qty 1250

## 2013-02-16 MED ORDER — IPRATROPIUM BROMIDE 0.02 % IN SOLN: 0.5000 mg | Freq: Four times a day (QID) | RESPIRATORY_TRACT | Status: DC | PRN

## 2013-02-16 MED ORDER — IOHEXOL 300 MG/ML  SOLN
100.0000 mL | Freq: Once | INTRAMUSCULAR | Status: AC | PRN
  Administered 2013-02-16: 100 mL via INTRAVENOUS

## 2013-02-16 MED ORDER — FERROUS GLUCONATE 324 (38 FE) MG PO TABS
324.0000 mg | ORAL_TABLET | Freq: Every day | ORAL | Status: DC
  Administered 2013-02-17 – 2013-02-20 (×4): 324 mg via ORAL
  Filled 2013-02-16 (×5): qty 1

## 2013-02-16 MED ORDER — ALBUTEROL SULFATE (5 MG/ML) 0.5% IN NEBU
2.5000 mg | INHALATION_SOLUTION | Freq: Four times a day (QID) | RESPIRATORY_TRACT | Status: DC | PRN
  Administered 2013-02-20: 2.5 mg via RESPIRATORY_TRACT
  Filled 2013-02-16: qty 1.5
  Filled 2013-02-16: qty 0.5

## 2013-02-16 MED ORDER — GABAPENTIN 100 MG PO CAPS
100.0000 mg | ORAL_CAPSULE | Freq: Two times a day (BID) | ORAL | Status: DC
  Administered 2013-02-16 – 2013-02-19 (×8): 100 mg via ORAL
  Filled 2013-02-16 (×10): qty 1

## 2013-02-16 NOTE — Progress Notes (Signed)
[facility] PROGRESS NOTE  Trevor [name] ZOX:[id number] DOB: Oct 30, 1924 DOA: 01/31/2013 PCP: Cassell Clement, MD  Assessment/Plan: #1 right lower extremity cellulitis Likely secondary to recent trimming of toenails per podiatry with bleeding noted per patient. Clinically improving. Continue empiric IV vancomycin. Elevate lower extremity. Will try Neurontin. Pain management. Follow.  #2 transaminitis/hyperbilirubinemia/jaundice Questionable etiology. May be likely secondary to intrahepatic cholestasis versus gallstones as patient noted to have gallbladder sludge abdominal ultrasound. May also be chemical versus autoimmune versus infectious etiology. Abdominal ultrasound with biliary sludge but negative for acute cholecystitis and normal, bile duct. Patient asymptomatic. LFTs trending down. Total bilirubin today is 5.4 from 7.5. Direct bilirubin is elevated at 3.7. direct bilirubin is also elevated at 1.7. ANA is pending. INR is 1.41. Haptoglobin is at 36. Acute hepatitis panel is pending. GI consultation is pending. Continue to hold statin.  #3 hyponatremia Likely secondary to hypovolemic hyponatremia. Urine sodium was less than 10. Sodium levels improving with gentle hydration. Continue to hold diuretics. Follow.  #4 acute renal insufficiency Likely secondary to prerenal azotemia. Renal function is improving daily. Urine sodium was less than 10. Urinalysis is negative for proteinuria. Continue gentle hydration. Lasix on hold. ARB on hold.  #5 hyperlipidemia Statin on hold secondary to problem #2.  #6 coronary artery disease Stable. Diuretics and ARB on hold secondary to problem #4. Continue aspirin and Plavix.  #7 prophylaxis PPI for GI prophylaxis Lovenox for DVT prophylaxis.  Code Status: DO NOT RESUSCITATE Family Communication: Updated patient no family at bedside. Disposition Plan: Home when medically stable.   Consultants:  Gastroenterology:  Pending  Procedures:  Abdominal ultrasound 01/30/2013  Antibiotics:  IV vancomycin 02/11/2013  HPI/Subjective: Patient complaining of a burning pain in his right lower extremity. Patient denies any chest pain. No shortness of breath.  Objective: Filed Vitals:   02/18/2013 2319 02/16/13 0224 02/16/13 0706 02/16/13 0858  BP: 97/47  98/48   Pulse: 82  82   Temp: 98.2 F (36.8 C)  97.8 F (36.6 C)   TempSrc: Oral  Oral   Resp: 20  20   Height:      Weight:   77.7 kg (171 lb 4.8 oz)   SpO2: 98% 98% 92% 93%    Intake/Output Summary (Last 24 hours) at 02/16/13 0954 Last data filed at 02/16/13 0837  Gross per 24 hour  Intake 1533.75 ml  Output    800 ml  Net 733.75 ml   Filed Weights   01/27/2013 1500 02/16/13 0706  Weight: 74 kg (163 lb 2.3 oz) 77.7 kg (171 lb 4.8 oz)    Exam:   General:  NAD. Scleral icterus improving. Less jaundiced.  Cardiovascular: RRR with 3/6 SEM   Respiratory: right basilar crackles o/w clear  Abdomen: Soft/NT/ND/ reducible abdominal hernia  Extremities: 2+ bilateral lower extremity edema. Right lower extremity with decreased erythema, decreased warmth, tender to palpation.  Data Reviewed: Basic Metabolic Panel:  Recent Labs Lab 02/18/2013 1115 02/03/2013 1702 02/16/13 0421  NA 126*  --  128*  K 5.0  --  4.5  CL 91*  --  97  CO2 29  --  29  GLUCOSE 85  --  103*  BUN 24*  --  22  CREATININE 1.38*  --  1.10  CALCIUM 8.6  --  7.9*  MG  --  2.3  --    Liver Function Tests:  Recent Labs Lab 01/26/2013 1115 02/16/13 0421  AST 141* 110*  ALT 77* 56*  ALKPHOS 148* 127*  BILITOT 7.5* 5.4*  PROT 8.1 6.3  ALBUMIN 1.7* 1.3*    Recent Labs Lab 02/10/2013 1115  LIPASE 60*   No results found for this basename: AMMONIA,  in the last 168 hours CBC:  Recent Labs Lab 01/23/2013 1115 02/16/13 0421  WBC 8.1 8.1  NEUTROABS 6.7  --   HGB 10.9* 9.4*  HCT 30.7* 26.3*  MCV 67.6* 67.6*  PLT 156 144*   Cardiac Enzymes: No results found  for this basename: CKTOTAL, CKMB, CKMBINDEX, TROPONINI,  in the last 168 hours BNP (last 3 results)  Recent Labs  04/25/12 2248 05/14/12 0550 09/12/12 1053  PROBNP 14991.0* 2190.0* 391.0*   CBG: No results found for this basename: GLUCAP,  in the last 168 hours  No results found for this or any previous visit (from the past 240 hour(s)).   Studies: US Abdomen Complete  02/10/2013  *RADIOLOGY REPORT*  Clinical Data:  Abdominal swelling.  Elevated liver function tests.  COMPLETE ABDOMINAL ULTRASOUND  Comparison:  No priors.  Findings:  Gallbladder:  Gallbladder wall appears thickened and edematous measuring up to 7.8 mm.  There is a small amount of echogenic nonshadowing material layering dependently in the lumen of the gallbladder, which likely represents some biliary sludge.  However, the patient does not exhibit a sonographic Murphy's sign on examination at this time.  Common bile duct:  Normal caliber measuring 4.5 mm in the porta hepatis.  Liver:  Heterogeneous in echotexture, generally diffusely hyperechoic, suggesting heterogeneous fatty infiltration. Additionally, the portal triads appears slightly echogenic, which is nonspecific but can be seen in the setting of hepatitis.  No definite focal cystic or solid hepatic lesions.  Trace amount of perihepatic ascites.  IVC:  Appears normal.  Pancreas:  Poorly visualized secondary to overlying bowel gas.  Spleen:  Normal in echotexture and appearance measuring up to 6.3 cm in diameter.  There appears to be a trace amount of perisplenic ascites.  Right Kidney:  No hydronephrosis.  Well-preserved cortex.  Normal size and parenchymal echotexture without focal abnormalities. 9.9 cm in length.  Left Kidney:  No hydronephrosis.  Well-preserved cortex.  Normal size and parenchymal echotexture without focal abnormalities. 9.2 cm in length.  Abdominal aorta:  Measures up to 2 cm in diameter proximally. Poorly visualized distally.  IMPRESSION: 1.  Small amount  of biliary sludge in the gallbladder.  Gallbladder appears mildly edematous.  However, there is no sonographic Murphy's sign on examination per report from the sonographer. Findings are abnormal, but not definitive for acute cholecystitis. Further clinical correlation is recommended. 2.  Heterogeneously hyperechoic hepatic parenchyma suggestive of some degree of fatty infiltration.  Additionally, the portal triads are diffusely echogenic, which can be seen in the setting of hepatitis.  Correlation with liver function tests is recommended. 3.  Trace volume of perihepatic and perisplenic ascites.   Original Report Authenticated By: Trudie Reed, M.D.     Scheduled Meds: . aspirin  81 mg Oral QODAY  . clopidogrel  75 mg Oral Daily  . docusate sodium  100 mg Oral Daily  . enoxaparin (LOVENOX) injection  40 mg Subcutaneous Q24H  . feeding supplement  1 Container Oral Daily  . ferrous gluconate  325 mg Oral Q breakfast  . gabapentin  100 mg Oral BID  . naproxen  250 mg Oral BID WC  . neomycin-bacitracin-polymyxin  1 application Topical Q12H  . pantoprazole  40 mg Oral Daily  . sodium chloride  3 mL Intravenous Q12H  . tiotropium  18  mcg Inhalation Daily  . vancomycin  750 mg Intravenous Q24H   Continuous Infusions: . sodium chloride 75 mL/hr at 02/16/13 6578    Principal Problem:   Cellulitis of leg, right Active Problems:   Ischemic heart disease   Hypercholesterolemia   Iron deficiency anemia   Hx of bladder cancer   Congestive heart failure with left ventricular systolic dysfunction   Ischemic cardiomyopathy   HTN (hypertension)   COPD (chronic obstructive pulmonary disease)   Atrial fibrillation   Transaminitis   Hyperbilirubinemia   Hyponatremia   Acute renal insufficiency    Time spent: > 35 mins    [facility]  [facility] Pager [phone]. If 7PM-7AM, please contact night-coverage at [email or web address], password [facility] 02/16/2013, 9:54 AM  LOS: 1 day

## 2013-02-16 NOTE — Progress Notes (Signed)
  ANTIBIOTIC CONSULT NOTE - FOLLOW UP  Pharmacy Consult for Vancomycin Indication: Cellulitis  Allergies  Allergen Reactions  . Fentanyl And Related Anaphylaxis  . Morphine And Related Anaphylaxis    Cardiac arrest  . Other Other (See Comments)    ALL SEDATIVES;  Lethargic, can hardly function, etc.  . Lisinopril Cough  . Vioxx (Rofecoxib) Other (See Comments)    Lethargic, can hardly function, very hard to wake up    Patient Measurements: Height: 5' (152.4 cm) Weight: 171 lb 4.8 oz (77.7 kg) IBW/kg (Calculated) : 50 Adjusted Body Weight:   Vital Signs: Temp: 97.8 F (36.6 C) (04/28 0706) Temp src: Oral (04/28 0706) BP: 98/48 mmHg (04/28 0706) Pulse Rate: 82 (04/28 0706) Intake/Output from previous day: 04/27 0701 - 04/28 0700 In: 1293.8 [P.O.:480; I.V.:813.8] Out: 300 [Urine:300] Intake/Output from this shift: Total I/O In: 240 [P.O.:240] Out: 500 [Urine:500]  Labs:  Recent Labs  03/19/2013 1115 21-Feb-2013 1655 02/16/13 0421  WBC 8.1  --  8.1  HGB 10.9*  --  9.4*  PLT 156  --  144*  LABCREA  --  100.04  --   CREATININE 1.38*  --  1.10   Estimated Creatinine Clearance: 42.4 ml/min (by C-G formula based on Cr of 1.1). No results found for this basename: VANCOTROUGH, VANCOPEAK, VANCORANDOM, GENTTROUGH, GENTPEAK, GENTRANDOM, TOBRATROUGH, TOBRAPEAK, TOBRARND, AMIKACINPEAK, AMIKACINTROU, AMIKACIN,  in the last 72 hours   Microbiology: No results found for this or any previous visit (from the past 720 hour(s)).  Anti-infectives   Start     Dose/Rate Route Frequency Ordered Stop   02/16/13 1300  vancomycin (VANCOCIN) IVPB 750 mg/150 ml premix     750 mg 150 mL/hr over 60 Minutes Intravenous Every 24 hours 02/25/2013 1646     02/20/2013 1230  vancomycin (VANCOCIN) IVPB 1000 mg/200 mL premix     1,000 mg 200 mL/hr over 60 Minutes Intravenous  Once 03/18/2013 1217 Feb 21, 2013 1411      Assessment: 87 yom w/ RLE cellulitis.  Presents with 12 day hx worsening RLE  erythemia, warmth, tenderness, weeping.  Currently on D#2 Vancomycin.   Dopplers neg for DVT  Tmax: afeb  WBCs: wnl   Renal: Scr improved 1.38-->1.10, CrCl now 42 (CG), 50 (N)  4/27 Urine cx: pending  Goal of Therapy:  Vancomycin trough level 10-15 mcg/ml  Plan:  1.  Increase to Vancomycin 1250mg  IV q 24 hours due to improving renal function.  2.  F/u SCr, WBC, T, cultures, clinical course, Vancomycin trough at steady state.   Darran Gabay E 02/16/2013,9:53 AM

## 2013-02-16 NOTE — Consult Note (Signed)
[facility] Consultation  Referring Provider:   [facility]    Primary Care Physician:  Cassell Clement, MD Primary Gastroenterologist:   None Reason for Consultation :    Elevated LFTs        HPI: Trevor [name] is a 79 y.o. male with multiple significant medical problems including, but not limited to, afib, chronic systolic heart failure, ischemic cardiomyopathy, and COPD. He is on chronic Plavix.  Patient was admitted yesterday after being seen by PCP and sent to ED with RLE cellulitis , jaundice and an abdominal mass on exam.  Patient found to have abnormal LFTs. Ultrasound pertinent for mildly edematous gb containing biliary sludge, heterogeneously hyperechoic hepatic parenchyma suggestive of some degree of fatty infiltration. Additionally, the portal triads are diffusely echogenic, which can be seen in the setting of hepatitis. Trace volume of perihepatic and perisplenic ascites. RLE doppler study negative for DVT.  AST chronically elevated (mild) with remainder of LFTs normal. Yesterday total bili 7.5, AST elevated at 148, AST of to 141 and ALT 77. Numbers better today. Bili down to 5.4, most of which is direct. Patient hasn't had any abdominal pain or nausea. No weight loss. Denies previous history of liver disease. He hasn't started / changed any medications recently. No ETOH use. He was on a statin for years. Lipitor stopped upon admit.  Patient denies any chronic GI problems. He has chronic dyspnea and had orthopnea at last cardiology visit 11/17/12.    Past Medical History  Diagnosis Date  . CAD (coronary artery disease)     Remote inferior MI in 1984, complicated by VFIB requiring multiple shocks, cardiogenic shock and CHF  . CHF (congestive heart failure)     Last echo in 2005 with EF 50 to 55%  . Arrhythmia     Hx of VFIB in 1984  . Abnormal nuclear cardiac imaging test     Lexiscan in Jan 2011 with EF 44% and large area of infarct involving the  inferoseptal basal, inferior and inferolateral wall.   . Iron deficiency anemia   . HTN (hypertension)   . Bladder cancer   . Hip fracture   . Chronic back pain   . Right bundle branch block     Chronic right bundle-branch block.   . History of tobacco abuse   . Cellulitis      Chronic cellulitis right ankle.  . Anemia   . CAD (coronary artery disease)     With remote large inferior MI dating back to 1984 with associated VFib arrest. Was not cathed.   . Dyslipidemia   . Nonhealing nonsurgical wound      Infected, chronic nonhealing wound, right ankle  . Postoperative ileus   . Hypercholesterolemia   . Hyponatremia   . Inguinal hernia     Strangulated recurrent right inguinal hernia  . Depression   . Osteoarthritis     severe  . Chronic constipation   . Palpitations   . Systolic heart failure     EF is 25% per echo in October 2012 with global hypokinesis and posterior and inferior akinesis, as well as grade 2 diastolic dysfunction, moderate TR and mild pulmonary HTN    Past Surgical History  Procedure Laterality Date  . Hernia repair    . Hip surgery    . Ankle surgery      past right ankle fracture    Family History  Problem Relation Age of Onset  . Heart disease Father   . Heart  attack Father   . Hypertension Father   . Emphysema Brother   . Coronary artery disease Sister     X5 CABG still living  . Heart failure Sister   . Hypertension Mother      History  Substance Use Topics  . Smoking status: Former Smoker -- 1.00 packs/day for 26 years    Types: Cigarettes    Quit date: 08/17/1983  . Smokeless tobacco: Former Neurosurgeon    Types: Chew  . Alcohol Use: No    Prior to Admission medications   Medication Sig Start Date End Date Taking? Authorizing Provider  aspirin 81 MG tablet Take 81 mg by mouth every other day.   Yes Historical Provider, MD  atorvastatin (LIPITOR) 20 MG tablet Take 1 tablet (20 mg total) by mouth daily. 06/11/12  Yes Cassell Clement, MD   calcium carbonate (OS-CAL) 600 MG TABS Take 600 mg by mouth 2 (two) times daily with a meal.     Yes Historical Provider, MD  Cholecalciferol (VITAMIN D PO) Take 1,000 Int'l Units by mouth daily.   Yes Historical Provider, MD  clopidogrel (PLAVIX) 75 MG tablet Take 1 tablet (75 mg total) by mouth daily. 07/04/12  Yes Cassell Clement, MD  docusate sodium (COLACE) 100 MG capsule Take 100 mg by mouth daily.   Yes Historical Provider, MD  feeding supplement (ENSURE) PUDG Take 1 Container by mouth daily. 05/19/12  Yes Brooke O Edmisten, PA-C  ferrous gluconate (FERGON) 325 MG tablet Take 1 tablet (325 mg total) by mouth daily with breakfast. 06/11/12  Yes Cassell Clement, MD  furosemide (LASIX) 40 MG tablet Take 40 mg by mouth as directed. 2 in the morning and 2 in the evening - changed 4/25 09/12/12 10/21/22 Yes Cassell Clement, MD  losartan (COZAAR) 50 MG tablet Take 50 mg by mouth daily. Take 1/2 daily   Yes Historical Provider, MD  Multiple Vitamin (MULTIVITAMIN WITH MINERALS) TABS Take 1 tablet by mouth daily.   Yes Historical Provider, MD  naproxen sodium (ANAPROX) 220 MG tablet Take 220 mg by mouth 2 (two) times daily with a meal.   Yes Historical Provider, MD  neomycin-bacitracin-polymyxin (NEOSPORIN) ointment Apply 1 application topically every 12 (twelve) hours. apply to eye   Yes Historical Provider, MD  nitroGLYCERIN (NITROSTAT) 0.4 MG SL tablet Place 1 tablet (0.4 mg total) under the tongue every 5 (five) minutes as needed. For chest pain 06/11/12  Yes Cassell Clement, MD  polyethylene glycol [facility] / GLYCOLAX) packet Take 17 g by mouth as needed (constipation).    Yes Historical Provider, MD  potassium chloride SA (K-DUR,KLOR-CON) 20 MEQ tablet Take 1 tablet (20 mEq total) by mouth 2 (two) times daily. 07/04/12  Yes Cassell Clement, MD  tiotropium (SPIRIVA) 18 MCG inhalation capsule Place 1 capsule (18 mcg total) into inhaler and inhale daily. 07/04/12  Yes Cassell Clement, MD     Current Facility-Administered Medications  Medication Dose Route Frequency Provider Last Rate Last Dose  . 0.9 %  sodium chloride infusion   Intravenous Continuous Rodolph Bong, MD 75 mL/hr at 02/16/13 [phone]    . albuterol (PROVENTIL) (5 MG/ML) 0.5% nebulizer solution 2.5 mg  2.5 mg Nebulization Q6H PRN Rodolph Bong, MD      . alum & mag hydroxide-simeth (MAALOX/MYLANTA) 200-200-20 MG/5ML suspension 30 mL  30 mL Oral Q6H PRN Rodolph Bong, MD      . aspirin chewable tablet 81 mg  81 mg Oral QODAY Rodolph Bong, MD  81 mg at 03-01-13 1805  . clopidogrel (PLAVIX) tablet 75 mg  75 mg Oral Daily Rodolph Bong, MD   75 mg at Mar 01, 2013 1804  . docusate sodium (COLACE) capsule 100 mg  100 mg Oral Daily Rodolph Bong, MD   100 mg at 03-01-2013 1804  . enoxaparin (LOVENOX) injection 40 mg  40 mg Subcutaneous Q24H Rodolph Bong, MD   40 mg at 01-Mar-2013 1805  . feeding supplement (ENSURE) pudding 1 Container  1 Container Oral Daily Rodolph Bong, MD   1 Container at 2013/03/01 1800  . ferrous gluconate (FERGON) tablet 325 mg  325 mg Oral Q breakfast Rodolph Bong, MD   324 mg at 02/16/13 0758  . gabapentin (NEURONTIN) capsule 100 mg  100 mg Oral BID Rodolph Bong, MD      . ibuprofen (ADVIL,MOTRIN) tablet 400 mg  400 mg Oral Q6H PRN Rodolph Bong, MD      . naproxen (NAPROSYN) tablet 250 mg  250 mg Oral BID WC Rodolph Bong, MD   250 mg at 02/16/13 0758  . neomycin-bacitracin-polymyxin (NEOSPORIN) ointment 1 application  1 application Topical Q12H Rodolph Bong, MD   1 application at 2013-03-01 2254  . nitroGLYCERIN (NITROSTAT) SL tablet 0.4 mg  0.4 mg Sublingual Q5 min PRN Rodolph Bong, MD      . ondansetron [facility]) tablet 4 mg  4 mg Oral Q6H PRN Rodolph Bong, MD       Or  . ondansetron [facility]) injection 4 mg  4 mg Intravenous Q6H PRN Rodolph Bong, MD      . pantoprazole (PROTONIX) EC tablet 40 mg  40 mg Oral Daily Rodolph Bong, MD    40 mg at 03-01-2013 1806  . polyethylene glycol (MIRALAX / GLYCOLAX) packet 17 g  17 g Oral PRN Rodolph Bong, MD      . sodium chloride 0.9 % injection 3 mL  3 mL Intravenous Q12H Rodolph Bong, MD   3 mL at 03-01-13 2255  . tiotropium (SPIRIVA) inhalation capsule 18 mcg  18 mcg Inhalation Daily Rodolph Bong, MD   18 mcg at 02/16/13 [phone]  . vancomycin (VANCOCIN) 1,250 mg in sodium chloride 0.9 % 250 mL IVPB  1,250 mg Intravenous Q24H Colleen E Summe, RPH        Allergies as of 03-01-13 - Review Complete Mar 01, 2013  Allergen Reaction Noted  . Fentanyl and related Anaphylaxis 2013/03/01  . Morphine and related Anaphylaxis 03/20/2011  . Other Other (See Comments) 04/23/2012  . Lisinopril Cough 12/19/2011  . Vioxx (rofecoxib) Other (See Comments) 08/16/2011     Review of Systems:    All systems reviewed and negative except where noted in HPI.  PHYSICAL EXAM: Vital signs in last 24 hours: Temp:  [97.6 F (36.4 C)-98.2 F (36.8 C)] 97.8 F (36.6 C) (04/28 0706) Pulse Rate:  [80-90] 82 (04/28 0706) Resp:  [16-20] 20 (04/28 0706) BP: (97-123)/(44-61) 98/48 mmHg (04/28 0706) SpO2:  [92 %-100 %] 93 % (04/28 0858) Weight:  [163 lb 2.3 oz (74 kg)-171 lb 4.8 oz (77.7 kg)] 171 lb 4.8 oz (77.7 kg) (04/28 0706) Last BM Date: 02/14/13 General:   Pleasant white male in NAD Head:  Normocephalic and atraumatic. Eyes:   Icteric schlera.   Conjunctiva pink. Ears:  Normal auditory acuity. Neck:  Supple; no masses felt Lungs:  Respirations slightly labored. Bibasilar inspiratory crackles.   Heart:  Regular rate and rhythm Abdomen:  Soft, nondistended, nontender. Normal bowel sounds. Large, soft ventral hernia. I cannot appreciate any masses. Old mid lower surgical scar  Msk:  Symmetrical without gross deformities.  Extremities:  Pitting edema BLE. RLE with marked erythema / almost purple appearing.  Neurologic:  Alert and  oriented x4;  grossly normal neurologically. Cervical Nodes:   No significant cervical adenopathy. Psych:  Alert and cooperative. Normal affect. Skin: Icteric  LAB RESULTS:  Recent Labs  02/09/2013 1115 02/16/13 0421  WBC 8.1 8.1  HGB 10.9* 9.4*  HCT 30.7* 26.3*  PLT 156 144*   BMET  Recent Labs  01/31/2013 1115 02/16/13 0421  NA 126* 128*  K 5.0 4.5  CL 91* 97  CO2 29 29  GLUCOSE 85 103*  BUN 24* 22  CREATININE 1.38* 1.10  CALCIUM 8.6 7.9*   LFT    Ref. Range 01/30/2013 11:15 02/16/2013 04:21  Alkaline Phosphatase Latest Range: 39-117 U/L 148 (H) 127 (H)  Albumin Latest Range: 3.5-5.2 g/dL 1.7 (L) 1.3 (L)  Lipase Latest Range: 11-59 U/L 60 (H)   AST Latest Range: 0-37 U/L 141 (H) 110 (H)  ALT Latest Range: 0-53 U/L 77 (H) 56 (H)  Total Protein Latest Range: 6.0-8.3 g/dL 8.1 6.3  Bilirubin, Direct Latest Range: 0.0-0.3 mg/dL  3.7 (H)  Indirect Bilirubin Latest Range: 0.3-0.9 mg/dL  1.7 (H)  Total Bilirubin Latest Range: 0.3-1.2 mg/dL 7.5 (H) 5.4 (H)    Recent Labs  02/16/13 0421  PROT 6.3  ALBUMIN 1.3*  AST 110*  ALT 56*  ALKPHOS 127*  BILITOT 5.4*  BILIDIR 3.7*  IBILI 1.7*   PT/INR  Recent Labs  02/13/2013 1702  LABPROT 16.9*  INR 1.41    STUDIES: US Abdomen Complete  02/14/2013  *RADIOLOGY REPORT*  Clinical Data:  Abdominal swelling.  Elevated liver function tests.  COMPLETE ABDOMINAL ULTRASOUND  Comparison:  No priors.  Findings:  Gallbladder:  Gallbladder wall appears thickened and edematous measuring up to 7.8 mm.  There is a small amount of echogenic nonshadowing material layering dependently in the lumen of the gallbladder, which likely represents some biliary sludge.  However, the patient does not exhibit a sonographic Murphy's sign on examination at this time.  Common bile duct:  Normal caliber measuring 4.5 mm in the porta hepatis.  Liver:  Heterogeneous in echotexture, generally diffusely hyperechoic, suggesting heterogeneous fatty infiltration. Additionally, the portal triads appears slightly echogenic,  which is nonspecific but can be seen in the setting of hepatitis.  No definite focal cystic or solid hepatic lesions.  Trace amount of perihepatic ascites.  IVC:  Appears normal.  Pancreas:  Poorly visualized secondary to overlying bowel gas.  Spleen:  Normal in echotexture and appearance measuring up to 6.3 cm in diameter.  There appears to be a trace amount of perisplenic ascites.  Right Kidney:  No hydronephrosis.  Well-preserved cortex.  Normal size and parenchymal echotexture without focal abnormalities. 9.9 cm in length.  Left Kidney:  No hydronephrosis.  Well-preserved cortex.  Normal size and parenchymal echotexture without focal abnormalities. 9.2 cm in length.  Abdominal aorta:  Measures up to 2 cm in diameter proximally. Poorly visualized distally.  IMPRESSION: 1.  Small amount of biliary sludge in the gallbladder.  Gallbladder appears mildly edematous.  However, there is no sonographic Murphy's sign on examination per report from the sonographer. Findings are abnormal, but not definitive for acute cholecystitis. Further clinical correlation is recommended. 2.  Heterogeneously hyperechoic hepatic parenchyma suggestive of some degree of fatty infiltration.  Additionally,  the portal triads are diffusely echogenic, which can be seen in the setting of hepatitis.  Correlation with liver function tests is recommended. 3.  Trace volume of perihepatic and perisplenic ascites.   Original Report Authenticated By: Trudie Reed, M.D.     PREVIOUS ENDOSCOPIES:            None  IMPRESSION / PLAN:        1. Elevated LFTs, jaundice predominantly cholestatic. Biliary sludge on ultrasound but normal CBD. Portal triads diffusely echogenic. No abdominal pain or nausea. LFTs better today. Will obtain a CTscan of the abd / pelvis for better evaluation. Check am LFTs  2. RLE cellulitis. He has a remote history of right ankle cellulitis, s/p hardware removal and I&D 2005. On Vancomycin  3. Chronic microcytic  anemia, MCV has declined further to 67. Hgb down slightly below baseline of 10-11. Will check iron studies. He is on iron at home  3. History of incarcerated right inguinal hernia, s/p hernia repair / partial right colectomy 2004.  4.  Multiple significant medical problems as listed in PMH. On Plavix.   Thanks   LOS: 1 day   Willette Cluster  02/16/2013, 10:24 AM    [facility] Attending  I have also seen and assessed the patient and agree with the above note. Patient examined, history reviewed in person. Labs and old notes reviewed - also viewed [place] images. Cause of jaundice unclear at this time  Additional comments  1) slightly low haptoglobin could indicate some hemolysis but doubt that is main issue 2) I think Right heart failure and cardiac congestion could be playing a role - had signs of RHF on last echo 2013 - will check BNP 3) appropriate to stop Lipitor though I doubt that caused this   Iva Boop, MD, [facility] [phone] (pager) 02/16/2013 12:25 PM

## 2013-02-16 NOTE — Evaluation (Signed)
Physical Therapy Evaluation Patient Details Name: Trevor [name] MRN: [id number] DOB: Dec 23, 1924 Today's Date: 02/16/2013 Time: 4098-1191 PT Time Calculation (min): 22 min  PT Assessment / Plan / Recommendation Clinical Impression  Pt is an 79 year old male admitted for R LE cellulitis.  Pt would benefit from acute PT services in order to improve independence with transfers and ambulation to prepare for safe d/c home with spouse.  Pt agreeable to PT while in hospital but declines HHPT.    PT Assessment  Patient needs continued PT services    Follow Up Recommendations  No PT follow up    Does the patient have the potential to tolerate intense rehabilitation      Barriers to Discharge        Equipment Recommendations  None recommended by PT    Recommendations for Other Services     Frequency Min 3X/week    Precautions / Restrictions Precautions Precautions: None Precaution Comments: R LE weeping   Pertinent Vitals/Pain No c/o pain with mobility SaO2 96% room air upon sitting in recliner after ambulation.      Mobility  Bed Mobility Bed Mobility: Supine to Sit Supine to Sit: 5: Supervision;HOB elevated;With rails Transfers Transfers: Stand to Sit;Sit to Stand Sit to Stand: 4: Min guard;With upper extremity assist;From bed Stand to Sit: 4: Min guard;With upper extremity assist;To chair/3-in-1 Details for Transfer Assistance: verbal cues for safe technique Ambulation/Gait Ambulation/Gait Assistance: 4: Min guard Ambulation Distance (Feet): 180 Feet Assistive device: Rolling walker Ambulation/Gait Assistance Details: verbal cues for RW distance, discussed using RW upon d/c vs rollator for more support while R LE heals, LLD with L longer than R, pt reports shoe lift however left at home Gait Pattern: Step-through pattern;Decreased stride length Gait velocity: decreased    Exercises     PT Diagnosis: Difficulty walking  PT Problem List: Decreased strength;Decreased  activity tolerance;Decreased mobility;Decreased knowledge of use of DME PT Treatment Interventions: DME instruction;Gait training;Functional mobility training;Therapeutic activities;Therapeutic exercise;Patient/family education;Stair training   PT Goals Acute Rehab PT Goals PT Goal Formulation: With patient Time For Goal Achievement: 02/23/13 Potential to Achieve Goals: Good Pt will go Sit to Stand: with modified independence PT Goal: Sit to Stand - Progress: Goal set today Pt will go Stand to Sit: with modified independence PT Goal: Stand to Sit - Progress: Goal set today Pt will Ambulate: 51 - 150 feet;with least restrictive assistive device;with modified independence PT Goal: Ambulate - Progress: Goal set today Pt will Perform Home Exercise Program: with supervision, verbal cues required/provided PT Goal: Perform Home Exercise Program - Progress: Goal set today  Visit Information  Last PT Received On: 02/16/13 Assistance Needed: +1    Subjective Data  Subjective: I don't use the walker around the house. Patient Stated Goal: home   Prior Functioning  Home Living Lives With: Spouse Type of Home: House Home Adaptive Equipment: Walker - rolling;Walker - four wheeled Prior Function Level of Independence: Independent with assistive device(s) Comments: Pt reports no AD ambulating around home but uses walker for community Communication Communication: HOH    Cognition  Cognition Arousal/Alertness: Awake/alert Behavior During Therapy: WFL for tasks assessed/performed Overall Cognitive Status: Within Functional Limits for tasks assessed    Extremity/Trunk Assessment Right Lower Extremity Assessment RLE ROM/Strength/Tone: [facility] for tasks assessed Left Lower Extremity Assessment LLE ROM/Strength/Tone: [facility] for tasks assessed Trunk Assessment Trunk Assessment: Other exceptions Trunk Exceptions: scoliosis, rounded shoulders and forward head posture.   Balance    End of Session PT -  End of Session Activity Tolerance: Patient limited by fatigue Patient left: in chair;with call bell/phone within reach;with family/visitor present  GP     [name],KATHrine E 02/16/2013, 1:41 PM [name], PT, DPT 02/16/2013 Pager: [phone]

## 2013-02-16 NOTE — Progress Notes (Signed)
   CARE MANAGEMENT NOTE 02/16/2013  Patient:  Trevor [name], Trevor [name]   Account Number:  [id number]  Date Initiated:  02/16/2013  Documentation initiated by:  Jiles Crocker  Subjective/Objective Assessment:   ADMITTED WITH CELLULITIS OF BILATERAL LEGS     Action/Plan:   PCP: Cassell Clement, MD  Specialists: Cardiology: Dr. Coralie Common AT HOME WITH SPOUSE; PER PHYSICAL THERAPY NOTES- PT DOES NOT WANT HHPT AT DISCHARGE   Anticipated DC Date:  2013/03/05   Anticipated DC Plan:  HOME/SELF CARE      DC Planning Services  CM consult        Status of service:  In process, will continue to follow Medicare Important Message given?  NA - LOS <3 / Initial given by admissions (If response is "NO", the following Medicare IM given date fields will be blank)  Per UR Regulation:  Reviewed for med. necessity/level of care/duration of stay Comments:  02/16/2013- B Lillar Bianca RN,BSN,MHA

## 2013-02-17 ENCOUNTER — Encounter (HOSPITAL_COMMUNITY): Payer: Self-pay | Admitting: Internal Medicine

## 2013-02-17 ENCOUNTER — Other Ambulatory Visit: Payer: Self-pay | Admitting: *Deleted

## 2013-02-17 DIAGNOSIS — I509 Heart failure, unspecified: Secondary | ICD-10-CM | POA: Diagnosis present

## 2013-02-17 DIAGNOSIS — I5023 Acute on chronic systolic (congestive) heart failure: Secondary | ICD-10-CM

## 2013-02-17 DIAGNOSIS — I369 Nonrheumatic tricuspid valve disorder, unspecified: Secondary | ICD-10-CM

## 2013-02-17 DIAGNOSIS — K761 Chronic passive congestion of liver: Secondary | ICD-10-CM | POA: Diagnosis present

## 2013-02-17 DIAGNOSIS — K7460 Unspecified cirrhosis of liver: Secondary | ICD-10-CM

## 2013-02-17 DIAGNOSIS — I4891 Unspecified atrial fibrillation: Secondary | ICD-10-CM

## 2013-02-17 DIAGNOSIS — R188 Other ascites: Secondary | ICD-10-CM

## 2013-02-17 DIAGNOSIS — I2589 Other forms of chronic ischemic heart disease: Secondary | ICD-10-CM

## 2013-02-17 HISTORY — DX: Unspecified cirrhosis of liver: K74.60

## 2013-02-17 LAB — CBC WITH DIFFERENTIAL/PLATELET
Basophils Relative: 0 % (ref 0–1)
Eosinophils Relative: 1 % (ref 0–5)
Hemoglobin: 9.7 g/dL — ABNORMAL LOW (ref 13.0–17.0)
Lymphs Abs: 1 10*3/uL (ref 0.7–4.0)
MCH: 24.5 pg — ABNORMAL LOW (ref 26.0–34.0)
MCV: 68.4 fL — ABNORMAL LOW (ref 78.0–100.0)
Monocytes Absolute: 0.9 10*3/uL (ref 0.1–1.0)
Neutro Abs: 7.7 10*3/uL (ref 1.7–7.7)
RBC: 3.96 MIL/uL — ABNORMAL LOW (ref 4.22–5.81)

## 2013-02-17 LAB — BASIC METABOLIC PANEL
Chloride: 96 mEq/L (ref 96–112)
GFR calc Af Amer: 68 mL/min — ABNORMAL LOW (ref >90)
GFR calc non Af Amer: 59 mL/min — ABNORMAL LOW (ref >90)
Glucose, Bld: 82 mg/dL (ref 70–99)
Potassium: 5.1 mEq/L (ref 3.5–5.1)
Sodium: 126 mEq/L — ABNORMAL LOW (ref 135–145)

## 2013-02-17 LAB — IRON AND TIBC
Saturation Ratios: 84 % — ABNORMAL HIGH (ref 20–55)
TIBC: 147 ug/dL — ABNORMAL LOW (ref 215–435)
UIBC: 23 ug/dL — ABNORMAL LOW (ref 125–400)

## 2013-02-17 LAB — FERRITIN: Ferritin: 299 ng/mL (ref 22–322)

## 2013-02-17 LAB — RETICULOCYTES: Retic Ct Pct: 3.5 % — ABNORMAL HIGH (ref 0.4–3.1)

## 2013-02-17 LAB — HEPATIC FUNCTION PANEL
AST: 107 U/L — ABNORMAL HIGH (ref 0–37)
Albumin: 1.4 g/dL — ABNORMAL LOW (ref 3.5–5.2)

## 2013-02-17 MED ORDER — LOSARTAN POTASSIUM 25 MG PO TABS
12.5000 mg | ORAL_TABLET | Freq: Every day | ORAL | Status: DC
  Administered 2013-02-17 – 2013-02-19 (×3): 12.5 mg via ORAL
  Filled 2013-02-17 (×4): qty 0.5

## 2013-02-17 MED ORDER — FUROSEMIDE 10 MG/ML IJ SOLN
40.0000 mg | Freq: Two times a day (BID) | INTRAMUSCULAR | Status: DC
  Administered 2013-02-17 (×2): 40 mg via INTRAVENOUS
  Filled 2013-02-17 (×4): qty 4

## 2013-02-17 MED ORDER — LOSARTAN POTASSIUM 50 MG PO TABS
50.0000 mg | ORAL_TABLET | Freq: Every day | ORAL | Status: DC
  Filled 2013-02-17: qty 1

## 2013-02-17 NOTE — Evaluation (Signed)
Occupational Therapy Evaluation Patient Details Name: Trevor [name] MRN: [id number] DOB: 04-Jul-1925 Today's Date: 02/17/2013 Time: 6578-4696 OT Time Calculation (min): 14 min  OT Assessment / Plan / Recommendation Clinical Impression  Pt presents with overall weakness and decreased I with ADL activity s/p hospital admission with cellulitis. Pt will benefit from skilled OT to increase I with ADL actiivity and return to PLOF    OT Assessment  Patient needs continued OT Services    Follow Up Recommendations  Home health OT       Equipment Recommendations  None recommended by OT       Frequency  Min 2X/week    Precautions / Restrictions Precautions Precautions: None Precaution Comments: R LE weeping, Leg length discrepancy, has R lift shoe at home Restrictions Weight Bearing Restrictions: No       ADL  Grooming: Performed;Wash/dry face;Brushing hair;Set up Lower Body Dressing: Performed;Moderate assistance Where Assessed - Lower Body Dressing: Unsupported sit to stand Toilet Transfer: Simulated;Minimal assistance    OT Diagnosis: Generalized weakness  OT Problem List: Decreased activity tolerance;Decreased strength OT Treatment Interventions: Self-care/ADL training;Patient/family education;DME and/or AE instruction   OT Goals Acute Rehab OT Goals OT Goal Formulation: With patient Time For Goal Achievement: 02/24/13 Potential to Achieve Goals: Good ADL Goals Pt Will Perform Grooming: with modified independence;Standing at sink ADL Goal: Grooming - Progress: Goal set today Pt Will Perform Upper Body Bathing: with modified independence;Sit to stand from chair ADL Goal: Upper Body Bathing - Progress: Goal set today Pt Will Perform Lower Body Bathing: with modified independence;Sit to stand from chair ADL Goal: Lower Body Bathing - Progress: Goal set today Pt Will Perform Upper Body Dressing: with supervision;Sit to stand from chair ADL Goal: Upper Body Dressing -  Progress: Goal set today Pt Will Perform Lower Body Dressing: Sit to stand from chair ADL Goal: Lower Body Dressing - Progress: Goal set today Pt Will Transfer to Toilet: Comfort height toilet;with modified independence ADL Goal: Toilet Transfer - Progress: Goal set today Pt Will Perform Toileting - Clothing Manipulation: with modified independence;Standing ADL Goal: Toileting - Clothing Manipulation - Progress: Goal set today  Visit Information  Last OT Received On: 02/17/13 Assistance Needed: +1    Subjective Data  Subjective: I could do everything for my self before this   Prior Functioning     Home Living Lives With: Spouse Type of Home: House Bathroom Shower/Tub:  (pt does a sponge bath only) Home Adaptive Equipment: Walker - rolling;Walker - four wheeled Prior Function Level of Independence: Independent with assistive device(s) Communication Communication: HOH;No difficulties         Vision/Perception Vision - History Patient Visual Report: No change from baseline   Cognition  Cognition Arousal/Alertness: Awake/alert Behavior During Therapy: WFL for tasks assessed/performed Overall Cognitive Status: Within Functional Limits for tasks assessed    Extremity/Trunk Assessment Right Upper Extremity Assessment RUE ROM/Strength/Tone: [facility] for tasks assessed Left Upper Extremity Assessment LUE ROM/Strength/Tone: WFL for tasks assessed     Mobility Bed Mobility Bed Mobility: Supine to Sit Supine to Sit: HOB elevated;With rails;6: Modified independent (Device/Increase time) Transfers Sit to Stand: With upper extremity assist;5: Supervision;From chair/3-in-1 Stand to Sit: With upper extremity assist;To chair/3-in-1;5: Supervision        Balance Balance Balance Assessed: Yes Static Sitting Balance Static Sitting - Balance Support: Feet supported;No upper extremity supported Static Sitting - Level of Assistance: 7: Independent Static Sitting - Comment/# of  Minutes: 4   End of Session OT - End of  Session Activity Tolerance: Patient tolerated treatment well;Patient limited by fatigue Patient left: in chair;with call bell/phone within reach  GO     [facility], [place] 02/17/2013, 9:35 AM

## 2013-02-17 NOTE — Progress Notes (Signed)
Patient ID: ALEM FAHL, male   DOB: Jan 09, 1925, 79 y.o.   MRN: [id number]   CARDIOLOGY CONSULT NOTE  Patient ID: THAMAS APPLEYARD MRN: [id number], DOB/AGE: April 13, 1925   Admit date: 01/27/2013 Date of Consult: 02/17/2013   Primary Physician: Cassell Clement, MD Primary Cardiologist: same  Pt. Profile  Mr. Magnan is a pleasant 79 year old married white male, long-term patient of Dr. 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  Problem List  Past Medical History  Diagnosis Date  . CAD (coronary artery disease)     Remote inferior MI in 1984, complicated by VFIB requiring multiple shocks, cardiogenic shock and CHF  . CHF (congestive heart failure)     Last echo in 2005 with EF 50 to 55%  . Arrhythmia     Hx of VFIB in 1984  . Abnormal nuclear cardiac imaging test     Lexiscan in Jan 2011 with EF 44% and large area of infarct involving the inferoseptal basal, inferior and inferolateral Safina Huard.   . Iron deficiency anemia   . HTN (hypertension)   . Bladder cancer   . Hip fracture   . Chronic back pain   . Right bundle branch block     Chronic right bundle-branch block.   . History of tobacco abuse   . Cellulitis      Chronic cellulitis right ankle.  . Anemia   . CAD (coronary artery disease)     With remote large inferior MI dating back to 1984 with associated VFib arrest. Was not cathed.   . Dyslipidemia   . Nonhealing nonsurgical wound      Infected, chronic nonhealing wound, right ankle  . Postoperative ileus   . Hypercholesterolemia   . Hyponatremia   . Inguinal hernia     Strangulated recurrent right inguinal hernia  . Depression   . Osteoarthritis     severe  . Chronic constipation   . Palpitations   . Systolic heart failure     EF is 25% per echo in October 2012 with global hypokinesis and posterior and inferior akinesis, as well as grade 2 diastolic dysfunction, moderate TR and mild pulmonary HTN  .  NSTEMI (non-ST elevated myocardial infarction) 05/07/2012  . Cirrhosis of liver - by CT and clinical findings 02/17/2013    Past Surgical History  Procedure Laterality Date  . Hernia repair    . Hip surgery    . Ankle surgery      past right ankle fracture     Allergies  Allergies  Allergen Reactions  . Fentanyl And Related Anaphylaxis  . Morphine And Related Anaphylaxis    Cardiac arrest  . Other Other (See Comments)    ALL SEDATIVES;  Lethargic, can hardly function, etc.  . Lisinopril Cough  . Vioxx (Rofecoxib) Other (See Comments)    Lethargic, can hardly function, very hard to wake up    HPI   He was admitted on Sunday with right lower extremity cellulitis. He has been found to have elevated LFTs and radiographic studies have diagnosed cirrhosis. Gastroenterology with Dr. Leone Payor feels like this may be cardiogenic cirrhosis.  He has major swelling in his lower extremities prior to admission. These have gone down some since admission. He is dyspneic at rest.  He has multiple chronic diseases. He has had a history of A. fib but is not a Coumadin candidate because of falls. In addition he had a remote inferior Fiore Detjen MI complicated by cardiogenic shock  and V. fib. His last echocardiogram showed an EF of 25% with inferior akinesia, moderate mitral regurgitation, biatrial enlargement, right ventricular enlargement, tricuspid regurgitation, and pulmonary artery systolic pressure 44 mm mercury. This was in July 2013. He had ventilatory respiratory and cardiac failure on his last hospitalization. He is a DO NOT RESUSCITATE but there is no mention of palliative care.  Inpatient Medications  . aspirin  81 mg Oral QODAY  . clopidogrel  75 mg Oral Daily  . docusate sodium  100 mg Oral Daily  . enoxaparin (LOVENOX) injection  40 mg Subcutaneous Q24H  . feeding supplement  1 Container Oral Daily  . ferrous gluconate  324 mg Oral Q breakfast  . furosemide  40 mg Intravenous Q12H  .  gabapentin  100 mg Oral BID  . naproxen  250 mg Oral BID WC  . neomycin-bacitracin-polymyxin  1 application Topical Q12H  . pantoprazole  40 mg Oral Daily  . sodium chloride  3 mL Intravenous Q12H  . tiotropium  18 mcg Inhalation Daily  . vancomycin  1,250 mg Intravenous Q24H    Family History Family History  Problem Relation Age of Onset  . Heart disease Father   . Heart attack Father   . Hypertension Father   . Emphysema Brother   . Coronary artery disease Sister     X5 CABG still living  . Heart failure Sister   . Hypertension Mother      Social History History   Social History  . Marital Status: Married    Spouse Name: N/A    Number of Children: N/A  . Years of Education: N/A   Occupational History  . Not on file.   Social History Main Topics  . Smoking status: Former Smoker -- 1.00 packs/day for 26 years    Types: Cigarettes    Quit date: 08/17/1983  . Smokeless tobacco: Former Neurosurgeon    Types: Chew  . Alcohol Use: No  . Drug Use: No  . Sexually Active: Not Currently   Other Topics Concern  . Not on file   Social History Narrative  . No narrative on file     Review of Systems  General:  No chills, fever, night sweats or weight changes.  Cardiovascular:  No chest pain,  orthopnea, palpitations, paroxysmal nocturnal dyspnea. Dermatological: Cellulitis of the right lower extremity Respiratory: No cough, dyspnea Urologic: No hematuria, dysuria Abdominal:   No nausea, vomiting, diarrhea, bright red blood per rectum, melena, or hematemesis Neurologic:  No visual changes, wkns, changes in mental status. All other systems reviewed and are otherwise negative except as noted above.  Physical Exam  Blood pressure 104/55, pulse 81, temperature 98.7 F (37.1 C), temperature source Oral, resp. rate 20, height 5' (1.524 m), weight 173 lb 11.6 oz (78.8 kg), SpO2 95.00%.  General: Pleasant, , dyspneic at rest, chronically ill Psych: Normal affect. Neuro: Alert  and oriented X 3. Moves all extremities spontaneously. HEENT: Normal  Neck: Supple without bruits, JVD to the angle of jaw Lungs:  Resp regular and slightly labored, CTA. Heart: Irregular rate and rhythm s3, s4, or murmurs. Abdomen: Soft, non-tender, distended, BS + x 4.  Extremities: No clubbing, cyanosis , chronic brawny changes with overlying cellulitis in the left lower extremity, 2-3+ pitting edema DP/PT/Radials 2+ and equal bilaterally.  Labs  No results found for this basename: CKTOTAL, CKMB, TROPONINI,  in the last 72 hours Lab Results  Component Value Date   WBC 9.7 02/17/2013   HGB  9.7* 02/17/2013   HCT 27.1* 02/17/2013   MCV 68.4* 02/17/2013   PLT 129* 02/17/2013    Recent Labs Lab 02/17/13 0525  NA 126*  K 5.1  CL 96  CO2 27  BUN 19  CREATININE 1.11  CALCIUM 8.0*  PROT 6.6  BILITOT 6.1*  ALKPHOS 142*  ALT 54*  AST 107*  GLUCOSE 82   Lab Results  Component Value Date   CHOL 103 08/06/2012   HDL 51.60 08/06/2012   LDLCALC 40 08/06/2012   TRIG 56.0 08/06/2012   No results found for this basename: DDIMER    Radiology/Studies  US Abdomen Complete  01/24/2013  *RADIOLOGY REPORT*  Clinical Data:  Abdominal swelling.  Elevated liver function tests.  COMPLETE ABDOMINAL ULTRASOUND  Comparison:  No priors.  Findings:  Gallbladder:  Gallbladder Amiliah Campisi appears thickened and edematous measuring up to 7.8 mm.  There is a small amount of echogenic nonshadowing material layering dependently in the lumen of the gallbladder, which likely represents some biliary sludge.  However, the patient does not exhibit a sonographic Murphy's sign on examination at this time.  Common bile duct:  Normal caliber measuring 4.5 mm in the porta hepatis.  Liver:  Heterogeneous in echotexture, generally diffusely hyperechoic, suggesting heterogeneous fatty infiltration. Additionally, the portal triads appears slightly echogenic, which is nonspecific but can be seen in the setting of hepatitis.  No  definite focal cystic or solid hepatic lesions.  Trace amount of perihepatic ascites.  IVC:  Appears normal.  Pancreas:  Poorly visualized secondary to overlying bowel gas.  Spleen:  Normal in echotexture and appearance measuring up to 6.3 cm in diameter.  There appears to be a trace amount of perisplenic ascites.  Right Kidney:  No hydronephrosis.  Well-preserved cortex.  Normal size and parenchymal echotexture without focal abnormalities. 9.9 cm in length.  Left Kidney:  No hydronephrosis.  Well-preserved cortex.  Normal size and parenchymal echotexture without focal abnormalities. 9.2 cm in length.  Abdominal aorta:  Measures up to 2 cm in diameter proximally. Poorly visualized distally.  IMPRESSION: 1.  Small amount of biliary sludge in the gallbladder.  Gallbladder appears mildly edematous.  However, there is no sonographic Murphy's sign on examination per report from the sonographer. Findings are abnormal, but not definitive for acute cholecystitis. Further clinical correlation is recommended. 2.  Heterogeneously hyperechoic hepatic parenchyma suggestive of some degree of fatty infiltration.  Additionally, the portal triads are diffusely echogenic, which can be seen in the setting of hepatitis.  Correlation with liver function tests is recommended. 3.  Trace volume of perihepatic and perisplenic ascites.   Original Report Authenticated By: Trudie Reed, M.D.    Ct Abdomen Pelvis W Contrast  02/16/2013  *RADIOLOGY REPORT*  Clinical Data: Abnormal ultrasound.  Elevated liver function tests.  CT ABDOMEN AND PELVIS WITH CONTRAST  Technique:  Multidetector CT imaging of the abdomen and pelvis was performed following the standard protocol during bolus administration of intravenous contrast.  Contrast: OMNIPAQUE IOHEXOL 300 MG/ML  SOLN  Comparison: Ultrasound 01/21/2013.  Findings: Lung Bases: Small bilateral dependently layering pleural effusions.  Bilateral lower lobe atelectasis.  Cardiomegaly.  Coronary artery and valvular calcifications are incidentally noted. Aortic atherosclerosis.  Scattered areas of patchy density compatible with atelectasis.  Liver:  Study degraded by motion artifact throughout the abdomen and pelvis.  No gross mass lesion is identified.  There is a 7 mm low density lesion in the right hepatic dome, statistically likely right to represent a cyst.  Nodular  contour of the liver suggests hepatic cirrhosis.  Spleen:  Normal.  Accessory splenic tissue near the hilum.  Gallbladder:  Calcified gallstones layering dependently.  Common bile duct:  Allowing for motion artifact, grossly normal. No calcified gallstones in the common bile duct.  Pancreas:  Grossly normal.  Adrenal glands:  Mild thickening suggesting hyperplasia.  Kidneys:  Normal enhancement and delayed excretion of contrast.  No calculi.  Stomach:  Patulous gastroesophageal junction.  Oral contrast in the stomach.  No inflammatory changes are identified.  Small  bowel:   Negative  for  obstruction.   Moderate  volume   of ascites.  No free air.  Laxity of the anterior abdominal Tinaya Ceballos.  Colon:   Appendix not identified.  Colon appears within normal limits.  No obstruction.  Pelvic Genitourinary:  Foley catheter in the urinary bladder. Iatrogenic air with partially decompressed bladder.  Moderate ascites in the pelvis.  No inguinal adenopathy.  No pelvic adenopathy.  Bones:  Thoracolumbar kyphosis with multilevel compression fractures.  Fractures are likely chronic.  Compression fractures are present at every level of the lumbar spine and multiple thoracic compression fractures are present as well.  Retropulsion is most pronounced at L1 with moderate central stenosis.  No paravertebral hematoma.  No aggressive osseous lesions.  Deformity of the proximal left femur compatible with healed fracture.  Vasculature: Atherosclerosis.  Infrarenal abdominal aorta is tortuous and mildly dilated, measuring up to 28 mm.  Anasarca.  There is a  right-sided petite hernia, with extension of ascites into the right flank just inferior to the anterior superior iliac spine (image number [id number].)  IMPRESSION:  1.  Bilateral pleural effusions, cardiomegaly and moderate volume of ascites suggest volume overload.  Anasarca. 2.  Cholelithiasis. 3.   Sub centimeter right hepatic lobe lesion probably represents a cyst.  Nodular contour of the liver compatible with hepatic cirrhosis. 4.  Osteopenia with thoracolumbar kyphosis and multiple compression fractures.  No definite acute osseous abnormality. 5.  Foley catheter in the urinary bladder. 6.  Study technically degraded by respiratory motion throughout the abdomen and pelvis.   Original Report Authenticated By: Andreas Newport, M.D.     ECG None obtained on admission., Last one in January he was in sinus rhythm with right bundle branch like pattern with right ventricular hypertrophy. On telemetry, he appears to be in atrial fibrillation with a well-controlled ventricular rate.   ASSESSMENT AND PLAN  #1  acute on chronic systolic heart failure secondary to ischemic cardiomyopathy. He appears to have cardiac cirrhosis with elevated LFTs as well as some ascites. He now has biventricular failure limited by his right ventricle. #2  paroxysmal A. Fib  He has multiple other comorbidities as outlined by previous notes and I reviewed as well as from his past medical history. Call in to Dr. Patty Sermons to see if he thinks palliative care would be appropriate. I have addressed this with the patient who desires to go home and not be rehospitalized. I've also called his wife and spoke to her and his daughter. They had multiple questions and want to talk to Dr. Patty Sermons. Since he knows them so well I think this is most appropriate. There is really nothing else to to management of his congestive heart failure except  diuresis, watching his BUN and creatinine, and reinitiating his ARB prior to discharge. I spent over an hour  assessing this patient and 30 minutes talking to him and his family.   Signed, Valera Castle, MD 02/17/2013, 5:02 PM

## 2013-02-17 NOTE — Telephone Encounter (Signed)
Called wife yesterday at work around lunch hour, went to [employer]. Went to call her to follow up today and realized patient in the hospital.

## 2013-02-17 NOTE — Progress Notes (Signed)
Echocardiogram 2D Echocardiogram has been performed.  Trevor [name] 02/17/2013, 3:24 PM

## 2013-02-17 NOTE — Progress Notes (Signed)
[facility] PROGRESS NOTE  Collins Kerby Margolis ZOX:[id number] DOB: 1925-09-04 DOA: 02/01/2013 PCP: Cassell Clement, MD  Assessment/Plan: #1 right lower extremity cellulitis Likely secondary to recent trimming of toenails per podiatry with bleeding noted per patient. Clinically improving. Continue empiric IV vancomycin. Elevate lower extremity. Continue Neurontin. Pain management. Follow.  #2 transaminitis/hyperbilirubinemia/jaundice/cardiac cirrhosis Questionable etiology. May be likely secondary to cardiac cirrhosis. Acute hepatitis panel is negative.  Abdominal ultrasound with biliary sludge but negative for acute cholecystitis and normal, bile duct. Patient asymptomatic. LFTs fluctuating. Total bilirubin today is 6.1 from 5.4 from 7.5. Direct bilirubin is elevated at 4.3 from 3.7.  ANA is pending. INR is 1.41. Haptoglobin is at 36. Pro BNP is elevated at 3044. CT of the abdomen and pelvis with bilateral pleural effusions, cardiomegaly and moderate volume of ascites suggestive of volume overload and anasarca. Cholelithiasis. We'll place on IV Lasix. GI following. Cardiac consultation. Will continue to hold statin for now.  #3 acute CHF exacerbation/end-stage CHF Patient with probable end-stage CHF leading to cardiac cirrhosis. Will place patient on IV Lasix. 2-D echo ordered. We'll consult with cardiology for further evaluation and management.  #4 hyponatremia Likely secondary to hypervolemic hyponatremia. Urine sodium was less than 10. Sodium levels started to trend back down. Patient is volume overloaded. Will place on IV Lasix. Follow.   #5 acute renal insufficiency Likely secondary to prerenal azotemia secondary to CHF exacerbation. Renal function is improving daily. Urine sodium was less than 10. Urinalysis is negative for proteinuria. Continue gentle hydration. Will resume diuretics. Will resume ARB.   #6 hyperlipidemia Statin on hold secondary to problem #2.  #7 coronary artery  disease Stable. Will place patient back on IV Lasix 40 every 12 hours. Will resume ARB. Continue aspirin and Plavix.  #8 prophylaxis PPI for GI prophylaxis Lovenox for DVT prophylaxis.  #9 prognosis Patient with end-stage CHF and now with probable cardiac cirrhosis with a poor prognosis may likely benefit from palliative care consultation. Will await input from patient's PCP/cardiologist Dr. Patty Sermons prior to consult in palliative care for goals of care.  Code Status: DO NOT RESUSCITATE Family Communication: Updated patient no family at bedside. Disposition Plan: Home when medically stable.   Consultants:  Gastroenterology: Dr. Leone Payor 02/16/2013  Cardiology: Dr. Daleen Squibb 02/17/2013  Procedures:  Abdominal ultrasound 02/02/2013  2-D echo pending  Antibiotics:  IV vancomycin 02/05/2013  HPI/Subjective: Patient complaining of a burning pain in his right lower extremity. Patient denies any chest pain. No shortness of breath.  Objective: Filed Vitals:   02/17/13 0500 02/17/13 0612 02/17/13 0958 02/17/13 1440  BP:  115/54  104/55  Pulse:  81  81  Temp:  98.6 F (37 C)  98.7 F (37.1 C)  TempSrc:  Oral  Oral  Resp:  18  20  Height:      Weight: 78.8 kg (173 lb 11.6 oz)     SpO2:  95% 93% 95%    Intake/Output Summary (Last 24 hours) at 02/17/13 1843 Last data filed at 02/17/13 1718  Gross per 24 hour  Intake    840 ml  Output   1450 ml  Net   -610 ml   Filed Weights   01/24/2013 1500 02/16/13 0706 02/17/13 0500  Weight: 74 kg (163 lb 2.3 oz) 77.7 kg (171 lb 4.8 oz) 78.8 kg (173 lb 11.6 oz)    Exam:   General:  NAD. Scleral icterus improving. Less jaundiced.  Cardiovascular: RRR with 3/6 SEM   Respiratory: right basilar crackles o/w clear  Abdomen: Soft/NT/ND/ reducible abdominal hernia  Extremities: 2+ bilateral lower extremity edema. Right lower extremity with decreased erythema, decreased warmth, tender to palpation.  Data Reviewed: Basic Metabolic  Panel:  Recent Labs Lab 01/21/2013 1115 01/22/2013 1702 02/16/13 0421 02/17/13 0525  NA 126*  --  128* 126*  K 5.0  --  4.5 5.1  CL 91*  --  97 96  CO2 29  --  29 27  GLUCOSE 85  --  103* 82  BUN 24*  --  22 19  CREATININE 1.38*  --  1.10 1.11  CALCIUM 8.6  --  7.9* 8.0*  MG  --  2.3  --   --    Liver Function Tests:  Recent Labs Lab 01/24/2013 1115 02/16/13 0421 02/17/13 0525  AST 141* 110* 107*  ALT 77* 56* 54*  ALKPHOS 148* 127* 142*  BILITOT 7.5* 5.4* 6.1*  PROT 8.1 6.3 6.6  ALBUMIN 1.7* 1.3* 1.4*    Recent Labs Lab 01/26/2013 1115  LIPASE 60*   No results found for this basename: AMMONIA,  in the last 168 hours CBC:  Recent Labs Lab 01/27/2013 1115 02/16/13 0421 02/17/13 0525  WBC 8.1 8.1 9.7  NEUTROABS 6.7  --  7.7  HGB 10.9* 9.4* 9.7*  HCT 30.7* 26.3* 27.1*  MCV 67.6* 67.6* 68.4*  PLT 156 144* 129*   Cardiac Enzymes: No results found for this basename: CKTOTAL, CKMB, CKMBINDEX, TROPONINI,  in the last 168 hours BNP (last 3 results)  Recent Labs  05/14/12 0550 09/12/12 1053 02/17/13 0525  PROBNP 2190.0* 391.0* 3044.0*   CBG: No results found for this basename: GLUCAP,  in the last 168 hours  Recent Results (from the past 240 hour(s))  URINE CULTURE     Status: None   Collection Time    01/29/2013  4:55 PM      Result Value Range Status   Specimen Description URINE, CATHETERIZED   Final   Special Requests NONE   Final   Culture  Setup Time 02/10/2013 21:38   Final   Colony Count NO GROWTH   Final   Culture NO GROWTH   Final   Report Status 02/16/2013 FINAL   Final     Studies: Ct Abdomen Pelvis W Contrast  02/16/2013  *RADIOLOGY REPORT*  Clinical Data: Abnormal ultrasound.  Elevated liver function tests.  CT ABDOMEN AND PELVIS WITH CONTRAST  Technique:  Multidetector CT imaging of the abdomen and pelvis was performed following the standard protocol during bolus administration of intravenous contrast.  Contrast: OMNIPAQUE IOHEXOL 300  MG/ML  SOLN  Comparison: Ultrasound 02/10/2013.  Findings: Lung Bases: Small bilateral dependently layering pleural effusions.  Bilateral lower lobe atelectasis.  Cardiomegaly. Coronary artery and valvular calcifications are incidentally noted. Aortic atherosclerosis.  Scattered areas of patchy density compatible with atelectasis.  Liver:  Study degraded by motion artifact throughout the abdomen and pelvis.  No gross mass lesion is identified.  There is a 7 mm low density lesion in the right hepatic dome, statistically likely right to represent a cyst.  Nodular contour of the liver suggests hepatic cirrhosis.  Spleen:  Normal.  Accessory splenic tissue near the hilum.  Gallbladder:  Calcified gallstones layering dependently.  Common bile duct:  Allowing for motion artifact, grossly normal. No calcified gallstones in the common bile duct.  Pancreas:  Grossly normal.  Adrenal glands:  Mild thickening suggesting hyperplasia.  Kidneys:  Normal enhancement and delayed excretion of contrast.  No calculi.  Stomach:  Patulous  gastroesophageal junction.  Oral contrast in the stomach.  No inflammatory changes are identified.  Small  bowel:   Negative  for  obstruction.   Moderate  volume   of ascites.  No free air.  Laxity of the anterior abdominal wall.  Colon:   Appendix not identified.  Colon appears within normal limits.  No obstruction.  Pelvic Genitourinary:  Foley catheter in the urinary bladder. Iatrogenic air with partially decompressed bladder.  Moderate ascites in the pelvis.  No inguinal adenopathy.  No pelvic adenopathy.  Bones:  Thoracolumbar kyphosis with multilevel compression fractures.  Fractures are likely chronic.  Compression fractures are present at every level of the lumbar spine and multiple thoracic compression fractures are present as well.  Retropulsion is most pronounced at L1 with moderate central stenosis.  No paravertebral hematoma.  No aggressive osseous lesions.  Deformity of the proximal left  femur compatible with healed fracture.  Vasculature: Atherosclerosis.  Infrarenal abdominal aorta is tortuous and mildly dilated, measuring up to 28 mm.  Anasarca.  There is a right-sided petite hernia, with extension of ascites into the right flank just inferior to the anterior superior iliac spine (image number [id number].)  IMPRESSION:  1.  Bilateral pleural effusions, cardiomegaly and moderate volume of ascites suggest volume overload.  Anasarca. 2.  Cholelithiasis. 3.   Sub centimeter right hepatic lobe lesion probably represents a cyst.  Nodular contour of the liver compatible with hepatic cirrhosis. 4.  Osteopenia with thoracolumbar kyphosis and multiple compression fractures.  No definite acute osseous abnormality. 5.  Foley catheter in the urinary bladder. 6.  Study technically degraded by respiratory motion throughout the abdomen and pelvis.   Original Report Authenticated By: Andreas Newport, M.D.     Scheduled Meds: . aspirin  81 mg Oral QODAY  . clopidogrel  75 mg Oral Daily  . docusate sodium  100 mg Oral Daily  . enoxaparin (LOVENOX) injection  40 mg Subcutaneous Q24H  . feeding supplement  1 Container Oral Daily  . ferrous gluconate  324 mg Oral Q breakfast  . furosemide  40 mg Intravenous Q12H  . gabapentin  100 mg Oral BID  . naproxen  250 mg Oral BID WC  . neomycin-bacitracin-polymyxin  1 application Topical Q12H  . pantoprazole  40 mg Oral Daily  . sodium chloride  3 mL Intravenous Q12H  . tiotropium  18 mcg Inhalation Daily  . vancomycin  1,250 mg Intravenous Q24H   Continuous Infusions:    Principal Problem:   Cardiac cirrhosis Active Problems:   Ischemic heart disease   Hypercholesterolemia   Iron deficiency anemia   Hx of bladder cancer   Congestive heart failure with left ventricular systolic dysfunction   Ischemic cardiomyopathy   HTN (hypertension)   COPD (chronic obstructive pulmonary disease)   Atrial fibrillation   Cellulitis of leg, right   Transaminitis    Hyperbilirubinemia   Hyponatremia   Acute renal insufficiency   Jaundice of recent onset   Ascites   Cirrhosis of liver - by CT and clinical findings   Acute exacerbation of CHF (congestive heart failure)    Time spent: > 35 mins    [facility]  [facility] Pager [phone]. If 7PM-7AM, please contact night-coverage at [email or web address], password [facility] 02/17/2013, 6:43 PM  LOS: 2 days

## 2013-02-17 NOTE — Progress Notes (Signed)
[facility] Progress Note  SUBJECTIVE: feels okay today  OBJECTIVE:  Vital signs in last 24 hours: Temp:  [97.9 F (36.6 C)-98.6 F (37 C)] 98.6 F (37 C) (04/29 0612) Pulse Rate:  [81-85] 81 (04/29 0612) Resp:  [18-20] 18 (04/29 0612) BP: (105-115)/(47-54) 115/54 mmHg (04/29 0612) SpO2:  [94 %-98 %] 95 % (04/29 0612) Weight:  [173 lb 11.6 oz (78.8 kg)] 173 lb 11.6 oz (78.8 kg) (04/29 0500) Last BM Date: 02/14/13 General:    Pleasant white male in NAD Lungs: Respirations even and unlabored, Decreased breath sounds and bibasilar cracklesl Abdomen:  Soft, nontender and nondistended. Normal bowel sounds. Extremities:  Without edema. Neurologic:  Alert and oriented,  grossly normal neurologically. Psych:  Cooperative. Normal mood and affect.   Lab Results:  Recent Labs  03/01/13 1115 02/16/13 0421 02/17/13 0525  WBC 8.1 8.1 9.7  HGB 10.9* 9.4* 9.7*  HCT 30.7* 26.3* 27.1*  PLT 156 144* 129*   BMET  Recent Labs  03-01-2013 1115 02/16/13 0421 02/17/13 0525  NA 126* 128* 126*  K 5.0 4.5 5.1  CL 91* 97 96  CO2 29 29 27   GLUCOSE 85 103* 82  BUN 24* 22 19  CREATININE 1.38* 1.10 1.11  CALCIUM 8.6 7.9* 8.0*   LFT  Recent Labs  02/17/13 0525  PROT 6.6  ALBUMIN 1.4*  AST 107*  ALT 54*  ALKPHOS 142*  BILITOT 6.1*  BILIDIR 4.3*  IBILI 1.8*   PT/INR  Recent Labs  03-01-13 1702  LABPROT 16.9*  INR 1.41    Studies/Results: US Abdomen Complete  01-Mar-2013  *RADIOLOGY REPORT*  Clinical Data:  Abdominal swelling.  Elevated liver function tests.  COMPLETE ABDOMINAL ULTRASOUND  Comparison:  No priors.  Findings:  Gallbladder:  Gallbladder wall appears thickened and edematous measuring up to 7.8 mm.  There is a small amount of echogenic nonshadowing material layering dependently in the lumen of the gallbladder, which likely represents some biliary sludge.  However, the patient does not exhibit a sonographic Murphy's sign on examination at this time.   Common bile duct:  Normal caliber measuring 4.5 mm in the porta hepatis.  Liver:  Heterogeneous in echotexture, generally diffusely hyperechoic, suggesting heterogeneous fatty infiltration. Additionally, the portal triads appears slightly echogenic, which is nonspecific but can be seen in the setting of hepatitis.  No definite focal cystic or solid hepatic lesions.  Trace amount of perihepatic ascites.  IVC:  Appears normal.  Pancreas:  Poorly visualized secondary to overlying bowel gas.  Spleen:  Normal in echotexture and appearance measuring up to 6.3 cm in diameter.  There appears to be a trace amount of perisplenic ascites.  Right Kidney:  No hydronephrosis.  Well-preserved cortex.  Normal size and parenchymal echotexture without focal abnormalities. 9.9 cm in length.  Left Kidney:  No hydronephrosis.  Well-preserved cortex.  Normal size and parenchymal echotexture without focal abnormalities. 9.2 cm in length.  Abdominal aorta:  Measures up to 2 cm in diameter proximally. Poorly visualized distally.  IMPRESSION: 1.  Small amount of biliary sludge in the gallbladder.  Gallbladder appears mildly edematous.  However, there is no sonographic Murphy's sign on examination per report from the sonographer. Findings are abnormal, but not definitive for acute cholecystitis. Further clinical correlation is recommended. 2.  Heterogeneously hyperechoic hepatic parenchyma suggestive of some degree of fatty infiltration.  Additionally, the portal triads are diffusely echogenic, which can be seen in the setting of hepatitis.  Correlation with liver function tests is recommended. 3.  Trace volume of perihepatic and perisplenic ascites.   Original Report Authenticated By: Trudie Reed, M.D.    Ct Abdomen Pelvis W Contrast  02/16/2013  *RADIOLOGY REPORT*  Clinical Data: Abnormal ultrasound.  Elevated liver function tests.  CT ABDOMEN AND PELVIS WITH CONTRAST  Technique:  Multidetector CT imaging of the abdomen and pelvis  was performed following the standard protocol during bolus administration of intravenous contrast.  Contrast: OMNIPAQUE IOHEXOL 300 MG/ML  SOLN  Comparison: Ultrasound 02-25-13.  Findings: Lung Bases: Small bilateral dependently layering pleural effusions.  Bilateral lower lobe atelectasis.  Cardiomegaly. Coronary artery and valvular calcifications are incidentally noted. Aortic atherosclerosis.  Scattered areas of patchy density compatible with atelectasis.  Liver:  Study degraded by motion artifact throughout the abdomen and pelvis.  No gross mass lesion is identified.  There is a 7 mm low density lesion in the right hepatic dome, statistically likely right to represent a cyst.  Nodular contour of the liver suggests hepatic cirrhosis.  Spleen:  Normal.  Accessory splenic tissue near the hilum.  Gallbladder:  Calcified gallstones layering dependently.  Common bile duct:  Allowing for motion artifact, grossly normal. No calcified gallstones in the common bile duct.  Pancreas:  Grossly normal.  Adrenal glands:  Mild thickening suggesting hyperplasia.  Kidneys:  Normal enhancement and delayed excretion of contrast.  No calculi.  Stomach:  Patulous gastroesophageal junction.  Oral contrast in the stomach.  No inflammatory changes are identified.  Small  bowel:   Negative  for  obstruction.   Moderate  volume   of ascites.  No free air.  Laxity of the anterior abdominal wall.  Colon:   Appendix not identified.  Colon appears within normal limits.  No obstruction.  Pelvic Genitourinary:  Foley catheter in the urinary bladder. Iatrogenic air with partially decompressed bladder.  Moderate ascites in the pelvis.  No inguinal adenopathy.  No pelvic adenopathy.  Bones:  Thoracolumbar kyphosis with multilevel compression fractures.  Fractures are likely chronic.  Compression fractures are present at every level of the lumbar spine and multiple thoracic compression fractures are present as well.  Retropulsion is most  pronounced at L1 with moderate central stenosis.  No paravertebral hematoma.  No aggressive osseous lesions.  Deformity of the proximal left femur compatible with healed fracture.  Vasculature: Atherosclerosis.  Infrarenal abdominal aorta is tortuous and mildly dilated, measuring up to 28 mm.  Anasarca.  There is a right-sided petite hernia, with extension of ascites into the right flank just inferior to the anterior superior iliac spine (image number 53.)  IMPRESSION:  1.  Bilateral pleural effusions, cardiomegaly and moderate volume of ascites suggest volume overload.  Anasarca. 2.  Cholelithiasis. 3.   Sub centimeter right hepatic lobe lesion probably represents a cyst.  Nodular contour of the liver compatible with hepatic cirrhosis. 4.  Osteopenia with thoracolumbar kyphosis and multiple compression fractures.  No definite acute osseous abnormality. 5.  Foley catheter in the urinary bladder. 6.  Study technically degraded by respiratory motion throughout the abdomen and pelvis.   Original Report Authenticated By: Andreas Newport, M.D.      ASSESSMENT / PLAN:  1. Elevated LFTs, jaundice predominantly cholestatic. Cholelithiasis on CTscan. CBD grossly normal (allowing for artifact). No abdominal pain or nausea. Slightly more cholestatic overnight.  Choledocholithiasis not excluded but clinically this doesn't seem to be the case. CTscan does show ascites, pleural effusions and cirrhosis. His viral hep panel was negative. 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 Abnormal LFTs could be from  congestive hepatopathy, his BNP is 3044.   Not pursuing diagnostic paracentesis, patient is on plavix. Will order 2gm sodium diet. Will check AFP and am LFTs  2. RLE cellulitis. He has a remote history of right ankle cellulitis, s/p hardware removal and I&D 2005. On Vancomycin   3. Chronic microcytic anemia, MCV has declined further to 67. Hgb down slightly below baseline of 10-11. Iron studies pending  4.History of  incarcerated right inguinal hernia, s/p hernia repair / partial right colectomy 2004  5. Hyponatremia, hospitalist managing.   6. Multiple medical problems    LOS: 2 days   Willette Cluster  02/17/2013, 9:11 AM   [facility] Attending  I have also seen and assessed the patient and agree with the above note.  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 Would treat this more and consider cardiology evaluation in house for acute/chronic heart failure.  Ferritin is 299, Iron sat high so microcytic anemia apparently not low Fe or is a mixed picture (chronic dz/acutre phase/fe defic). Not "scopable" at this point.  I have explained this all to patient (except anemia).  Will check some basic liver serologies for completeness (infectious eval) if not done.  Iva Boop, MD, [facility] [phone] (pager) 02/17/2013 2:41 PM

## 2013-02-17 NOTE — Progress Notes (Signed)
Physical Therapy Treatment Patient Details Name: Trevor [name] MRN: [id number] DOB: 22-Aug-1925 Today's Date: 02/17/2013 Time: 4098-1191 PT Time Calculation (min): 20 min  PT Assessment / Plan / Recommendation Comments on Treatment Session  Pt is progressing well with mobility, he walked 200' with RW and supervision today. Mild dyspnea noted with walking, pt states this is baseline and is due to large hernia. SaO2 97% on RA, HR 107 immediately after walking.     Follow Up Recommendations  No PT follow up     Does the patient have the potential to tolerate intense rehabilitation     Barriers to Discharge        Equipment Recommendations  None recommended by PT    Recommendations for Other Services    Frequency Min 3X/week   Plan Discharge plan remains appropriate;Frequency remains appropriate    Precautions / Restrictions Precautions Precautions: None Precaution Comments: R LE weeping, Leg length discrepancy, has R lift shoe at home Restrictions Weight Bearing Restrictions: No   Pertinent Vitals/Pain **SaO2 97% on RA, HR 107 immediately after walking Pt premedicated for RLE pain*    Mobility  Bed Mobility Bed Mobility: Supine to Sit Supine to Sit: HOB elevated;With rails;6: Modified independent (Device/Increase time) Transfers Transfers: Stand to Sit;Sit to Stand Sit to Stand: With upper extremity assist;From bed;6: Modified independent (Device/Increase time) Stand to Sit: With upper extremity assist;To chair/3-in-1;6: Modified independent (Device/Increase time) Ambulation/Gait Ambulation/Gait Assistance: 5: Supervision Ambulation Distance (Feet): 200 Feet Assistive device: Rolling walker Ambulation/Gait Assistance Details: steady with RW, no LOB, pt reports no h/o falls at home, 1-2/4 dyspnea noted with ambulation, pt states this is due to large hernia. SaO2 97% on RA, HR 107.  Gait Pattern: Step-through pattern;Decreased stride length    Exercises     PT  Diagnosis:    PT Problem List:   PT Treatment Interventions:     PT Goals Acute Rehab PT Goals PT Goal Formulation: With patient Time For Goal Achievement: 02/23/13 Potential to Achieve Goals: Good Pt will go Sit to Stand: with modified independence PT Goal: Sit to Stand - Progress: Met Pt will go Stand to Sit: with modified independence PT Goal: Stand to Sit - Progress: Met Pt will Ambulate: 51 - 150 feet;with least restrictive assistive device;with modified independence PT Goal: Ambulate - Progress: Progressing toward goal Pt will Perform Home Exercise Program: with supervision, verbal cues required/provided  Visit Information  Last PT Received On: 02/17/13 Assistance Needed: +1    Subjective Data      Cognition  Cognition Arousal/Alertness: Awake/alert Behavior During Therapy: WFL for tasks assessed/performed Overall Cognitive Status: Within Functional Limits for tasks assessed    Balance  Balance Balance Assessed: Yes Static Sitting Balance Static Sitting - Balance Support: Feet supported;No upper extremity supported Static Sitting - Level of Assistance: 7: Independent Static Sitting - Comment/# of Minutes: 4  End of Session PT - End of Session Activity Tolerance: Patient tolerated treatment well Patient left: in chair;with call bell/phone within reach (with BLEs elevated) Nurse Communication: Mobility status   GP     Trevor [name] 02/17/2013, 9:30 AM [phone]

## 2013-02-18 DIAGNOSIS — R0609 Other forms of dyspnea: Secondary | ICD-10-CM

## 2013-02-18 DIAGNOSIS — Z515 Encounter for palliative care: Secondary | ICD-10-CM

## 2013-02-18 DIAGNOSIS — I5023 Acute on chronic systolic (congestive) heart failure: Secondary | ICD-10-CM | POA: Diagnosis present

## 2013-02-18 LAB — CBC
Hemoglobin: 9.4 g/dL — ABNORMAL LOW (ref 13.0–17.0)
MCHC: 33.8 g/dL (ref 30.0–36.0)
Platelets: 130 10*3/uL — ABNORMAL LOW (ref 150–400)
RBC: 3.96 MIL/uL — ABNORMAL LOW (ref 4.22–5.81)

## 2013-02-18 LAB — COMPREHENSIVE METABOLIC PANEL
CO2: 26 mEq/L (ref 19–32)
Calcium: 8.1 mg/dL — ABNORMAL LOW (ref 8.4–10.5)
Chloride: 95 mEq/L — ABNORMAL LOW (ref 96–112)
Creatinine, Ser: 1.05 mg/dL (ref 0.50–1.35)
GFR calc Af Amer: 73 mL/min — ABNORMAL LOW (ref >90)
GFR calc non Af Amer: 63 mL/min — ABNORMAL LOW (ref >90)
Glucose, Bld: 90 mg/dL (ref 70–99)
Total Bilirubin: 6.6 mg/dL — ABNORMAL HIGH (ref 0.3–1.2)

## 2013-02-18 LAB — PRO B NATRIURETIC PEPTIDE: Pro B Natriuretic peptide (BNP): 4538 pg/mL — ABNORMAL HIGH (ref 0–450)

## 2013-02-18 MED ORDER — FUROSEMIDE 10 MG/ML IJ SOLN
80.0000 mg | Freq: Two times a day (BID) | INTRAMUSCULAR | Status: DC
  Administered 2013-02-18 – 2013-02-19 (×4): 80 mg via INTRAVENOUS
  Filled 2013-02-18 (×8): qty 8

## 2013-02-18 MED ORDER — BISACODYL 10 MG RE SUPP
10.0000 mg | Freq: Once | RECTAL | Status: AC
  Administered 2013-02-18: 10 mg via RECTAL
  Filled 2013-02-18: qty 1

## 2013-02-18 MED ORDER — DOXYCYCLINE HYCLATE 100 MG PO TABS
100.0000 mg | ORAL_TABLET | Freq: Two times a day (BID) | ORAL | Status: DC
  Administered 2013-02-19 (×2): 100 mg via ORAL
  Filled 2013-02-18 (×4): qty 1

## 2013-02-18 NOTE — Progress Notes (Addendum)
Thank you for consulting the Palliative Medicine Team at [facility] to meet your patient's and family's needs.  The reason that you asked [place] to see your patient is for GOC. We have scheduled your patient for a meeting today 4/30 @ 5pm (due to wife and dtr working until that time) Your patient is able to participate in the meeting.   His surrogate decision maker is his wife:  Reynol, Arnone [phone]    [phone]   And Daughter York Ram,  [phone] (and son in law)  Additional Narrative:RLE cellulitis Likely secondary to recent trimming of toenails per podiatry with bleeding noted per patient;  transaminitis/hyperbilirubinemia/jaundice/cardiac cirrhosis;  acute CHF exacerbation/end-stage CHF, hyponatremia, acute renal insufficiency, hyperlipidemia, CAD.   Followed by Cardiology (Dr. Patty Sermons on vacation-primary cardiologist), and GI.  Please call PMT phone @ [phone] with any questions.  Thank you. Chalmers Cater, RN   Palliative Medicine Team RN Liaison [phone]

## 2013-02-18 NOTE — Progress Notes (Signed)
02/18/13 1558 MD notified for BP 101/40 HR 70 O2 100% 2L.

## 2013-02-18 NOTE — Progress Notes (Signed)
[facility] Progress Note  SUBJECTIVE: states he isn't breathing well   OBJECTIVE:  Vital signs in last 24 hours: Temp:  [97.9 F (36.6 C)-98.7 F (37.1 C)] 98.2 F (36.8 C) (04/30 0448) Pulse Rate:  [81-97] 90 (04/30 0944) Resp:  [20] 20 (04/30 0448) BP: (101-111)/(51-56) 101/56 mmHg (04/30 0944) SpO2:  [95 %-97 %] 97 % (04/30 0857) Weight:  [173 lb 4.5 oz (78.6 kg)] 173 lb 4.5 oz (78.6 kg) (04/30 0448) Last BM Date: 02/14/13 General:    white male in NAD Lungs: Respirations even and unlabored, decreased breath sounds at bases.  Abdomen:  Soft, nontender and nondistended. Normal bowel sounds. Extremities:  2+ BLE edema. RLE less erythematous today.  Neurologic:  Alert and oriented,  grossly normal neurologically. Psych:  Cooperative. Normal mood and affect.   Lab Results:  Recent Labs  02/16/13 0421 02/17/13 0525 02/18/13 0515  WBC 8.1 9.7 9.2  HGB 9.4* 9.7* 9.4*  HCT 26.3* 27.1* 27.8*  PLT 144* 129* 130*   BMET  Recent Labs  02/16/13 0421 02/17/13 0525 02/18/13 0515  NA 128* 126* 127*  K 4.5 5.1 4.2  CL 97 96 95*  CO2 29 27 26   GLUCOSE 103* 82 90  BUN 22 19 23   CREATININE 1.10 1.11 1.05  CALCIUM 7.9* 8.0* 8.1*   LFT  Recent Labs  02/17/13 0525 02/18/13 0515  PROT 6.6 6.7  ALBUMIN 1.4* 1.2*  AST 107* 136*  ALT 54* 57*  ALKPHOS 142* 130*  BILITOT 6.1* 6.6*  BILIDIR 4.3*  --   IBILI 1.8*  --    PT/INR  Recent Labs  02/28/2013 1702  LABPROT 16.9*  INR 1.41    Studies/Results: Ct Abdomen Pelvis W Contrast  02/16/2013  *RADIOLOGY REPORT*  Clinical Data: Abnormal ultrasound.  Elevated liver function tests.  CT ABDOMEN AND PELVIS WITH CONTRAST  Technique:  Multidetector CT imaging of the abdomen and pelvis was performed following the standard protocol during bolus administration of intravenous contrast.  Contrast: OMNIPAQUE IOHEXOL 300 MG/ML  SOLN  Comparison: Ultrasound 03/10/2013.  Findings: Lung Bases: Small bilateral  dependently layering pleural effusions.  Bilateral lower lobe atelectasis.  Cardiomegaly. Coronary artery and valvular calcifications are incidentally noted. Aortic atherosclerosis.  Scattered areas of patchy density compatible with atelectasis.  Liver:  Study degraded by motion artifact throughout the abdomen and pelvis.  No gross mass lesion is identified.  There is a 7 mm low density lesion in the right hepatic dome, statistically likely right to represent a cyst.  Nodular contour of the liver suggests hepatic cirrhosis.  Spleen:  Normal.  Accessory splenic tissue near the hilum.  Gallbladder:  Calcified gallstones layering dependently.  Common bile duct:  Allowing for motion artifact, grossly normal. No calcified gallstones in the common bile duct.  Pancreas:  Grossly normal.  Adrenal glands:  Mild thickening suggesting hyperplasia.  Kidneys:  Normal enhancement and delayed excretion of contrast.  No calculi.  Stomach:  Patulous gastroesophageal junction.  Oral contrast in the stomach.  No inflammatory changes are identified.  Small  bowel:   Negative  for  obstruction.   Moderate  volume   of ascites.  No free air.  Laxity of the anterior abdominal wall.  Colon:   Appendix not identified.  Colon appears within normal limits.  No obstruction.  Pelvic Genitourinary:  Foley catheter in the urinary bladder. Iatrogenic air with partially decompressed bladder.  Moderate ascites in the pelvis.  No inguinal adenopathy.  No pelvic adenopathy.  Bones:  Thoracolumbar kyphosis with multilevel compression fractures.  Fractures are likely chronic.  Compression fractures are present at every level of the lumbar spine and multiple thoracic compression fractures are present as well.  Retropulsion is most pronounced at L1 with moderate central stenosis.  No paravertebral hematoma.  No aggressive osseous lesions.  Deformity of the proximal left femur compatible with healed fracture.  Vasculature: Atherosclerosis.  Infrarenal  abdominal aorta is tortuous and mildly dilated, measuring up to 28 mm.  Anasarca.  There is a right-sided petite hernia, with extension of ascites into the right flank just inferior to the anterior superior iliac spine (image number 53.)  IMPRESSION:  1.  Bilateral pleural effusions, cardiomegaly and moderate volume of ascites suggest volume overload.  Anasarca. 2.  Cholelithiasis. 3.   Sub centimeter right hepatic lobe lesion probably represents a cyst.  Nodular contour of the liver compatible with hepatic cirrhosis. 4.  Osteopenia with thoracolumbar kyphosis and multiple compression fractures.  No definite acute osseous abnormality. 5.  Foley catheter in the urinary bladder. 6.  Study technically degraded by respiratory motion throughout the abdomen and pelvis.   Original Report Authenticated By: Andreas Newport, M.D.      ASSESSMENT / PLAN:  1. Cirrhosis, suspect cardiac. Cholestasis, which continues to worsen, may be from congestive hepatopathy. BNP up to 4538, he is getting Lasix  2. Multiple medical problems. Dr. Daleen Squibb had lengthy conversation with patient and family last night. Patient has multiple co-morbidities with acute on chronic heart failure. There was discussion of asking Palliative Care to see patient for goals of care. Patient well known to Dr. Patty Sermons and family would like to further discuss with him.  3. Microcytic anemia, iron studies not suggestive of iron deficiency. Regardless, work up of anemia not feasible at this point.   4. Increased work of breathing. He is fluid overloaded but some 02 may ease work of breathing. Oxygen per [place] ordered.   LOS: 3 days   Willette Cluster  02/18/2013, 10:10 AM   [facility] Attending  I have also seen and assessed the patient and agree with the above note. Will sign off as I have no new recommendations or ways to help him at this time.  Iva Boop, MD, [facility] [phone] (pager) 02/18/2013 3:41 PM

## 2013-02-18 NOTE — Progress Notes (Signed)
[facility] PROGRESS NOTE  Trevor [name] WGN:[id number] DOB: October 13, 1925 DOA: 02/07/2013 PCP: Cassell Clement, MD  Interval history 79 year old male patient with history of CAD, remote MI, cardiogenic shock, chronic systolic CHF, HTN, HL, iron deficiency anemia was admitted on 01/31/2013 with worsening right lower extremity erythema and pain. He recently had intervention by his podiatrist on right first toenail. In the ED, Dopplers were negative for DVT. He was admitted for RLE cellulitis, abnormal LFTs and hyponatremia. [place] [facility] GI have consulted. It is felt that patient probably has cardiac cirrhosis and paroxysmal A. fib. Palliative care consultation was suggested- called on 4/30.  Assessment/Plan: 1. Right lower extremity cellulitis: Likely secondary to recent trimming of toenails per podiatry with bleeding noted per patient. Clinically improving. Elevate lower extremity. Continue Neurontin. Pain management. Patient was treated with IV vancomycin (completed 4 days). Improving. Will transition to oral doxycycline beginning 5/1. 2. Transaminitis/hyperbilirubinemia/jaundice/cardiac cirrhosis: May be secondary to cardiac cirrhosis. Acute hepatitis panel is negative.  Abdominal ultrasound with biliary sludge but negative for acute cholecystitis and normal, bile duct. Patient asymptomatic. LFTs fluctuating. ANA is pending. INR is 1.41. Haptoglobin is at 36. Pro BNP is elevated at 3044. CT of the abdomen and pelvis with bilateral pleural effusions, cardiomegaly and moderate volume of ascites suggestive of volume overload and anasarca. Cholelithiasis. GI and cardiology consultation appreciated. IV Lasix increased on 4/30. 3. Acute on chronic systolic CHF secondary to ischemic cardiomyopathy: Patient with probable end-stage CHF leading to cardiac cirrhosis. Cardiology input appreciated. Lasix increased on 4/30. LVEF 35%. 4. Hyponatremia: Likely secondary to hypervolemic hyponatremia. Urine  sodium was less than 10. Sodium levels started to trend back down. Patient is volume overloaded. Continue IV Lasix 5. Acute renal insufficiency: Likely secondary to prerenal azotemia secondary to CHF exacerbation. Resolved. 6. Hyperlipidemia: Statin on hold secondary to problem #2. 7. Coronary artery disease: Stable. Continue aspirin and Plavix. 8. PAF: 9. Anemia and thrombocytopenia: Stable. Anemia probably secondary to chronic disease. Thrombocytopenia? Secondary to cirrhosis. 10. Prophylaxis: PPI for GI prophylaxis Lovenox for DVT prophylaxis. 11. Prognosis: Patient with end-stage CHF and now with probable cardiac cirrhosis with a poor prognosis may likely benefit from palliative care consultation. Patient's PCP/cardiologist Dr. Patty Sermons currently on vacation.Palliative Team consulted for GOC- spouse agrees and discussed with rounding Cardiologist.  Code Status: DO NOT RESUSCITATE Family Communication: Discussed with patient spouse.[name] Disposition Plan: Home when medically stable.   Consultants:  Gastroenterology  Cardiology  Procedures:  Abdominal ultrasound 02/10/2013  2-D echo pending  Antibiotics:  IV vancomycin 02/10/2013- 4/30  PO Doxycycline 5/1>  HPI/Subjective: Patient complains of some dyspnea. No chest pain. Right leg pain, redness and swelling have decreased.  Objective: Filed Vitals:   02/17/13 2112 02/18/13 0448 02/18/13 0857 02/18/13 0944  BP: 111/52 105/51  101/56  Pulse: 83 97  90  Temp: 97.9 F (36.6 C) 98.2 F (36.8 C)    TempSrc: Oral Oral    Resp: 20 20    Height:      Weight:  78.6 kg (173 lb 4.5 oz)    SpO2: 95% 96% 97%     Intake/Output Summary (Last 24 hours) at 02/18/13 1431 Last data filed at 02/18/13 1110  Gross per 24 hour  Intake    730 ml  Output   2300 ml  Net  -1570 ml   Filed Weights   02/16/13 0706 02/17/13 0500 02/18/13 0448  Weight: 77.7 kg (171 lb 4.8 oz) 78.8 kg (173 lb 11.6 oz) 78.6 kg (173 lb 4.5  oz)     Exam:   General:  Sitting propped up in bed with mild increased work of breathing. Scleral icterus.  Cardiovascular: RRR with 3/6 SEM. JVD + and pedal edema 1+. Telemetry shows sinus rhythm with first degree AV block and paroxysmal A. fib.   Respiratory: Reduced breath sounds in the bases with few fine basal crackles. Rest of lung fields clear to auscultation. Mild increased work of breathing. Still able to speak in full sentences.  Abdomen: Soft/NT/ND/ reducible abdominal hernia. Normal bowel sounds heard.  Extremities: 1+ bilateral lower extremity edema. Right lower extremity with decreased erythema, decreased warmth, and nontender to palpation.  Data Reviewed: Basic Metabolic Panel:  Recent Labs Lab 02/10/2013 1115 02/16/2013 1702 02/16/13 0421 02/17/13 0525 02/18/13 0515  NA 126*  --  128* 126* 127*  K 5.0  --  4.5 5.1 4.2  CL 91*  --  97 96 95*  CO2 29  --  29 27 26   GLUCOSE 85  --  103* 82 90  BUN 24*  --  22 19 23   CREATININE 1.38*  --  1.10 1.11 1.05  CALCIUM 8.6  --  7.9* 8.0* 8.1*  MG  --  2.3  --   --   --    Liver Function Tests:  Recent Labs Lab 01/29/2013 1115 02/16/13 0421 02/17/13 0525 02/18/13 0515  AST 141* 110* 107* 136*  ALT 77* 56* 54* 57*  ALKPHOS 148* 127* 142* 130*  BILITOT 7.5* 5.4* 6.1* 6.6*  PROT 8.1 6.3 6.6 6.7  ALBUMIN 1.7* 1.3* 1.4* 1.2*    Recent Labs Lab 01/30/2013 1115  LIPASE 60*   No results found for this basename: AMMONIA,  in the last 168 hours CBC:  Recent Labs Lab 02/16/2013 1115 02/16/13 0421 02/17/13 0525 02/18/13 0515  WBC 8.1 8.1 9.7 9.2  NEUTROABS 6.7  --  7.7  --   HGB 10.9* 9.4* 9.7* 9.4*  HCT 30.7* 26.3* 27.1* 27.8*  MCV 67.6* 67.6* 68.4* 70.2*  PLT 156 144* 129* 130*   Cardiac Enzymes: No results found for this basename: CKTOTAL, CKMB, CKMBINDEX, TROPONINI,  in the last 168 hours BNP (last 3 results)  Recent Labs  09/12/12 1053 02/17/13 0525 02/18/13 0515  PROBNP 391.0* 3044.0* 4538.0*    CBG: No results found for this basename: GLUCAP,  in the last 168 hours  Recent Results (from the past 240 hour(s))  URINE CULTURE     Status: None   Collection Time    02/13/2013  4:55 PM      Result Value Range Status   Specimen Description URINE, CATHETERIZED   Final   Special Requests NONE   Final   Culture  Setup Time 01/23/2013 21:38   Final   Colony Count NO GROWTH   Final   Culture NO GROWTH   Final   Report Status 02/16/2013 FINAL   Final     Studies: Ct Abdomen Pelvis W Contrast  02/16/2013  *RADIOLOGY REPORT*  Clinical Data: Abnormal ultrasound.  Elevated liver function tests.  CT ABDOMEN AND PELVIS WITH CONTRAST  Technique:  Multidetector CT imaging of the abdomen and pelvis was performed following the standard protocol during bolus administration of intravenous contrast.  Contrast: OMNIPAQUE IOHEXOL 300 MG/ML  SOLN  Comparison: Ultrasound 02/16/2013.  Findings: Lung Bases: Small bilateral dependently layering pleural effusions.  Bilateral lower lobe atelectasis.  Cardiomegaly. Coronary artery and valvular calcifications are incidentally noted. Aortic atherosclerosis.  Scattered areas of patchy density compatible with atelectasis.  Liver:  Study degraded by motion artifact throughout the abdomen and pelvis.  No gross mass lesion is identified.  There is a 7 mm low density lesion in the right hepatic dome, statistically likely right to represent a cyst.  Nodular contour of the liver suggests hepatic cirrhosis.  Spleen:  Normal.  Accessory splenic tissue near the hilum.  Gallbladder:  Calcified gallstones layering dependently.  Common bile duct:  Allowing for motion artifact, grossly normal. No calcified gallstones in the common bile duct.  Pancreas:  Grossly normal.  Adrenal glands:  Mild thickening suggesting hyperplasia.  Kidneys:  Normal enhancement and delayed excretion of contrast.  No calculi.  Stomach:  Patulous gastroesophageal junction.  Oral contrast in the stomach.  No  inflammatory changes are identified.  Small  bowel:   Negative  for  obstruction.   Moderate  volume   of ascites.  No free air.  Laxity of the anterior abdominal wall.  Colon:   Appendix not identified.  Colon appears within normal limits.  No obstruction.  Pelvic Genitourinary:  Foley catheter in the urinary bladder. Iatrogenic air with partially decompressed bladder.  Moderate ascites in the pelvis.  No inguinal adenopathy.  No pelvic adenopathy.  Bones:  Thoracolumbar kyphosis with multilevel compression fractures.  Fractures are likely chronic.  Compression fractures are present at every level of the lumbar spine and multiple thoracic compression fractures are present as well.  Retropulsion is most pronounced at L1 with moderate central stenosis.  No paravertebral hematoma.  No aggressive osseous lesions.  Deformity of the proximal left femur compatible with healed fracture.  Vasculature: Atherosclerosis.  Infrarenal abdominal aorta is tortuous and mildly dilated, measuring up to 28 mm.  Anasarca.  There is a right-sided petite hernia, with extension of ascites into the right flank just inferior to the anterior superior iliac spine (image number [id number].)  IMPRESSION:  1.  Bilateral pleural effusions, cardiomegaly and moderate volume of ascites suggest volume overload.  Anasarca. 2.  Cholelithiasis. 3.   Sub centimeter right hepatic lobe lesion probably represents a cyst.  Nodular contour of the liver compatible with hepatic cirrhosis. 4.  Osteopenia with thoracolumbar kyphosis and multiple compression fractures.  No definite acute osseous abnormality. 5.  Foley catheter in the urinary bladder. 6.  Study technically degraded by respiratory motion throughout the abdomen and pelvis.   Original Report Authenticated By: Andreas Newport, M.D.    2-D echo Study Conclusions  - Left ventricle: Technically limited study. Severe hypokinesis of the inferior wall. The cavity size was mildly to moderately dilated. The  estimated ejection fraction was 35%. Findings consistent with left ventricular diastolic dysfunction. - Aortic valve: Sclerosis without stenosis. - Left atrium: The atrium was moderately to severely dilated. - Right ventricle: The cavity size was mildly dilated. Systolic function was normal.   Scheduled Meds: . aspirin  81 mg Oral QODAY  . docusate sodium  100 mg Oral Daily  . enoxaparin (LOVENOX) injection  40 mg Subcutaneous Q24H  . feeding supplement  1 Container Oral Daily  . ferrous gluconate  324 mg Oral Q breakfast  . furosemide  80 mg Intravenous BID  . gabapentin  100 mg Oral BID  . losartan  12.5 mg Oral Daily  . naproxen  250 mg Oral BID WC  . pantoprazole  40 mg Oral Daily  . sodium chloride  3 mL Intravenous Q12H  . tiotropium  18 mcg Inhalation Daily  . vancomycin  1,250 mg Intravenous Q24H   Continuous Infusions:  Principal Problem:   Cardiac cirrhosis Active Problems:   Ischemic heart disease   Hypercholesterolemia   Iron deficiency anemia   Hx of bladder cancer   Congestive heart failure with left ventricular systolic dysfunction   Ischemic cardiomyopathy   HTN (hypertension)   COPD (chronic obstructive pulmonary disease)   Atrial fibrillation   Cellulitis of leg, right   Transaminitis   Hyperbilirubinemia   Hyponatremia   Acute renal insufficiency   Jaundice of recent onset   Ascites   Cirrhosis of liver - by CT and clinical findings   Acute exacerbation of CHF (congestive heart failure)    Time spent: > 35 mins    Dr [facility]  [facility] Pager [phone]. If 7PM-7AM, please contact night-coverage at [email or web address], password [facility] 02/18/2013, 2:31 PM  LOS: 3 days

## 2013-02-18 NOTE — Progress Notes (Signed)
PT Cancellation Note  Patient Details Name: GARRIS MELHORN MRN: [id number] DOB: [birth year]   Cancelled Treatment:    Reason Eval/Treat Not Completed: Fatigue/lethargy limiting ability to participate Pt reports not feeling well today and declines therapy however would like to PT to continue checking on him.     Kassidy Frankson,KATHrine E 02/18/2013, 3:10 PM [name], PT, DPT 02/18/2013 Pager: [phone]

## 2013-02-18 NOTE — Progress Notes (Signed)
   Subjective:  Denies CP; complains of dyspnea    Objective:  Filed Vitals:   02/17/13 0958 02/17/13 1440 02/17/13 2112 02/18/13 0448  BP:  104/55 111/52 105/51  Pulse:  81 83 97  Temp:  98.7 F (37.1 C) 97.9 F (36.6 C) 98.2 F (36.8 C)  TempSrc:  Oral Oral Oral  Resp:  20 20 20   Height:      Weight:    173 lb 4.5 oz (78.6 kg)  SpO2: 93% 95% 95% 96%    Intake/Output from previous day:  Intake/Output Summary (Last 24 hours) at 02/18/13 0719 Last data filed at 02/18/13 4098  Gross per 24 hour  Intake   1210 ml  Output   1800 ml  Net   -590 ml    Physical Exam: Physical exam: Well-developed frail in no acute distress.  Skin is warm and dry.  HEENT is normal.  Neck is supple.  Chest with diminished BS bases Cardiovascular exam is regular rate and rhythm.  Abdominal exam Hernia; fluid wave Extremities show cellulitis RLE; diffuse anasarca neuro grossly intact    Lab Results: Basic Metabolic Panel:  Recent Labs  11/91/47 1702  02/17/13 0525 02/18/13 0515  NA  --   < > 126* 127*  K  --   < > 5.1 4.2  CL  --   < > 96 95*  CO2  --   < > 27 26  GLUCOSE  --   < > 82 90  BUN  --   < > 19 23  CREATININE  --   < > 1.11 1.05  CALCIUM  --   < > 8.0* 8.1*  MG 2.3  --   --   --   < > = values in this interval not displayed. CBC:  Recent Labs  01/30/2013 1115  02/17/13 0525 02/18/13 0515  WBC 8.1  < > 9.7 9.2  NEUTROABS 6.7  --  7.7  --   HGB 10.9*  < > 9.7* 9.4*  HCT 30.7*  < > 27.1* 27.8*  MCV 67.6*  < > 68.4* 70.2*  PLT 156  < > 129* 130*  < > = values in this interval not displayed.   Assessment/Plan:  1 acute on chronic systolic congestive heart failure-the patient remains markedly volume overloaded. I will increase Lasix to 80 mg IV twice a day. Some of his edema is clearly related to low albumin/oncotic pressure. Therefore diuresis will be difficult. Continue ARB. I will hold on beta-blockade given acute CHF. If we want to be aggressive we could  add milrinone. However the patient appears to be end stage with cardiomyopathy and now cardiac cirrhosis. I will review with Dr. Patty Sermons. Palliative care consult may be appropriate. 2 cellulitis-continue antibiotics. Management per primary care. 3 cardiac cirrhosis-patient is followed by gastroenterology. 4 CAD- continue aspirin. Discontinue Plavix. 5 history of paroxysmal atrial fibrillation-the patient remains in sinus rhythm. He is not a Coumadin candidate.  Olga Millers 02/18/2013, 7:19 AM

## 2013-02-19 DIAGNOSIS — Z515 Encounter for palliative care: Secondary | ICD-10-CM

## 2013-02-19 DIAGNOSIS — I509 Heart failure, unspecified: Secondary | ICD-10-CM

## 2013-02-19 DIAGNOSIS — J449 Chronic obstructive pulmonary disease, unspecified: Secondary | ICD-10-CM

## 2013-02-19 DIAGNOSIS — R17 Unspecified jaundice: Secondary | ICD-10-CM

## 2013-02-19 LAB — BASIC METABOLIC PANEL
CO2: 27 mEq/L (ref 19–32)
Calcium: 8.1 mg/dL — ABNORMAL LOW (ref 8.4–10.5)
Chloride: 95 mEq/L — ABNORMAL LOW (ref 96–112)
Creatinine, Ser: 1.22 mg/dL (ref 0.50–1.35)
GFR calc Af Amer: 61 mL/min — ABNORMAL LOW (ref >90)
Sodium: 127 mEq/L — ABNORMAL LOW (ref 135–145)

## 2013-02-19 MED ORDER — SPIRONOLACTONE 25 MG PO TABS
25.0000 mg | ORAL_TABLET | Freq: Every day | ORAL | Status: DC
  Administered 2013-02-19: 25 mg via ORAL
  Filled 2013-02-19 (×2): qty 1

## 2013-02-19 MED ORDER — MILRINONE IN DEXTROSE 20 MG/100ML IV SOLN
0.1250 ug/kg/min | INTRAVENOUS | Status: DC
  Administered 2013-02-19: 0.125 ug/kg/min via INTRAVENOUS
  Filled 2013-02-19: qty 100

## 2013-02-19 NOTE — Progress Notes (Signed)
Patient MV:HQIO H Mcgilvray      DOB: 1925-10-11      NGE:[id number]   Palliative Medicine Team at [facility] Progress Note    Subjective:  Patient in good spirits this am.  States breathing slight harder compared with yesterday. Reviewed case with Dr. Jens Som and Dr. Waymon Amato.  Plan to maximize diuresis with brief milrinone drip.  Family would like hospice at discharge will place order to offer choice. Patient ate his breakfast. No new complaints.     Filed Vitals:   02/19/13 1424  BP: 98/33  Pulse:   Temp:   Resp:    Physical exam:  General: mildly more tachypnea noted at rest [place], AT, PERRL, [place], mmm Chest bilateral crackles,  No wheezing CVS:  Regular S1, S2,  3/6 Abdomen: slightly more distended this am, more from abdomen wall edema Ext.  Right toe less red but leg remains with cherry red petechial changes, slight scab on anterior shin , redness travels to the level of the thigh, peripheral edema 1 Neuro:awke, alert, oriented to time place and person   Lab Results  Component Value Date   CREATININE 1.22 02/19/2013   BUN 27* 02/19/2013   NA 127* 02/19/2013   K 4.5 02/19/2013   CL 95* 02/19/2013   CO2 27 02/19/2013     Assessment and plan:  79 yr old white male with known CHF, cardiomyopathy EF 35 % .  Patient admitted for right leg cellulitis after having his toenails clipped. Found to have transaminitis felt to be secondary to cardiac cirrhosis.  Plan for optimizing medical management of infection and CHF.  Patient accepting referral for Home with Hospice related to his cardiac condition.   1.  DNR/ DNI  2.  CHF for trial of short course of Milrinone to maximize diuresis before discharge home with hospice.   3.  Pain in leg : patient states his heart stopped when he took opiates/Narcotics before.  Currently pain in leg described as burning.  Patient on his Neuron tin.  Could trial cold back to right leg.  Addition of NSAID or tylenol contraindicated right now due to renal  and liver dysfunction.  Prognosis: likely six months or less related to cardiac function  Disposition: home with hospice once medical therapy optimized.  MOST form provided to family to look over to fill out.   Total time 25 min   Azaryah Heathcock L. Ladona Ridgel, MD MBA The Palliative Medicine Team at [facility] Phone: [phone] Pager: [phone]

## 2013-02-19 NOTE — Progress Notes (Signed)
PT Cancellation Note  Patient Details Name: KELDRICK POMPLUN MRN: [id number] DOB: [birth year]   Cancelled Treatment:    Reason Eval/Treat Not Completed: Medical issues which prohibited therapy RN reports pt on new IV med and defer therapy today.   Woody Kronberg,KATHrine E 02/19/2013, 2:13 PM [name], PT, DPT 02/19/2013 Pager: [phone]

## 2013-02-19 NOTE — Progress Notes (Signed)
I had the opportunity tonight to speak with patient by phone and later to speak with his wife at home by phone to discuss his situation. He appears to be gaining a little ground as the cellulitis is coming under control.

## 2013-02-19 NOTE — Progress Notes (Signed)
[facility] PROGRESS NOTE  Trevor [name] ZOX:[id number] DOB: 31-Dec-1924 DOA: 02/02/2013 PCP: Cassell Clement, MD  Interval history 79 year old male patient with history of CAD, remote MI, cardiogenic shock, chronic systolic CHF, HTN, HL, iron deficiency anemia was admitted on 02/04/2013 with worsening right lower extremity erythema and pain. He recently had intervention by his podiatrist on right first toenail. In the ED, Dopplers were negative for DVT. He was admitted for RLE cellulitis, abnormal LFTs and hyponatremia. [facility] have consulted. It is felt that patient probably has cardiac cirrhosis and paroxysmal A. fib. Palliative care consultation was suggested- called on 4/30.  Assessment/Plan: 1. Right lower extremity cellulitis: Likely secondary to recent trimming of toenails per podiatry with bleeding noted per patient. Clinically improving. Elevate lower extremity. Continue Neurontin. Pain management. Patient was treated with IV vancomycin (completed 4 days). Improving. Transitioned to oral doxycycline beginning 5/1. Monitor. 2. Transaminitis/hyperbilirubinemia/jaundice/cardiac cirrhosis: May be secondary to cardiac cirrhosis. Acute hepatitis panel is negative.  Abdominal ultrasound with biliary sludge but negative for acute cholecystitis and normal, bile duct. Patient asymptomatic. LFTs fluctuating. ANA is pending. INR is 1.41. Haptoglobin is at 36. Pro BNP is elevated at 3044. CT of the abdomen and pelvis with bilateral pleural effusions, cardiomegaly and moderate volume of ascites suggestive of volume overload and anasarca. Cholelithiasis. GI and cardiology consultation appreciated. GI signed off. 3. Acute on chronic systolic CHF secondary to ischemic cardiomyopathy: Patient with probable end-stage CHF leading to cardiac cirrhosis. Cardiology input appreciated. Lasix increased on 4/30 on 4/30. LVEF 35%. Adding Aldactone and Milrinone drip on 5/1. 4. Hyponatremia: Likely  secondary to hypervolemic hyponatremia. Urine sodium was less than 10. Sodium levels started to trend back down. Patient is still quite volume overloaded. Continue IV Lasix 5. Acute renal insufficiency: Likely secondary to prerenal azotemia secondary to CHF exacerbation. Resolved but creatinine creeping up. Monitor closely while on diuretics. 6. Hyperlipidemia: Statin on hold secondary to problem #2. 7. Coronary artery disease: Stable. Continue aspirin and Plavix. 8. PAF: 9. Anemia and thrombocytopenia: Stable. Anemia probably secondary to chronic disease. Thrombocytopenia? Secondary to cirrhosis. 10. Prophylaxis: PPI for GI prophylaxis Lovenox for DVT prophylaxis. 11. Prognosis: Patient with end-stage CHF and now with probable cardiac cirrhosis with a poor prognosis. Palliative care consultation appreciated. Patient wishes to continue current medical management of cellulitis and CHF. When he is medically improved and stable, plan to discharge home with hospice.  Code Status: DO NOT RESUSCITATE Family Communication: Discussed with patient spouse on 4/30 Disposition Plan: Home when medically stable.  Discussed with Dr. Patty Sermons on 5/1 and requested that he speak to the family.   Consultants:  Gastroenterology  Cardiology    Procedures:  Abdominal ultrasound 01/31/2013  2-D echo pending  Antibiotics:  IV vancomycin 02/04/2013- 4/30  PO Doxycycline 5/1>  HPI/Subjective: Indicates that right leg symptoms have significantly improved since admission and complains of some burning in right leg. Dyspnea better than yesterday.  Objective: Filed Vitals:   02/18/13 1505 02/18/13 1557 02/18/13 2155 02/19/13 0501  BP: 87/45 101/40 91/43 106/46  Pulse: 55 70 87 93  Temp: 98.6 F (37 C)  97.6 F (36.4 C) 97.9 F (36.6 C)  TempSrc: Oral  Oral Oral  Resp: 20  20 20   Height:      Weight:    77.1 kg (169 lb 15.6 oz)  SpO2: 99%  98% 99%    Intake/Output Summary (Last 24 hours) at  02/19/13 0717 Last data filed at 02/19/13 0600  Gross per 24  hour  Intake    610 ml  Output   2400 ml  Net  -1790 ml   Filed Weights   02/17/13 0500 02/18/13 0448 02/19/13 0501  Weight: 78.8 kg (173 lb 11.6 oz) 78.6 kg (173 lb 4.5 oz) 77.1 kg (169 lb 15.6 oz)    Exam:   General:  Sitting propped up in bed and appears much more comfortable than yesterday. Scleral icterus.  Cardiovascular: RRR with 3/6 SEM. JVD + and pedal edema 1+.   Respiratory: Reduced breath sounds in the bases with few fine basal crackles. Rest of lung fields clear to auscultation. No increased work of breathing. Still able to speak in full sentences.  Abdomen: Soft/NT/ND/ reducible abdominal hernia. Normal bowel sounds heard.  Extremities: 1+ bilateral lower extremity edema. Right lower extremity with decreased erythema (still has patchy erythema right lateral leg up to knee), no increased warmth, and nontender to palpation.  Data Reviewed: Basic Metabolic Panel:  Recent Labs Lab 02/09/2013 1115 01/24/2013 1702 02/16/13 0421 02/17/13 0525 02/18/13 0515 02/19/13 0510  NA 126*  --  128* 126* 127* 127*  K 5.0  --  4.5 5.1 4.2 4.5  CL 91*  --  97 96 95* 95*  CO2 29  --  29 27 26 27   GLUCOSE 85  --  103* 82 90 98  BUN 24*  --  22 19 23  27*  CREATININE 1.38*  --  1.10 1.11 1.05 1.22  CALCIUM 8.6  --  7.9* 8.0* 8.1* 8.1*  MG  --  2.3  --   --   --   --    Liver Function Tests:  Recent Labs Lab 02/06/2013 1115 02/16/13 0421 02/17/13 0525 02/18/13 0515  AST 141* 110* 107* 136*  ALT 77* 56* 54* 57*  ALKPHOS 148* 127* 142* 130*  BILITOT 7.5* 5.4* 6.1* 6.6*  PROT 8.1 6.3 6.6 6.7  ALBUMIN 1.7* 1.3* 1.4* 1.2*    Recent Labs Lab 02/06/2013 1115  LIPASE 60*   No results found for this basename: AMMONIA,  in the last 168 hours CBC:  Recent Labs Lab 01/20/2013 1115 02/16/13 0421 02/17/13 0525 02/18/13 0515  WBC 8.1 8.1 9.7 9.2  NEUTROABS 6.7  --  7.7  --   HGB 10.9* 9.4* 9.7* 9.4*  HCT 30.7*  26.3* 27.1* 27.8*  MCV 67.6* 67.6* 68.4* 70.2*  PLT 156 144* 129* 130*   Cardiac Enzymes: No results found for this basename: CKTOTAL, CKMB, CKMBINDEX, TROPONINI,  in the last 168 hours BNP (last 3 results)  Recent Labs  09/12/12 1053 02/17/13 0525 02/18/13 0515  PROBNP 391.0* 3044.0* 4538.0*   CBG: No results found for this basename: GLUCAP,  in the last 168 hours  Recent Results (from the past 240 hour(s))  URINE CULTURE     Status: None   Collection Time    01/30/2013  4:55 PM      Result Value Range Status   Specimen Description URINE, CATHETERIZED   Final   Special Requests NONE   Final   Culture  Setup Time 02/10/2013 21:38   Final   Colony Count NO GROWTH   Final   Culture NO GROWTH   Final   Report Status 02/16/2013 FINAL   Final     Studies: No results found. 2-D echo Study Conclusions  - Left ventricle: Technically limited study. Severe hypokinesis of the inferior wall. The cavity size was mildly to moderately dilated. The estimated ejection fraction was 35%. Findings consistent with left  ventricular diastolic dysfunction. - Aortic valve: Sclerosis without stenosis. - Left atrium: The atrium was moderately to severely dilated. - Right ventricle: The cavity size was mildly dilated. Systolic function was normal.   Scheduled Meds: . aspirin  81 mg Oral QODAY  . docusate sodium  100 mg Oral Daily  . doxycycline  100 mg Oral Q12H  . enoxaparin (LOVENOX) injection  40 mg Subcutaneous Q24H  . feeding supplement  1 Container Oral Daily  . ferrous gluconate  324 mg Oral Q breakfast  . furosemide  80 mg Intravenous BID  . gabapentin  100 mg Oral BID  . losartan  12.5 mg Oral Daily  . pantoprazole  40 mg Oral Daily  . sodium chloride  3 mL Intravenous Q12H  . tiotropium  18 mcg Inhalation Daily   Continuous Infusions:    Principal Problem:   Cardiac cirrhosis Active Problems:   Ischemic heart disease   Hypercholesterolemia   Iron deficiency anemia   Hx of  bladder cancer   Congestive heart failure with left ventricular systolic dysfunction   Ischemic cardiomyopathy   HTN (hypertension)   COPD (chronic obstructive pulmonary disease)   Atrial fibrillation   Cellulitis of leg, right   Transaminitis   Hyperbilirubinemia   Hyponatremia   Acute renal insufficiency   Jaundice of recent onset   Ascites   Cirrhosis of liver - by CT and clinical findings   Acute exacerbation of CHF (congestive heart failure)   Systolic CHF, acute on chronic    Time spent: > 35 mins    [facility]  [facility] Pager [phone]. If 7PM-7AM, please contact night-coverage at [email or web address], password [facility] 02/19/2013, 7:17 AM  LOS: 4 days

## 2013-02-19 NOTE — Progress Notes (Signed)
OT Cancellation Note  Patient Details Name: Trevor [name] MRN: [id number] DOB: 04-24-1925   Cancelled Treatment:    Reason Eval/Treat Not Completed: Medical issues which prohibited therapy. BP being taken every 15 minutes with new med  Alba Cory 02/19/2013, 10:49 AM

## 2013-02-19 NOTE — Progress Notes (Signed)
Talked to patient's spouse and daughter about home hospice choices, spouse [name]) [facility] ; Valente David RN with [facility] called for arrangements; B Layia Walla RN,BSN,MHA.

## 2013-02-19 NOTE — Progress Notes (Addendum)
   Subjective:  Denies CP; dyspnea persists but improving    Objective:  Filed Vitals:   02/18/13 1505 02/18/13 1557 02/18/13 2155 02/19/13 0501  BP: 87/45 101/40 91/43 106/46  Pulse: 55 70 87 93  Temp: 98.6 F (37 C)  97.6 F (36.4 C) 97.9 F (36.6 C)  TempSrc: Oral  Oral Oral  Resp: 20  20 20   Height:      Weight:    169 lb 15.6 oz (77.1 kg)  SpO2: 99%  98% 99%    Intake/Output from previous day:  Intake/Output Summary (Last 24 hours) at 02/19/13 0734 Last data filed at 02/19/13 0600  Gross per 24 hour  Intake    610 ml  Output   2400 ml  Net  -1790 ml    Physical Exam: Physical exam: Well-developed frail in no acute distress.  Skin is warm and dry.  HEENT is normal.  Neck is supple.  Chest with mildly diminished BS bases; mild crackles Cardiovascular exam is regular rate and rhythm.  Abdominal exam-hernia; fluid wave Extremities show cellulitis RLE; diffuse anasarca neuro grossly intact    Lab Results: Basic Metabolic Panel:  Recent Labs  16/10/96 0515 02/19/13 0510  NA 127* 127*  K 4.2 4.5  CL 95* 95*  CO2 26 27  GLUCOSE 90 98  BUN 23 27*  CREATININE 1.05 1.22  CALCIUM 8.1* 8.1*   CBC:  Recent Labs  02/17/13 0525 02/18/13 0515  WBC 9.7 9.2  NEUTROABS 7.7  --   HGB 9.7* 9.4*  HCT 27.1* 27.8*  MCV 68.4* 70.2*  PLT 129* 130*     Assessment/Plan:  1 acute on chronic systolic congestive heart failure-the patient remains markedly volume overloaded but improved this AM. Continue Lasix 80 mg IV twice a day. Add spironolactone 25 mg po daily; Some of his edema is clearly related to low albumin/oncotic pressure. Therefore diuresis will be difficult. Continue ARB. I will hold on beta-blockade given acute CHF (add later as CHF improves). If we want to be aggressive we could add milrinone. However the patient appears to be end stage with cardiomyopathy and now cardiac cirrhosis. Will await recommendations from palliative care. 2  cellulitis-continue antibiotics. Management per primary care. 3 cardiac cirrhosis-patient is followed by gastroenterology. 4 CAD- continue aspirin. Discontinue Plavix. 5 history of paroxysmal atrial fibrillation-the patient remains in sinus rhythm. He is not a Coumadin candidate.  Olga Millers 02/19/2013, 7:34 AM   Addendum - spoke with Dr Waymon Amato; family would like all medical therapy at this point (pt remains NCB); plan begin course of milrinone. Olga Millers

## 2013-02-19 NOTE — Progress Notes (Signed)
MEDICATION RELATED CONSULT NOTE - INITIAL   Pharmacy Consult for Milrinone Indication: acute inotropic support  Allergies  Allergen Reactions  . Fentanyl And Related Anaphylaxis  . Morphine And Related Anaphylaxis    Cardiac arrest  . Other Other (See Comments)    ALL SEDATIVES;  Lethargic, can hardly function, etc.  . Lisinopril Cough  . Vioxx (Rofecoxib) Other (See Comments)    Lethargic, can hardly function, very hard to wake up    Patient Measurements: Height: 5' (152.4 cm) Weight: 169 lb 15.6 oz (77.1 kg) IBW/kg (Calculated) : 50  Vital Signs: Temp: 97.9 F (36.6 C) (05/01 0501) Temp src: Oral (05/01 0501) BP: 106/46 mmHg (05/01 0501) Pulse Rate: 93 (05/01 0501) Intake/Output from previous day: 04/30 0701 - 05/01 0700 In: 610 [P.O.:360; IV Piggyback:250] Out: 2400 [Urine:2400] Intake/Output from this shift:    Labs:  Recent Labs  02/17/13 0525 02/18/13 0515 02/19/13 0510  WBC 9.7 9.2  --   HGB 9.7* 9.4*  --   HCT 27.1* 27.8*  --   PLT 129* 130*  --   CREATININE 1.11 1.05 1.22  ALBUMIN 1.4* 1.2*  --   PROT 6.6 6.7  --   AST 107* 136*  --   ALT 54* 57*  --   ALKPHOS 142* 130*  --   BILITOT 6.1* 6.6*  --   BILIDIR 4.3*  --   --   IBILI 1.8*  --   --    Estimated Creatinine Clearance: 38.1 ml/min (by C-G formula based on Cr of 1.22).   Assessment: 87 yom presented 4/27 with RLE cellulitis. Cardiology on board given acute on chronic CHF 2/2 ischemic cardiomyopathy, cardiac cirrhosis, biventricular failure and paroxysmal afib. Family would like aggressive care, MD ordered for a course of milrinone per pharmacy dosing for acute inotropic support.   Patient's Scr = 1.22 for CrCl of 38 ml/min. SBP in the low 100s and DBP in the 40s.    Monitoring parameters: BP, HR, UOP, arrhythmias, CBC, electrolytes, liver/renal function  Plan:   No loading dose, initiate milrinone at 0.125 mcg/kg/min  Per conversation with Dr. Jens Som, no titration needed at this  time.  Pharmacy will f/u  Geoffry Paradise, PharmD, BCPS Pager: [phone] 8:25 AM Pharmacy #: [phone]

## 2013-02-19 NOTE — Consult Note (Signed)
Patient Trevor [name]      DOB: 02-14-1925      EAV:[id number]     Consult Note from the Palliative Medicine Team at [facility]    Consult Requested by: Trevor [name]     PCP: Trevor Clement, MD Reason for Consultation: Goals of care related symptom    Phone Number:[phone]  Assessment of patients Current state: 79 yr old white male admitted with cellulitis of the right leg after having toenails cut.  Patient found to have transaminitis which is felt to be secondary to cardiac cirrhosis.  Patient end family were able to understand from their conversation with Trevor [name] that his heart is in end stage disease. He has expressed to his family that if that is the case, the next time his heart condition compromises him he would not want to come back to the hospital, he would want to focus on comfort.  His family was open to discussing Hospice Philosphy of care and are considering discharging to home with hospice care.  They feel that if he has a small issue that needed attention like his current toe infection ,that they would want these reversible issues treated.  We reviewed a MOST form and they were going to talk among themselves about how to complete these categories.  I also gave them an advanced directive packet to review.  Currently , They are ok with talking with the  Hospice of their  choice prior to discharge.   Goals of Care: 1.  Code Status: DNR/ DNI   2. Scope of Treatment:  Continue current medical management of patients cellulitis and medical management of his cardiac disease with the intent of optimizing both processes for discharge home with hospice care.   4. Disposition: Patient desires to return home with hospice care for his diagnosis of endstage cardiac disease.   3. Symptom Management:   1. Dyspnea: maximize therapy per cardiology.  In the future,  Roxanol can be used when the symptom burden outweighs quality of life , he lists an allergy to morphine which will  need to be elicited from family. For now medical management of heart failure is appropriate. 2. Cellulitis : continue standard of treatment for infection 3. Tranaminitis:  Maximize medical therapy and limit hepatotoxic drugs. Pain at this time appears to be intermittent to the leg.  No medication on board for this.  He lists multiple opiate allergies. Will need to discuss with his wife what these allergies are related to may be more intolerances. 4. Psychosocial:  Married to his wife Trevor [name] and has a Customer service manager and son in law who are  Active in his care.  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  5. Spiritual: we did not speak directly abou his current spiritual support but will review.        Patient Documents Completed or Given: Document Given Completed  Advanced Directives Pkt    MOST    DNR    Gone from My Sight    Hard Choices      Brief HPI: 79 yr old admitted with redness of right foot and leg after having toenails cut.  Patient admitted for cellulitis but found to have right upper quadrant tenderness and transaminitis felt to be secondary to cardiac cirrhosis. Asked to assist with GOC   ROS:  Mildly dyspneic,  No chest pain , no pain in leg    PMH:  Past Medical History  Diagnosis Date  .  CAD (coronary artery disease)     Remote inferior MI in 1984, complicated by VFIB requiring multiple shocks, cardiogenic shock and CHF  . CHF (congestive heart failure)     Last echo in 2005 with EF 50 to 55%  . Arrhythmia     Hx of VFIB in 1984  . Abnormal nuclear cardiac imaging test     Lexiscan in Jan 2011 with EF 44% and large area of infarct involving the inferoseptal basal, inferior and inferolateral wall.   . Iron deficiency anemia   . HTN (hypertension)   . Bladder cancer   . Hip fracture   . Chronic back pain   . Right bundle branch block     Chronic right bundle-branch block.   . History of tobacco abuse   . Cellulitis       Chronic cellulitis right ankle.  . Anemia   . CAD (coronary artery disease)     With remote large inferior MI dating back to 1984 with associated VFib arrest. Was not cathed.   . Dyslipidemia   . Nonhealing nonsurgical wound      Infected, chronic nonhealing wound, right ankle  . Postoperative ileus   . Hypercholesterolemia   . Hyponatremia   . Inguinal hernia     Strangulated recurrent right inguinal hernia  . Depression   . Osteoarthritis     severe  . Chronic constipation   . Palpitations   . Systolic heart failure     EF is 25% per echo in October 2012 with global hypokinesis and posterior and inferior akinesis, as well as grade 2 diastolic dysfunction, moderate TR and mild pulmonary HTN  . NSTEMI (non-ST elevated myocardial infarction) 05/07/2012  . Cirrhosis of liver - by CT and clinical findings 02/17/2013     PSH: Past Surgical History  Procedure Laterality Date  . Hernia repair    . Hip surgery    . Ankle surgery      past right ankle fracture   I have reviewed the FH and SH and  If appropriate update it with new information. Allergies  Allergen Reactions  . Fentanyl And Related Anaphylaxis  . Morphine And Related Anaphylaxis    Cardiac arrest  . Other Other (See Comments)    ALL SEDATIVES;  Lethargic, can hardly function, etc.  . Lisinopril Cough  . Vioxx (Rofecoxib) Other (See Comments)    Lethargic, can hardly function, very hard to wake up   Scheduled Meds: . aspirin  81 mg Oral QODAY  . docusate sodium  100 mg Oral Daily  . doxycycline  100 mg Oral Q12H  . enoxaparin (LOVENOX) injection  40 mg Subcutaneous Q24H  . feeding supplement  1 Container Oral Daily  . ferrous gluconate  324 mg Oral Q breakfast  . furosemide  80 mg Intravenous BID  . gabapentin  100 mg Oral BID  . losartan  12.5 mg Oral Daily  . pantoprazole  40 mg Oral Daily  . sodium chloride  3 mL Intravenous Q12H  . tiotropium  18 mcg Inhalation Daily   Continuous Infusions:  PRN  Meds:.albuterol, alum & mag hydroxide-simeth, nitroGLYCERIN, ondansetron (ZOFRAN) IV, ondansetron, polyethylene glycol    BP 106/46  Pulse 93  Temp(Src) 97.9 F (36.6 C) (Oral)  Resp 20  Ht 5' (1.524 m)  Wt 77.1 kg (169 lb 15.6 oz)  BMI 33.2 kg/m2  SpO2 99%   PPS:40%   Intake/Output Summary (Last 24 hours) at 02/19/13 4098 Last data filed at  02/19/13 0600  Gross per 24 hour  Intake    610 ml  Output   2400 ml  Net  -1790 ml    Physical Exam:  Limited exam:  Right great toe mildly inflamed , toe nail deformed Chest decreased with some upper airway rhonchi, clears with cough CVS:  RRR 3/6 SEM Ext: trace to 1 edema Neuro: awake , alert oriented x 3    Labs: CBC    Component Value Date/Time   WBC 9.2 02/18/2013 0515   RBC 3.96* 02/18/2013 0515   HGB 9.4* 02/18/2013 0515   HCT 27.8* 02/18/2013 0515   PLT 130* 02/18/2013 0515   MCV 70.2* 02/18/2013 0515   MCH 23.7* 02/18/2013 0515   MCHC 33.8 02/18/2013 0515   RDW 21.2* 02/18/2013 0515   LYMPHSABS 1.0 02/17/2013 0525   MONOABS 0.9 02/17/2013 0525   EOSABS 0.1 02/17/2013 0525   BASOSABS 0.0 02/17/2013 0525      CMP     Component Value Date/Time   NA 127* 02/18/2013 0510   K 4.2 02/18/2013 0510   CL 95* 02/18/2013 0510   CO2 26 02/18/2013 0510   GLUCOSE 90 02/18/2013 0510   BUN 23* 02/18/2013 0510   CREATININE 1.05 4/30-/2014 0510   CALCIUM 8.1* 02/18/2013 0510   PROT 6.7 02/18/2013 0515   ALBUMIN 1.2* 02/18/2013 0515   AST 136* 02/18/2013 0515   ALT 57* 02/18/2013 0515   ALKPHOS 130* 02/18/2013 0515   BILITOT 6.6* 02/18/2013 0515    CT scan of the abdomen and pelvis Reviewed/Impressions: bilateral pleural effusions, hepatic cirrhosis, osteopenia, cholelithiasis    Time In Time Out Total Time Spent with Patient Total Overall Time  500 pm 650 pm 100 min 110 min    Greater than 50%  of this time was spent counseling and coordinating care related to the above assessment and plan.   Chaundra Abreu L. Ladona Ridgel, MD MBA The  Palliative Medicine Team at [facility] Phone: [phone] Pager: [phone]

## 2013-02-19 DEATH — deceased

## 2013-02-20 ENCOUNTER — Telehealth: Payer: Self-pay | Admitting: Cardiology

## 2013-02-20 DIAGNOSIS — J9600 Acute respiratory failure, unspecified whether with hypoxia or hypercapnia: Secondary | ICD-10-CM

## 2013-02-20 DIAGNOSIS — E875 Hyperkalemia: Secondary | ICD-10-CM | POA: Diagnosis not present

## 2013-02-20 DIAGNOSIS — R579 Shock, unspecified: Secondary | ICD-10-CM | POA: Diagnosis not present

## 2013-02-20 LAB — BASIC METABOLIC PANEL
BUN: 35 mg/dL — ABNORMAL HIGH (ref 6–23)
BUN: 37 mg/dL — ABNORMAL HIGH (ref 6–23)
CO2: 26 mEq/L (ref 19–32)
CO2: 24 mEq/L (ref 19–32)
Calcium: 7.9 mg/dL — ABNORMAL LOW (ref 8.4–10.5)
Calcium: 8.6 mg/dL (ref 8.4–10.5)
Chloride: 93 mEq/L — ABNORMAL LOW (ref 96–112)
Creatinine, Ser: 1.76 mg/dL — ABNORMAL HIGH (ref 0.50–1.35)
Creatinine, Ser: 1.84 mg/dL — ABNORMAL HIGH (ref 0.50–1.35)
Creatinine, Ser: 1.97 mg/dL — ABNORMAL HIGH (ref 0.50–1.35)
GFR calc non Af Amer: 29 mL/min — ABNORMAL LOW (ref >90)
Glucose, Bld: 90 mg/dL (ref 70–99)
Glucose, Bld: 106 mg/dL — ABNORMAL HIGH (ref 70–99)

## 2013-02-20 LAB — MRSA PCR SCREENING: MRSA by PCR: NEGATIVE

## 2013-02-20 MED ORDER — VANCOMYCIN HCL IN DEXTROSE 1-5 GM/200ML-% IV SOLN
1000.0000 mg | INTRAVENOUS | Status: DC
  Administered 2013-02-20: 1000 mg via INTRAVENOUS
  Filled 2013-02-20: qty 200

## 2013-02-20 MED ORDER — DOPAMINE-DEXTROSE 3.2-5 MG/ML-% IV SOLN
2.0000 ug/kg/min | INTRAVENOUS | Status: DC
  Administered 2013-02-20: 16 ug/kg/min via INTRAVENOUS
  Filled 2013-02-20 (×2): qty 250

## 2013-02-20 MED ORDER — FUROSEMIDE 10 MG/ML IJ SOLN
40.0000 mg | Freq: Two times a day (BID) | INTRAMUSCULAR | Status: DC
  Administered 2013-02-20: 40 mg via INTRAVENOUS
  Filled 2013-02-20: qty 4

## 2013-02-20 MED ORDER — INSULIN ASPART 100 UNIT/ML IV SOLN
10.0000 [IU] | Freq: Once | INTRAVENOUS | Status: AC
  Administered 2013-02-20: 10 [IU] via INTRAVENOUS

## 2013-02-20 MED ORDER — ENOXAPARIN SODIUM 30 MG/0.3ML ~~LOC~~ SOLN
30.0000 mg | SUBCUTANEOUS | Status: DC
  Administered 2013-02-20: 30 mg via SUBCUTANEOUS
  Filled 2013-02-20 (×2): qty 0.3

## 2013-02-20 MED ORDER — ATROPINE SULFATE 1 % OP SOLN
4.0000 [drp] | OPHTHALMIC | Status: DC | PRN
  Filled 2013-02-20: qty 2

## 2013-02-20 MED ORDER — DEXTROSE 50 % IV SOLN
1.0000 | Freq: Once | INTRAVENOUS | Status: AC
  Administered 2013-02-20: 50 mL via INTRAVENOUS
  Filled 2013-02-20: qty 50

## 2013-02-20 MED ORDER — SODIUM CHLORIDE 0.9 % IV BOLUS (SEPSIS)
250.0000 mL | Freq: Once | INTRAVENOUS | Status: AC
  Administered 2013-02-20: 250 mL via INTRAVENOUS

## 2013-02-20 MED ORDER — DEXTROSE 5 % IV SOLN
1.0000 g | INTRAVENOUS | Status: DC
  Administered 2013-02-20: 1 g via INTRAVENOUS
  Filled 2013-02-20: qty 1

## 2013-02-20 MED ORDER — ALBUMIN HUMAN 25 % IV SOLN
25.0000 g | Freq: Once | INTRAVENOUS | Status: AC
  Administered 2013-02-20: 25 g via INTRAVENOUS
  Filled 2013-02-20: qty 100

## 2013-02-20 MED ORDER — LORAZEPAM 2 MG/ML IJ SOLN
0.5000 mg | INTRAMUSCULAR | Status: DC | PRN
  Administered 2013-02-20 – 2013-02-21 (×2): 0.5 mg via INTRAVENOUS
  Filled 2013-02-20 (×2): qty 1

## 2013-02-20 MED ORDER — DOPAMINE-DEXTROSE 3.2-5 MG/ML-% IV SOLN
3.0000 ug/kg/min | INTRAVENOUS | Status: DC
  Administered 2013-02-20: 3 ug/kg/min via INTRAVENOUS
  Filled 2013-02-20: qty 250

## 2013-02-20 MED ORDER — SODIUM POLYSTYRENE SULFONATE 15 GM/60ML PO SUSP
30.0000 g | Freq: Once | ORAL | Status: AC
  Administered 2013-02-20: 30 g via ORAL
  Filled 2013-02-20: qty 120

## 2013-02-20 MED ORDER — ALBUTEROL SULFATE (5 MG/ML) 0.5% IN NEBU: 2.5000 mg | INHALATION_SOLUTION | RESPIRATORY_TRACT | Status: DC | PRN

## 2013-02-20 MED ORDER — ALBUTEROL SULFATE (5 MG/ML) 0.5% IN NEBU
10.0000 mg | INHALATION_SOLUTION | Freq: Once | RESPIRATORY_TRACT | Status: AC
Start: 2013-02-20 — End: 2013-02-20
  Administered 2013-02-20: 10 mg via RESPIRATORY_TRACT
  Filled 2013-02-20: qty 0.5

## 2013-02-20 NOTE — Progress Notes (Signed)
Notified Dr. Ladona Ridgel that patient is experiencing mild Shob when awake.  O2 sats difficult to obtain.  80-90's on 100% non- rebreather.  Patient is confused and anxious.  Family and staff providing emotional support and reassurance.

## 2013-02-20 NOTE — Progress Notes (Signed)
[facility] PROGRESS NOTE  Trevor [name] MWU:[id number] DOB: 06/26/1925 DOA: 01/22/2013 PCP: Cassell Clement, MD  Interval history 79 year old male patient with history of CAD, remote MI, cardiogenic shock, chronic systolic CHF, HTN, HL, iron deficiency anemia was admitted on 02/14/2013 with worsening right lower extremity erythema and pain. He recently had intervention by his podiatrist on right first toenail. In the ED, Dopplers were negative for DVT. He was admitted for RLE cellulitis, abnormal LFTs and hyponatremia. [facility] and GI have consulted. It is felt that patient probably has cardiac cirrhosis and paroxysmal A. fib. Palliative care consultation was suggested- called on 4/30.  Assessment/Plan: 1. Shock: ? Cardiogenic versus intravascular volume depletion from over diuresis in severly hypoalbuminemic patient. Another possibility sepsis although seems less likely. Transfer to ICU. Discussed with family- they would like pressors and central line for it if needed but no mechanical ventilation, CPR or defibrillation. Titrate Dopamine drip. Hold Lasix. Iv Albumin to help with oncotic pressure. Avoid IVFs due to risk of acute pulmonary edema and no intubation. Start IV vancomycin and cefepime. Poor overall prognosis. Discussed with family, Dr. Jens Som and Dr. Ladona Ridgel. 2. Acute Renal Failure: due to # 1. Hold all culprit medications (ARB, Lasix and Aldactone)- Mx as above. Foley. F/u BMP. 3. Hyperkalemia: secondary to ARF. S/P Albuterol nebs, Kayexelate, insulin & dextrose. No EKG changes. Potassium down from 6.8-5.7. Repeat BMP later this evening. 4. Right lower extremity cellulitis: Likely secondary to recent trimming of toenails per podiatry with bleeding noted per patient. Clinically improving. Elevate lower extremity. Continue Neurontin. Pain management-unable to use any opioids secondary to prior? Cardiac arrest. Patient was treated with IV vancomycin (completed 4 days).  Improving. Transitioned to oral doxycycline beginning 5/1. Will broaden antibiotics to IV vancomycin and cefepime as indicated above. 5. Transaminitis/hyperbilirubinemia/jaundice/cardiac cirrhosis: May be secondary to cardiac cirrhosis. Acute hepatitis panel is negative.  Abdominal ultrasound with biliary sludge but negative for acute cholecystitis and normal, bile duct. Patient asymptomatic. LFTs fluctuating. ANA is pending. INR is 1.41. Haptoglobin is at 36. Pro BNP is elevated at 3044. CT of the abdomen and pelvis with bilateral pleural effusions, cardiomegaly and moderate volume of ascites suggestive of volume overload and anasarca. Cholelithiasis. GI and cardiology consultation appreciated. GI signed off. 6. Acute on chronic systolic CHF secondary to ischemic cardiomyopathy: Patient with probable end-stage CHF leading to cardiac cirrhosis. Cardiology input appreciated. Lasix increased on 4/30 on 4/30. LVEF 35%. Adding Aldactone and Milrinone drip on 5/1-patient declined overnight 5/1-became hypotensive and in acute renal failure. Milrinone, diuretics and ARB discontinued. Started on dopamine drip. Monitor 7. Hyponatremia: Likely secondary to hypervolemic hyponatremia. Urine sodium was less than 10. Sodium levels started to trend back down. Patient is still quite volume overloaded. Worsened secondary to renal failure. Follow BMP. 8. Hyperlipidemia: Statin on hold secondary to problem #2. 9. Coronary artery disease: Stable. Continue aspirin and Plavix. 10. PAF: 11. Anemia and thrombocytopenia: Stable. Anemia probably secondary to chronic disease. Thrombocytopenia? Secondary to cirrhosis. 12. Prophylaxis: PPI for GI prophylaxis Lovenox for DVT prophylaxis. 13. Prognosis: Extremely poor prognosis.  Code Status: Limited code-no CPR, defibrillation or intubation. Patient would like antiarrhythmics and pressors. Family Communication: Discussed with patient spouse and daughter at bedside. Disposition Plan:  Transfer to ICU. May not make it alive out of the hospital.   Consultants:  Gastroenterology  Cardiology  Palliative care team  Procedures:  Abdominal ultrasound 02/01/2013  2-D echo pending  Antibiotics:  IV vancomycin 02/16/2013- 4/30  PO Doxycycline 5/1> 5/2  IV  vancomycin 5/2 >   IV cefepime 5/2 >  HPI/Subjective: Patient says that he feels poorly. Complains of some abdominal discomfort and dyspnea. Overnight events noted-hypotensive, acute renal failure and hyperkalemia.  Objective: Filed Vitals:   02/20/13 1215 02/20/13 1230 02/20/13 1245 02/20/13 1256  BP: 97/31 85/32 93/26    Pulse: 101  70 96  Temp:      TempSrc:      Resp: 24 26 24 26   Height:      Weight:      SpO2: 91%  75% 85%    Intake/Output Summary (Last 24 hours) at 02/20/13 1430 Last data filed at 02/20/13 1230  Gross per 24 hour  Intake    290 ml  Output    450 ml  Net   -160 ml   Filed Weights   02/18/13 0448 02/19/13 0501 02/20/13 0557  Weight: 78.6 kg (173 lb 4.5 oz) 77.1 kg (169 lb 15.6 oz) 82.1 kg (181 lb)    Exam:   General:   patient looks much worse than last couple of days. Somnolent but arousable and answers some questions appropriately. Scleral icterus.  Cardiovascular: RRR with 3/6 SEM. JVD + and pedal edema 1+.   Respiratory: Reduced breath sounds in the bases with few fine basal crackles. Rest of lung fields clear to auscultation. Mild increased work of breathing. Still able to speak in full sentences.  Abdomen: Soft/NT/ND/ reducible abdominal hernia. Normal bowel sounds heard.  Extremities: 1+ bilateral lower extremity edema. Right lower extremity with decreased erythema (still has patchy erythema right lateral leg up to knee), no increased warmth, and nontender to palpation.  Data Reviewed: Basic Metabolic Panel:  Recent Labs Lab 02/27/2013 1702  02/18/13 0515 02/19/13 0510 02/20/13 0515 02/20/13 0640 02/20/13 1225  NA  --   < > 127* 127* 123* 123* 124*  K   --   < > 4.2 4.5 6.8* 6.8* 5.7*  CL  --   < > 95* 95* 93* 95* 92*  CO2  --   < > 26 27 26 24 22   GLUCOSE  --   < > 90 98 98 90 106*  BUN  --   < > 23 27* 35* 35* 37*  CREATININE  --   < > 1.05 1.22 1.76* 1.84* 1.97*  CALCIUM  --   < > 8.1* 8.1* 7.8* 7.9* 8.6  MG 2.3  --   --   --   --   --   --   < > = values in this interval not displayed. Liver Function Tests:  Recent Labs Lab 02-27-2013 1115 02/16/13 0421 02/17/13 0525 02/18/13 0515  AST 141* 110* 107* 136*  ALT 77* 56* 54* 57*  ALKPHOS 148* 127* 142* 130*  BILITOT 7.5* 5.4* 6.1* 6.6*  PROT 8.1 6.3 6.6 6.7  ALBUMIN 1.7* 1.3* 1.4* 1.2*    Recent Labs Lab Feb 27, 2013 1115  LIPASE 60*   No results found for this basename: AMMONIA,  in the last 168 hours CBC:  Recent Labs Lab 02/27/2013 1115 02/16/13 0421 02/17/13 0525 02/18/13 0515  WBC 8.1 8.1 9.7 9.2  NEUTROABS 6.7  --  7.7  --   HGB 10.9* 9.4* 9.7* 9.4*  HCT 30.7* 26.3* 27.1* 27.8*  MCV 67.6* 67.6* 68.4* 70.2*  PLT 156 144* 129* 130*   Cardiac Enzymes: No results found for this basename: CKTOTAL, CKMB, CKMBINDEX, TROPONINI,  in the last 168 hours BNP (last 3 results)  Recent Labs  09/12/12 1053 02/17/13 0525  02/18/13 0515  PROBNP 391.0* 3044.0* 4538.0*   CBG: No results found for this basename: GLUCAP,  in the last 168 hours  Recent Results (from the past 240 hour(s))  URINE CULTURE     Status: None   Collection Time    2013-03-11  4:55 PM      Result Value Range Status   Specimen Description URINE, CATHETERIZED   Final   Special Requests NONE   Final   Culture  Setup Time 03-11-13 21:38   Final   Colony Count NO GROWTH   Final   Culture NO GROWTH   Final   Report Status 02/16/2013 FINAL   Final  MRSA PCR SCREENING     Status: None   Collection Time    02/20/13 10:55 AM      Result Value Range Status   MRSA by PCR NEGATIVE  NEGATIVE Final   Comment:            The GeneXpert MRSA Assay (FDA     approved for NASAL specimens     only), is one  component of a     comprehensive MRSA colonization     surveillance program. It is not     intended to diagnose MRSA     infection nor to guide or     monitor treatment for     MRSA infections.     Studies: No results found. 2-D echo Study Conclusions  - Left ventricle: Technically limited study. Severe hypokinesis of the inferior wall. The cavity size was mildly to moderately dilated. The estimated ejection fraction was 35%. Findings consistent with left ventricular diastolic dysfunction. - Aortic valve: Sclerosis without stenosis. - Left atrium: The atrium was moderately to severely dilated. - Right ventricle: The cavity size was mildly dilated. Systolic function was normal.   Scheduled Meds: . aspirin  81 mg Oral QODAY  . ceFEPime (MAXIPIME) IV  1 g Intravenous Q24H  . enoxaparin (LOVENOX) injection  30 mg Subcutaneous Q24H  . feeding supplement  1 Container Oral Daily  . ferrous gluconate  324 mg Oral Q breakfast  . gabapentin  100 mg Oral BID  . pantoprazole  40 mg Oral Daily  . sodium chloride  3 mL Intravenous Q12H  . tiotropium  18 mcg Inhalation Daily  . vancomycin  1,000 mg Intravenous Q24H   Continuous Infusions: . DOPamine 15 mcg/kg/min (02/20/13 1145)    Principal Problem:   Shock- ? Cardiogenic/hypovolemic Active Problems:   Ischemic heart disease   Hypercholesterolemia   Iron deficiency anemia   Hx of bladder cancer   Congestive heart failure with left ventricular systolic dysfunction   Ischemic cardiomyopathy   HTN (hypertension)   COPD (chronic obstructive pulmonary disease)   Atrial fibrillation   Cellulitis of leg, right   Transaminitis   Hyperbilirubinemia   Hyponatremia   Acute renal insufficiency   Jaundice of recent onset   Ascites   Cirrhosis of liver - by CT and clinical findings   Acute exacerbation of CHF (congestive heart failure)   Cardiac cirrhosis   Systolic CHF, acute on chronic   Palliative care patient    Hyperkalemia    Time spent: > 35 mins    [facility]  [facility] Pager [phone]. If 7PM-7AM, please contact night-coverage at [email or web address], password [facility] 02/20/2013, 2:30 PM  LOS: 5 days

## 2013-02-20 NOTE — Progress Notes (Signed)
ANTIBIOTIC CONSULT NOTE - INITIAL  Pharmacy Consult for Vancomycin, Cefepime Indication: cellulitis   Allergies  Allergen Reactions  . Fentanyl And Related Anaphylaxis  . Morphine And Related Anaphylaxis    Cardiac arrest  . Other Other (See Comments)    ALL SEDATIVES;  Lethargic, can hardly function, etc.  . Lisinopril Cough  . Vioxx (Rofecoxib) Other (See Comments)    Lethargic, can hardly function, very hard to wake up    Patient Measurements: Height: 5' (152.4 cm) Weight: 181 lb (82.1 kg) IBW/kg (Calculated) : 50 Adjusted Body Weight:   Vital Signs: Temp: 97.4 F (36.3 C) (05/02 1045) Temp src: Oral (05/02 1045) BP: 77/34 mmHg (05/02 1100) Pulse Rate: 101 (05/02 1110) Intake/Output from previous day: 05/01 0701 - 05/02 0700 In: 720 [P.O.:720] Out: 450 [Urine:450]  Labs:  Recent Labs  02/18/13 0515 02/19/13 0510 02/20/13 0515 02/20/13 0640  WBC 9.2  --   --   --   HGB 9.4*  --   --   --   PLT 130*  --   --   --   CREATININE 1.05 1.22 1.76* 1.84*   Estimated Creatinine Clearance: 26.1 ml/min (by C-G formula based on Cr of 1.84).    Microbiology: Recent Results (from the past 720 hour(s))  URINE CULTURE     Status: None   Collection Time    01/22/2013  4:55 PM      Result Value Range Status   Specimen Description URINE, CATHETERIZED   Final   Special Requests NONE   Final   Culture  Setup Time 02/01/2013 21:38   Final   Colony Count NO GROWTH   Final   Culture NO GROWTH   Final   Report Status 02/16/2013 FINAL   Final    Medications:  Anti-infectives   Start     Dose/Rate Route Frequency Ordered Stop   02/19/13 1000  doxycycline (VIBRA-TABS) tablet 100 mg  Status:  Discontinued     100 mg Oral Every 12 hours 02/18/13 1456 02/20/13 1124   02/16/13 1300  vancomycin (VANCOCIN) IVPB 750 mg/150 ml premix  Status:  Discontinued     750 mg 150 mL/hr over 60 Minutes Intravenous Every 24 hours 02/09/2013 1646 02/16/13 0957   02/16/13 1200  vancomycin  (VANCOCIN) 1,250 mg in sodium chloride 0.9 % 250 mL IVPB  Status:  Discontinued     1,250 mg 166.7 mL/hr over 90 Minutes Intravenous Every 24 hours 02/16/13 0958 02/18/13 1456   02/02/2013 1230  vancomycin (VANCOCIN) IVPB 1000 mg/200 mL premix     1,000 mg 200 mL/hr over 60 Minutes Intravenous  Once 02/06/2013 1217 02/03/2013 1411     Assessment: 87 yom admit 4/27 w/ RLE cellulitis, likely from toenail trimming.  Initially started on Vancomycin per pharmacy dosing on 4/27.  Therapy was de-escalated to Doxycyline alone on 5/1 due to clinical improvement.  On 5/2, rapid response was called for hypotension (on milrinone drip for acute on chronic systolic CHF) and pt is transferred to ICU.  Broad spectrum antibiotics re-initiated d/t shock.  Pharmacy asked to assist with dosing of Vancomycin and Cefepime.  Doxycycline d/c.  SCr has steadily increased during the course of admission (1.1 >>> 1.84), CrCl ~ 26 ml/min.  Afebrile,  No recent WBC  Goal of Therapy:  Vancomycin trough level 15-20 mcg/ml  Plan:   Cefepime 1g IV q24h  Vancomycin 1g IV q24h.  Measure Vanc trough at steady state.  Follow up renal fxn and culture results.  Lynann Beaver PharmD, BCPS Pager [phone] 02/20/2013 11:48 AM

## 2013-02-20 NOTE — Progress Notes (Signed)
[facility] progress note. Chief complaint. Hypotension. History of present illness. This 79 year old male in hospital with right lower extremity cellulitis, acute on chronic congestive heart failure with ischemic cardiomyopathy, renal insufficiency, coronary artery disease, etc. The patient is felt to be probably end-stage congestive heart failure and is followed by palliative care/hospice. He is being treated with aggressive diuresis with Lasix, Aldactone, and milrinone drip. He was noted by the nursing staff to be hypotensive with blood pressure 70/40. Patient remains alert and with no acute distress. His only complaint is that his jaw muscles are sore. Vital signs. Temperature 90.8, pulse 92, respiration 28, blood pressure 70/40. O2 sats 98%. General appearance. Frail appearing elderly male who is alert and cooperative. Cardiac. Rate and rhythm regular. No significant edema. There is some jugular venous distention. Lungs. Breath sounds are clear. I appreciate no crackles. Abdomen. Soft and protuberant with positive bowel sounds. Percussible fluid likely ascites. Impression/plan. Problem #1. Hypotension. Probably secondary to overdiuresis. Asked staff to hold the a.m. dosing of Lasix. I've also discontinued  milrinone drip. We'll give the patient to a small bolus of normal saline with 250 cc. Nursing will follow blood pressures and update me as needed.

## 2013-02-20 NOTE — Progress Notes (Signed)
CRITICAL VALUE ALERT  Critical value received:  K 6.8  Date of notification: 5/2  Time of notification:  0739  Critical value read back:yes  Nurse who received alert:  Casilda Carls RN   MD notified (1st page):  A Hongalgi  Time of first page:  0739  Responding MD:  A Hongalgi  Time MD responded: [id number]

## 2013-02-20 NOTE — Telephone Encounter (Signed)
Left message with call center  Dr. Patty Sermons would be attending

## 2013-02-20 NOTE — Progress Notes (Signed)
PT Cancellation Note  Patient Details Name: RIVAN SIORDIA MRN: [id number] DOB: 1925-02-15   Cancelled Treatment:    Reason Eval/Treat Not Completed: Medical issues which prohibited therapy. Will follow.    [name] 02/20/2013, 12:09 PM [phone]

## 2013-02-20 NOTE — Progress Notes (Signed)
At this time patients blood pressure is 72/40 and pulse is 82. Patient has had 2 more 250 NS boluses and still has not urinated. Bladder scan shows 37cc of urine.

## 2013-02-20 NOTE — Progress Notes (Signed)
   Subjective:  Denies CP; dyspnea persists; some epigastric pain.    Objective:  Filed Vitals:   02/20/13 0200 02/20/13 0430 02/20/13 0557 02/20/13 0644  BP: 88/44 72/40 69/34  74/38  Pulse:      Temp:   98.4 F (36.9 C)   TempSrc:   Oral   Resp:  22 23   Height:      Weight:   181 lb (82.1 kg)   SpO2:  98% 98%     Intake/Output from previous day:  Intake/Output Summary (Last 24 hours) at 02/20/13 0653 Last data filed at 02/19/13 1800  Gross per 24 hour  Intake    720 ml  Output    450 ml  Net    270 ml    Physical Exam: Physical exam: Well-developed frail in no acute distress.  Skin is warm and dry.  HEENT is normal.  Neck is supple.  Chest with mildly diminished BS bases; mild crackles Cardiovascular exam is regular rate and rhythm. 2/6 systolic murmur LSB Abdominal exam-hernia Extremities show cellulitis RLE (improving); diffuse anasarca (edema noted predominantly in hips and abdominal wall neuro grossly intact    Lab Results: Basic Metabolic Panel:  Recent Labs  40/98/11 0510 02/20/13 0515  NA 127* 123*  K 4.5 6.8*  CL 95* 93*  CO2 27 26  GLUCOSE 98 98  BUN 27* 35*  CREATININE 1.22 1.76*  CALCIUM 8.1* 7.8*   CBC:  Recent Labs  02/18/13 0515  WBC 9.2  HGB 9.4*  HCT 27.8*  MCV 70.2*  PLT 130*     Assessment/Plan:  1 acute on chronic systolic congestive heart failure-the patient remains markedly volume overloaded; he is hypotensive with continued diuresis and milrinone. Will add renal dose dopamine. Change Lasix to 40 mg IV twice a day. DC spironolactone. Some of his edema is clearly related to low albumin/oncotic pressure. Therefore diuresis will be difficult. DC ARB given hypotension. Will hold on beta-blockade given acute CHF (add later as CHF improves if BP allows). Patient would like all medical measures to optimize if possible and then will be DCed with hospice. Prognosis poor. 2 cellulitis-continue antibiotics. Management per primary  care. Improving. 3 cardiac cirrhosis-patient is followed by gastroenterology. 4 CAD- continue aspirin.  5 history of paroxysmal atrial fibrillation-the patient remains in sinus rhythm. He is not a Coumadin candidate. 6 K 6.8 but hemolyzed; repeat pending.  Olga Millers 02/20/2013, 6:53 AM

## 2013-02-20 NOTE — Progress Notes (Signed)
Pts bp 72/40 and HR 86. Patients oxygen 98 on 2L. Primacor drip stopped. Rapid response RN and NP on call paged. Also, paged [facility] cardiology. Orders to leave drip off and give 250cc NS bolus. MD also notified that patient has not urinated since foley was removed but no urine in bladder according to bladder scan. Will continue to monitor patient.

## 2013-02-20 NOTE — Progress Notes (Signed)
Patient placed on 50% venturi mask to maintain O2 sats of 90% or greater.  Maintained for hours then placed on 100% non-breather to maintain O2 sats 90% or greater.  1630 no longer able to maintain O2 sats 90% or greater with 100% non-rebreather.  Notified Dr. Ladona Ridgel.  Family does not want to escalate to bipap.  Patient shows no signs of pain or discomfort.  Resting quietly and speaking occasionally.  Patient still requiring dopamine drip to maintain systolic bp of 85 to 95.  Will continue to monitor patient.  Emotional support provided to patient and family.

## 2013-02-20 NOTE — Progress Notes (Signed)
Patient XB:JYNW H Carriero      DOB: April 15, 1925      GNF:[id number]  Patient's respiratory status has declined over the course of the day.  He is now requiring 100% NRB.  He has become more anxious.  He asked is he dying and I gently let him know that he likely was .   He accepted this.  I assured him that we would help him be more comfortable.  His family has approved a small dose of ativan despite his sensitivities. They also recognize that the dopamine is likely prolonging his life in a way that is not constructive .  They are ok with slowly decreasing that to allow him to pass as comfortably as he can.  They are going to go and feed their dog and shut their business down and when they return we will start weaning the pressor.  I will discontinue all labs, and oral medication.  Will keep antibiotics for now as they seem to be helping the pain in his leg. Later tonight we will make the adjustments to full comfort.  He can not have opiates and so if he becomes for agitated or more dyspneic I would initiate an ativan drip at 1 mg/hr.    I have updated Elink and discussed this with Dr. Waymon Amato.   Time 615-649 pm  Glennda Weatherholtz L. Ladona Ridgel, MD MBA The Palliative Medicine Team at [facility] Phone: [phone] Pager: [phone]

## 2013-02-20 NOTE — Progress Notes (Signed)
Patient Trevor [name]:VOZD H Brayfield      DOB: December 01, 1924      GUY:[id number]   Palliative Medicine Team at [facility] Progress Note    Subjective: Patient has taken a turn for the worst.  Over night developed worsening shortness of breath, and hypotension.  Family at this time requesting at lease vasopressor support, continue antibiotics which I recommend be broadened.  Patient likely near death . Family informed of gravity of situation.  Patient currently intermittently responsive.  Informed him that his hear is not doing well and he wrinkled his nose up in protest.   Having periods of apnea.  Pain he states is manageable.  He can not have opiates due to respiratory arrest .  Spouse open to using benzodiazepines if needed.  Family plans to see how he does with current interventions and the decided on whether we can move to more full comfort mode. Dopamine started in peripheral line for now.  Team trying not to place CTL  .    Physical exam: General intermittently responsive, able to speak in choppy sentences Chest : coarse bilateral rhonchi CVS:  Regular, S1, S2 Abd: distended firm, positive bowel sounds Ext: right leg remains very red with petechial skin changes Neuro: intermittently awake   Lab Results  Component Value Date   CREATININE 1.84* 02/20/2013   BUN 35* 02/20/2013   NA 123* 02/20/2013   K 6.8* 02/20/2013   CL 95* 02/20/2013   CO2 24 02/20/2013   Lab Results  Component Value Date   WBC 9.2 02/18/2013   HGB 9.4* 02/18/2013   HCT 27.8* 02/18/2013   MCV 70.2* 02/18/2013   PLT 130* 02/18/2013     Assessment and plan: 79 yr old white male with severe end stage heart failure admitted with right leg cellulitis but has developed worsened cardiogenic shock symptoms.  1.  No CPR, No intubation.  Pressors being used for now  2.  Pain: relatively managed at this time patient is intolerant to opiates will continue to monitor   3.  Leg Cellulitis: broaden antibiotic coverage incase this is  contributing to shock.   Total Time: 30 min  Norvel Wenker L. Ladona Ridgel, MD MBA The Palliative Medicine Team at [facility] Phone: [phone] Pager: [phone]

## 2013-02-20 NOTE — Progress Notes (Addendum)
Rapid Response called to room 1435 at 0425, Chief complaint, sbp in 60s with primacor gtt. RN advised to keep gtt off via telephone. Upon my arrival to room, RN on phone with Benedetto Coons. 250 ml bolus ordered and given. Lungs clear. Pt bp 72/29, hr 82 SR, 94% spo02 on 3LNC at  0430. Pt lethargic, follows commands, complains of fatigue denies pain. [facility] MD called per Celso Sickle. Agree with plan of care per Benedetto Coons and Cardiology to continue bolus and monitor BP d/c primacor gtt. Pt DNR, plans to d/c on hospice tomorrow possibly.

## 2013-02-20 NOTE — Telephone Encounter (Signed)
New problem    patient discharge from hospital over the weekend. Hospice will be coming into the home as well.      1. Will Dr. Patty Sermons be patient attending.

## 2013-02-20 NOTE — Progress Notes (Signed)
CRITICAL VALUE ALERT  Critical value received:  K 6.8  Date of notification:  5/2  Time of notification:  0615  Critical value read back:yes  Nurse who received alert:  Casilda Carls RN   MD notified (1st page):  Benedetto Coons NP  Time of first page: 0621  Responding MD:  Benedetto Coons NP   Time MD responded:  [phone]

## 2013-02-21 DIAGNOSIS — N289 Disorder of kidney and ureter, unspecified: Secondary | ICD-10-CM

## 2013-02-23 ENCOUNTER — Ambulatory Visit: Payer: Medicare Other | Admitting: Cardiology

## 2013-02-23 ENCOUNTER — Other Ambulatory Visit: Payer: Medicare Other

## 2013-03-22 NOTE — Discharge Summary (Signed)
Death Summary  Trevor [name] ZOX:[id number] DOB: 1925/03/20 DOA: 2013/02/24  PCP: Cassell Clement, MD PCP/Office notified: 02/23/2013  Admit date: 2013/02/24 Date of Death: Mar 02, 2013  Final Diagnoses:  Principal Problem:   Shock- ? Cardiogenic/hypovolemic Active Problems:   Ischemic heart disease   Hypercholesterolemia   Iron deficiency anemia   Hx of bladder cancer   Congestive heart failure with left ventricular systolic dysfunction   Ischemic cardiomyopathy   HTN (hypertension)   COPD (chronic obstructive pulmonary disease)   Atrial fibrillation   Cellulitis of leg, right   Transaminitis   Hyperbilirubinemia   Hyponatremia   Acute renal insufficiency   Jaundice of recent onset   Ascites   Cirrhosis of liver - by CT and clinical findings   Acute exacerbation of CHF (congestive heart failure)   Cardiac cirrhosis   Systolic CHF, acute on chronic   Palliative care patient   Hyperkalemia    1. Right leg cellulitis 2. Acute on chronic Systolic CHF 3. Ischemic Cardiomyopathy 4. Cardiac Cirrhosis 5. Acute Renal Failure 6. Hyperkalemia 7. Shock 8. Hyponatremia 9. CAD 10. PAF 11. Anemia 12. Thrombocytopenia  History of present illness:  79 year old male patient with history of CAD, remote MI, cardiogenic shock, chronic systolic CHF, HTN, HL, iron deficiency anemia was admitted on 2013/02/24 with worsening right lower extremity erythema and pain. He recently had intervention by his podiatrist on right first toenail. In the ED, Dopplers were negative for DVT. He was admitted for RLE cellulitis, abnormal LFTs and hyponatremia.   Hospital Course:  Patient was admitted to the hospital and started on IV vancomycin for right leg cellulitis. Once his cellulitis had improved, he was transitioned to oral doxycycline. [facility] gastroenterology consulted for his abnormal LFTs and jaundice. They opined that he had cardiac cirrhosis without options for any further treatment. Cardiology  consulted for acute on chronic systolic heart failure secondary to ischemic cardiomyopathy and paroxysmal A. fib. He was placed on high-dose diuretics. Initially he made small improvement. Cardiology recommended palliative care consultation given his multiple comorbidities, advanced CHF and reversible liver disease. Palliative care consulted and he opted for DO NOT RESUSCITATE but wished to continue medical treatment for cellulitis and CHF with the hope to return home with hospice. Milrinone drip was started. Within 24 hours, patient became hypotensive, went into acute renal failure, worsening hyponatremia and hyperkalemia. Family wished to persist with medical treatment. Patient was started on dopamine drip and transferred to the intensive care unit. Despite aggressive medical treatment, patient continued to rapidly decline. Palliative care team continued to coordinate care with family and finally patient and family opted for full comfort care. His dopamine drip was gradually reduced. He was started on Ativan drip for comfort. Opioids could not be used secondary to his allergies. Patient eventually demised at 2:12 AM on 03-02-2013.  Time: 30 minutes  Signed:  Yetta Marceaux  [facility] Mar 02, 2013, 7:21 AM

## 2013-03-22 NOTE — Progress Notes (Signed)
Time of death 0212. NP Claiborne Billings notified. Family at bedside. Confirmed by 2 RNs [facility] notified, referral number [id number]. Pt's body prepared for morgue/funeral home. Awaiting undertaker to remove body. Asystole strip in chart.    Trevor [name]

## 2013-03-22 DEATH — deceased

## 2013-05-18 IMAGING — CR DG CHEST 1V PORT
1 series · 1 of 1 positions shown · non-contrast
Comparison: 04/27/2012

CLINICAL DATA: Bilateral bronchi.

PORTABLE CHEST - 1 VIEW

[AP]
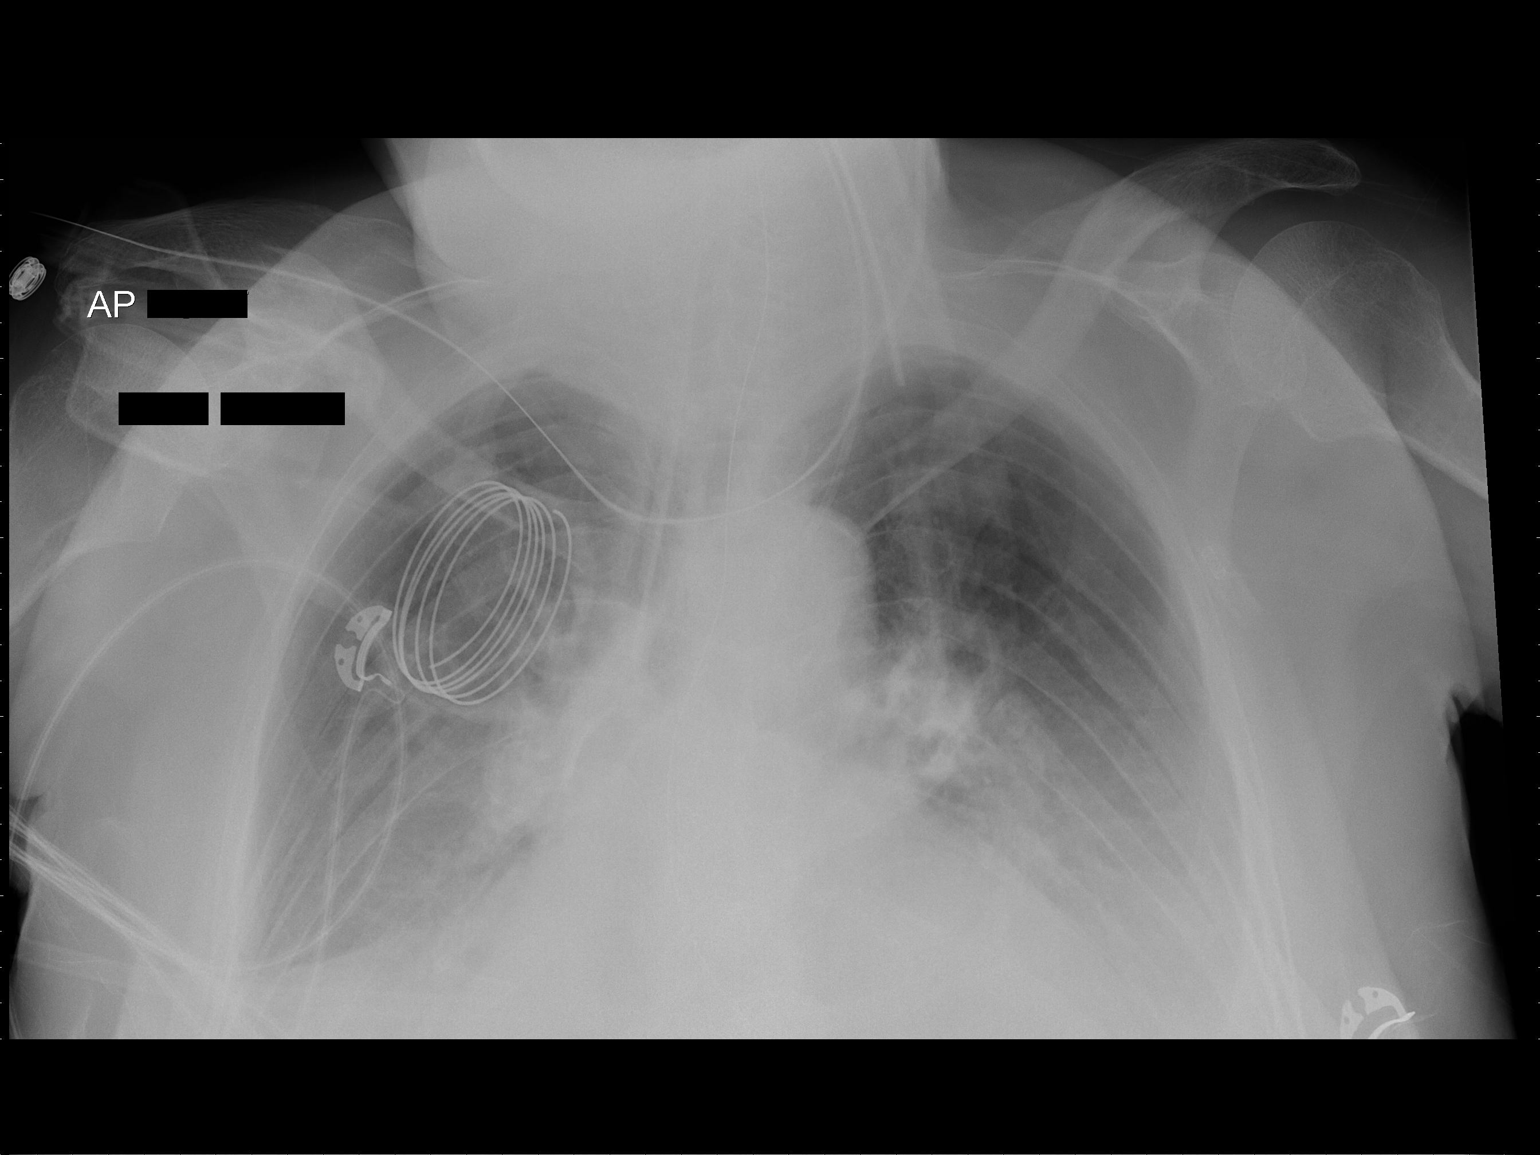

[1 of 1 positions shown; findings below may reference images not displayed]

FINDINGS: Reverse lordotic positioning noted with endotracheal tube
thought to be about 5 mm above the carina.  Consider retraction of
2 cm.

Nasogastric tube noted entering the stomach.  Low lung volumes
present.

Stable bibasilar airspace opacities obscure the hemidiaphragms.
Underlying cardiomegaly noted.

Left IJ line tip projects over the SVC.
IMPRESSION: 1.  Endotracheal tube tip is just above the carina.  Retraction of
2 cm is recommended.
2.  Cardiomegaly with bibasilar airspace opacities suspicious for
pleural effusions and atelectasis.

## 2013-05-19 IMAGING — CR DG CHEST 1V PORT
1 series · 1 of 1 positions shown · non-contrast
Comparison: 04/28/2012.

CLINICAL DATA: Short of breath.

PORTABLE CHEST - 1 VIEW

[AP]
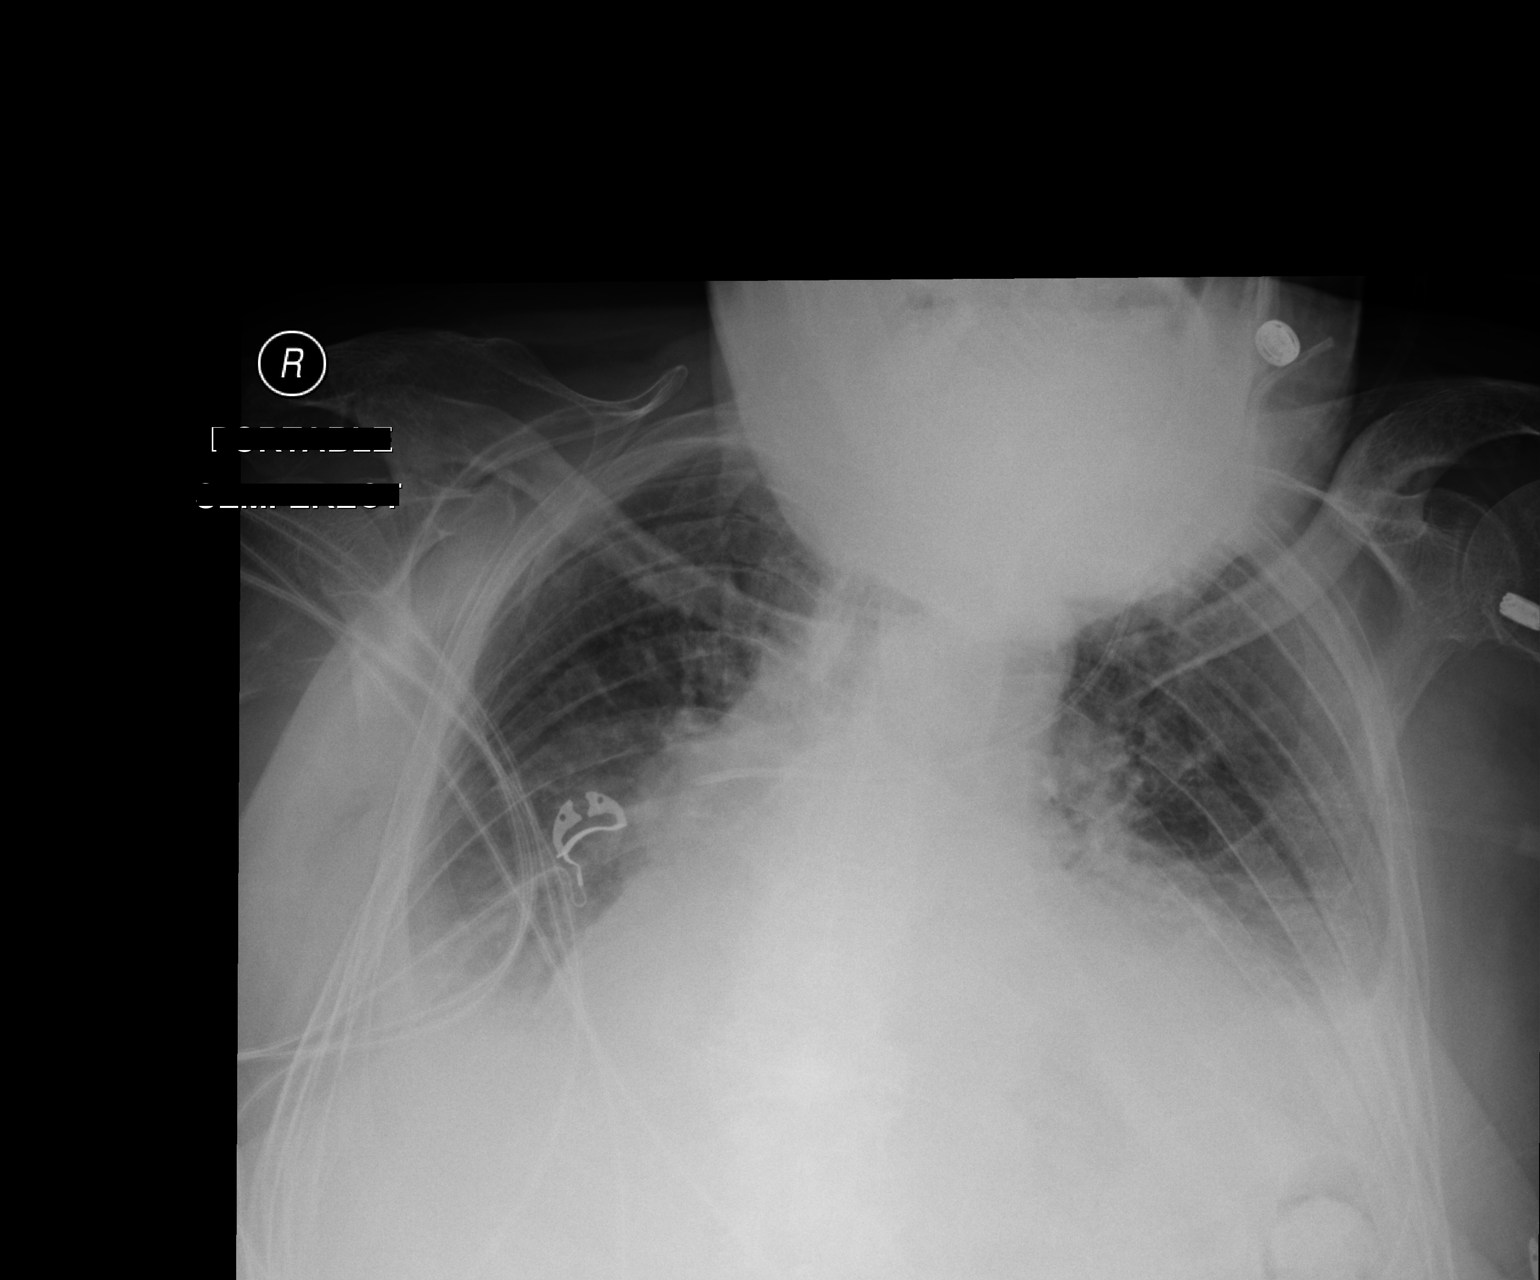

[1 of 1 positions shown; findings below may reference images not displayed]

FINDINGS: Low volume chest.  Left IJ central line remains present.
The tip is at the confluence of the brachiocephalic veins.  Patient
is kyphotic, producing a lordotic projection.  Bilateral left
greater than right pleural effusion with basilar atelectasis.
Cardiomegaly.  Mediastinal contours unchanged allowing for
technique.

Compared to prior, the enteric tube and endotracheal tube has been
removed.
IMPRESSION: 1.  Interval removal of endotracheal tube and enteric tube.
2.  Left IJ central line persists.
3.  Unchanged pulmonary aeration with left greater than right
basilar atelectasis, airspace disease and effusion.

## 2013-05-21 IMAGING — CR DG CHEST 1V PORT
1 series · 1 of 1 positions shown · non-contrast
Comparison: Chest x-rays dated [DATE], [DATE], [DATE], and 04/27/2012

CLINICAL DATA: Congestive heart failure.

PORTABLE CHEST - 1 VIEW

[AP]
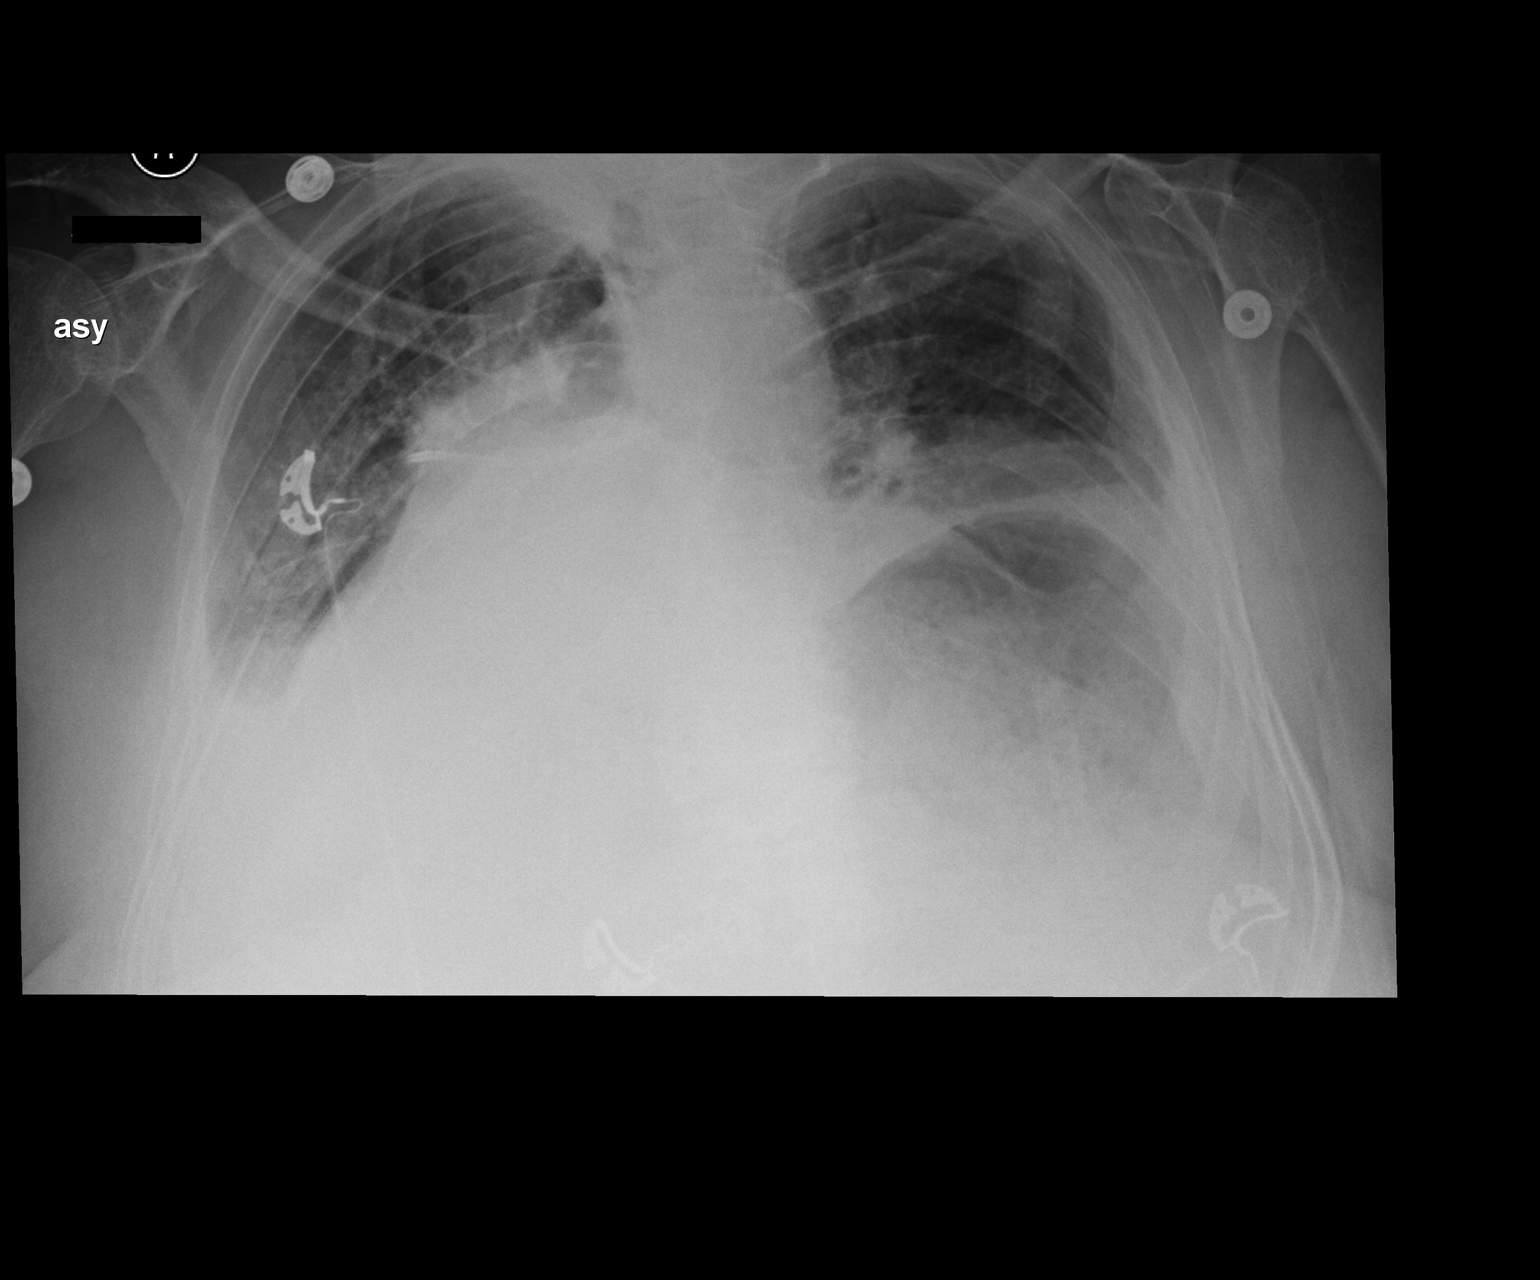

[1 of 1 positions shown; findings below may reference images not displayed]

FINDINGS: Chronic cardiomegaly.  Increased elevation of the left
hemidiaphragm.

The pulmonary vascularity is now normal.  There is a moderate right
pleural effusion with atelectasis at the right lung base,
unchanged.  The patient does have a severe  compression fracture in
the mid thoracic spine, new since 07/20/2011.
IMPRESSION: Resolving interstitial edema.  Pulmonary vascularity is now normal.

Persistent right effusion and atelectasis.

Compression fracture of the mid thoracic spine is new since
07/20/2011.

## 2013-05-26 IMAGING — CR DG CHEST 1V PORT
1 series · 1 of 1 positions shown · non-contrast
Comparison: 05/06/2012 [DATE] a.m..

CLINICAL DATA: Post endotracheal tube placement.

PORTABLE CHEST - 1 VIEW

[AP]
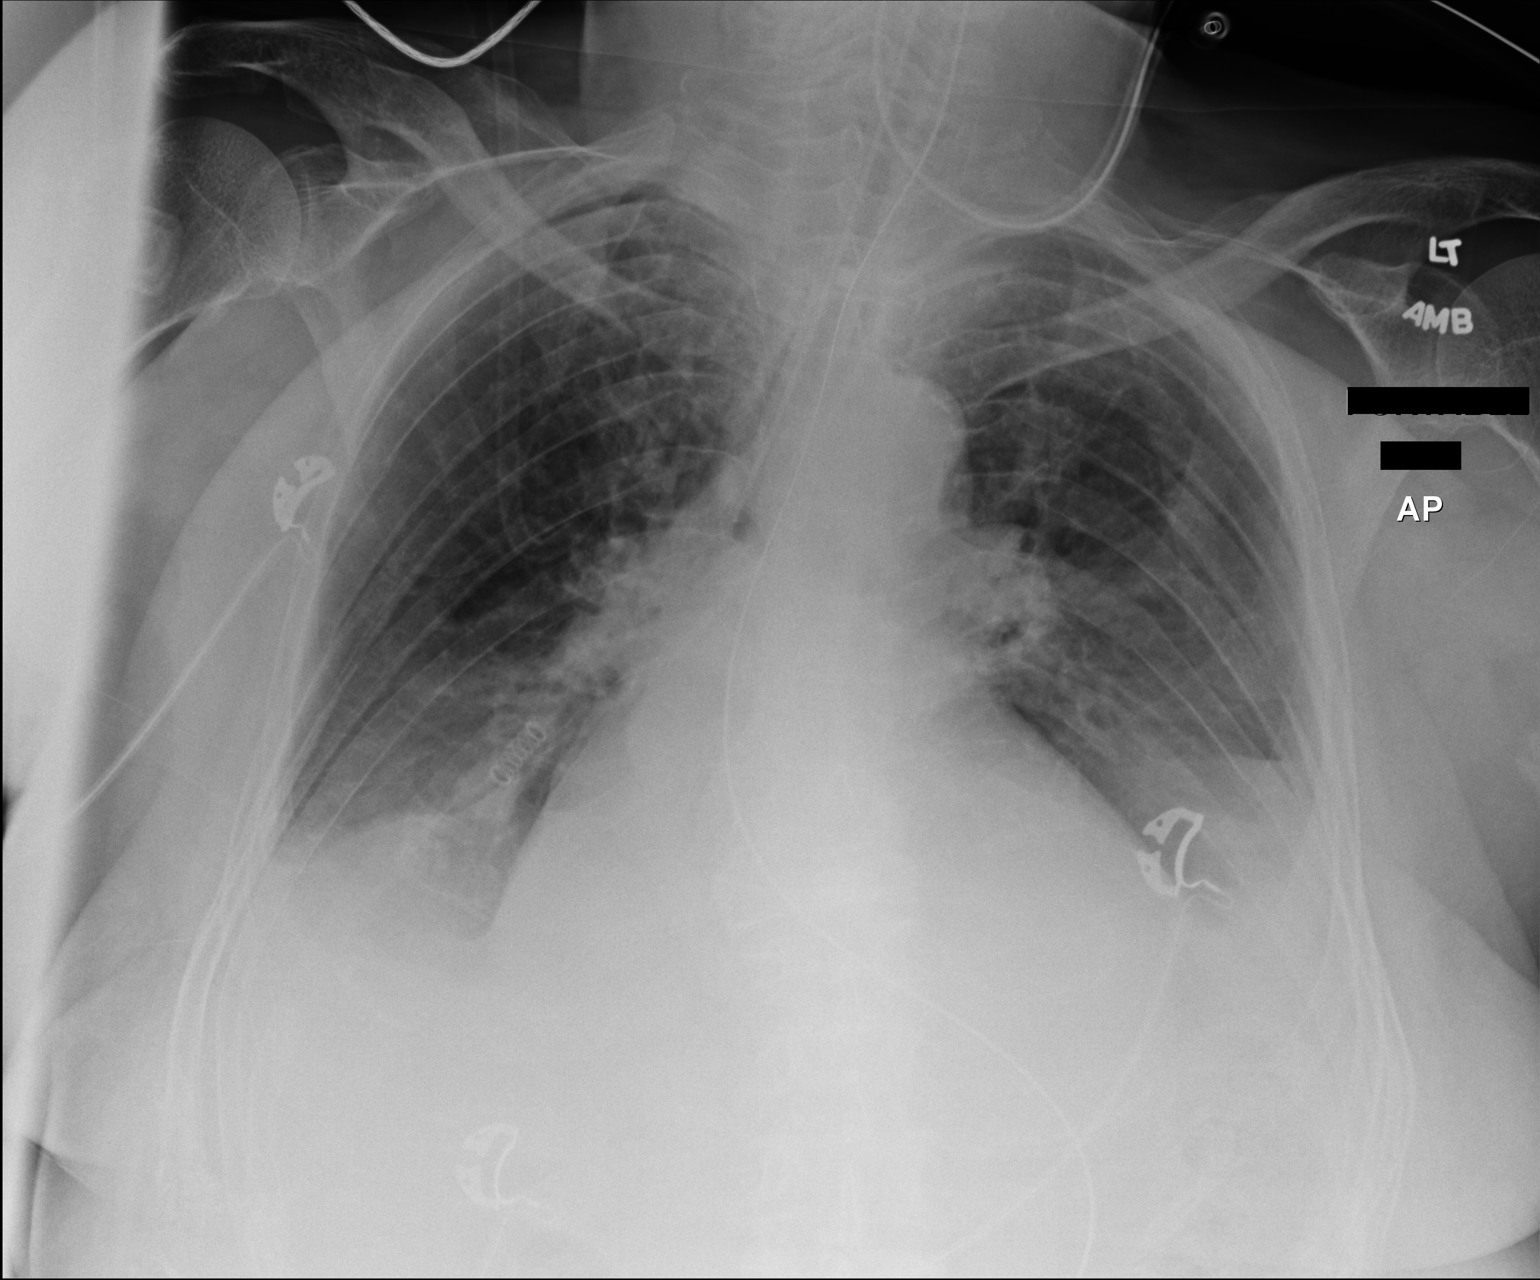

[1 of 1 positions shown; findings below may reference images not displayed]

FINDINGS: Endotracheal tube tip enters the right mainstem bronchus.
Recommend retracting by 3.5 cm.

Nasogastric tube courses below the diaphragm.  The tip is not
included on this exam..

Rotation to the left.  Cardiomegaly.  Pulmonary vascular
congestion/pulmonary edema.  Opacification mid to lower lung zones
may represent pleural fluid and atelectasis, infiltrate not
excluded.

No gross pneumothorax.

Calcified aorta.
IMPRESSION: [Endotracheal tube tip enters the right mainstem bronchus.
Recommend retracting by 3.5 cm.

Cardiomegaly.

Pulmonary vascular congestion/pulmonary edema.  Opacification mid
to lower lung zones may represent pleural fluid and atelectasis,
infiltrate not excluded.

Critical Value/emergent results were called by telephone at the
time of interpretation on 05/06/2012 at [DATE] to [place] patient's
nurse., who verbally acknowledged these results.

## 2013-05-26 IMAGING — CR DG CHEST 1V PORT
1 series · 1 of 1 positions shown · non-contrast
Comparison: 3234 hours

CLINICAL DATA: Respiratory failure.  Status post retraction of
endotracheal tube.

PORTABLE CHEST - 1 VIEW

[AP]
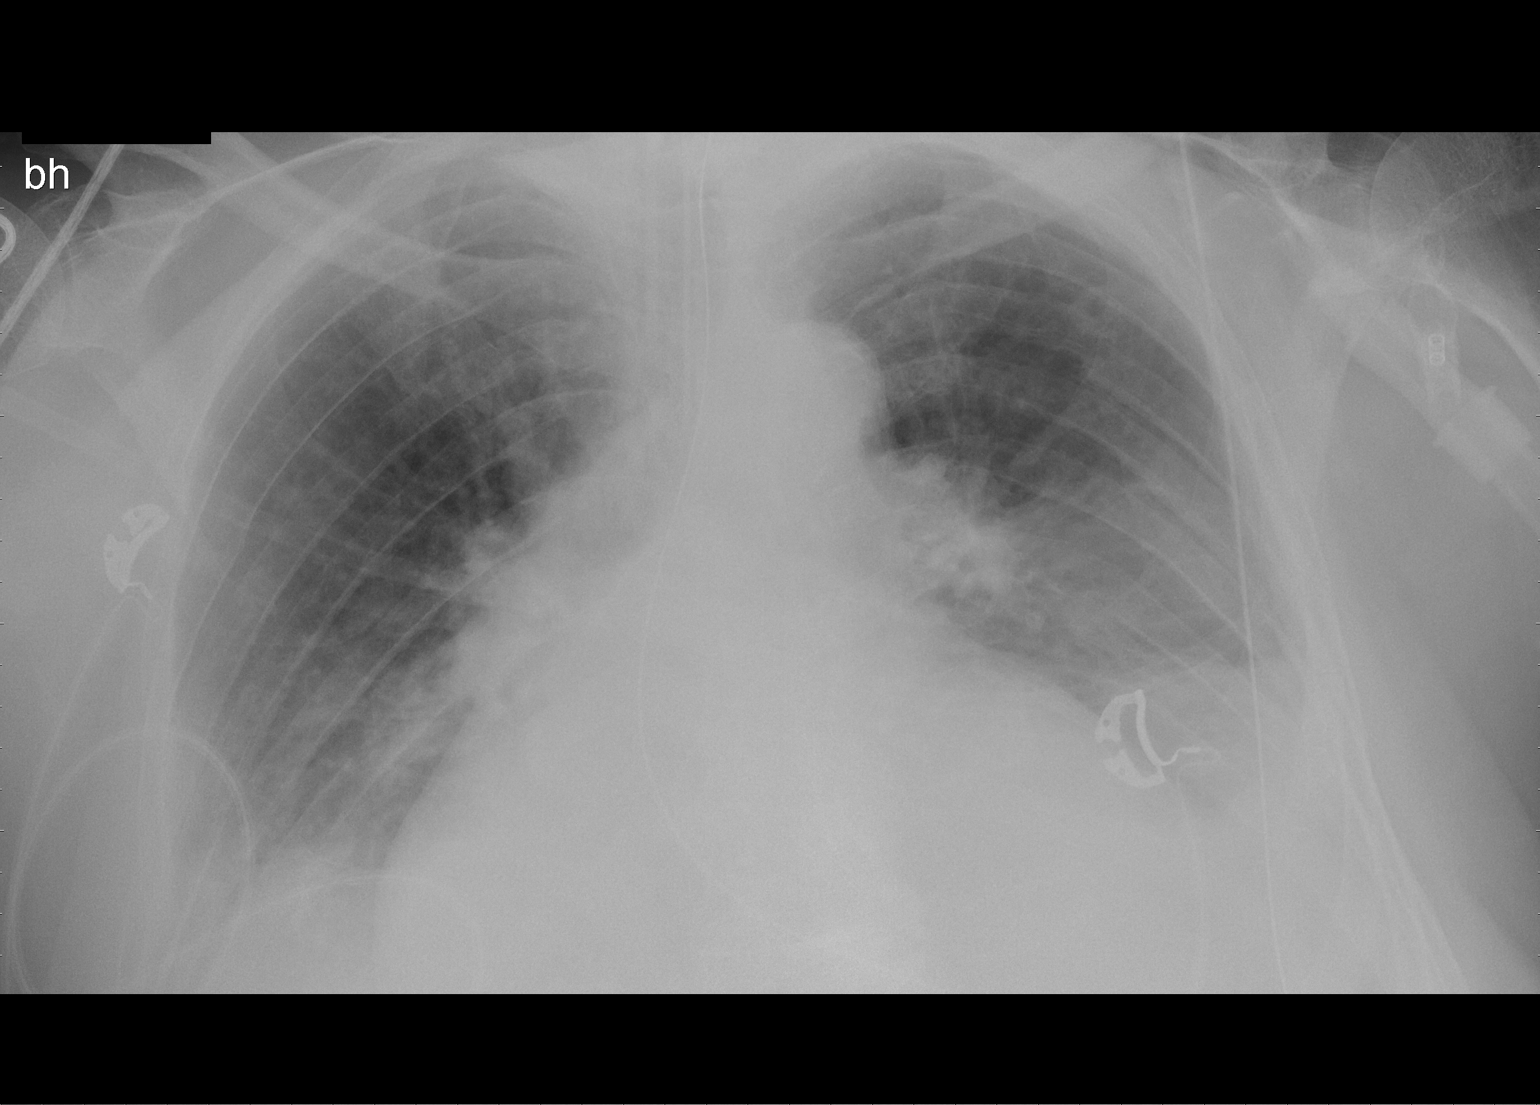

[1 of 1 positions shown; findings below may reference images not displayed]

FINDINGS: Film at 5229 hours demonstrates retraction of the
endotracheal tube with the tip now approximately 1 cm above the
carina.  Degree of residual pulmonary edema and bilateral pleural
effusions is stable.  Stable cardiomegaly.
IMPRESSION: Endotracheal tube tip now approximately 1 cm above the carina.
Stable pulmonary edema and pleural effusions.

## 2014-08-06 NOTE — Telephone Encounter (Signed)
Nothing was typed/tmj
